# Patient Record
Sex: Male | Born: 1940
Health system: Southern US, Community
[De-identification: ages and names within clinical notes are randomized; demographics above are authoritative.]

## PROBLEM LIST (undated history)

## (undated) DIAGNOSIS — H269 Unspecified cataract: Secondary | ICD-10-CM

## (undated) DIAGNOSIS — I255 Ischemic cardiomyopathy: Secondary | ICD-10-CM

## (undated) DIAGNOSIS — E785 Hyperlipidemia, unspecified: Secondary | ICD-10-CM

## (undated) DIAGNOSIS — F329 Major depressive disorder, single episode, unspecified: Secondary | ICD-10-CM

## (undated) DIAGNOSIS — I10 Essential (primary) hypertension: Secondary | ICD-10-CM

## (undated) DIAGNOSIS — T7840XA Allergy, unspecified, initial encounter: Secondary | ICD-10-CM

## (undated) DIAGNOSIS — G4700 Insomnia, unspecified: Secondary | ICD-10-CM

## (undated) DIAGNOSIS — I2510 Atherosclerotic heart disease of native coronary artery without angina pectoris: Secondary | ICD-10-CM

## (undated) DIAGNOSIS — I4891 Unspecified atrial fibrillation: Secondary | ICD-10-CM

## (undated) DIAGNOSIS — R0981 Nasal congestion: Secondary | ICD-10-CM

## (undated) DIAGNOSIS — F32A Depression, unspecified: Secondary | ICD-10-CM

## (undated) HISTORY — DX: Essential (primary) hypertension: I10

## (undated) HISTORY — DX: Major depressive disorder, single episode, unspecified: F32.9

## (undated) HISTORY — PX: CORONARY ANGIOPLASTY: SHX604

## (undated) HISTORY — DX: Unspecified atrial fibrillation: I48.91

## (undated) HISTORY — PX: OTHER SURGICAL HISTORY: SHX169

## (undated) HISTORY — DX: Atherosclerotic heart disease of native coronary artery without angina pectoris: I25.10

## (undated) HISTORY — DX: Hyperlipidemia, unspecified: E78.5

## (undated) HISTORY — DX: Insomnia, unspecified: G47.00

## (undated) HISTORY — DX: Depression, unspecified: F32.A

## (undated) HISTORY — PX: ROTATOR CUFF REPAIR: SHX139

## (undated) HISTORY — PX: APPENDECTOMY: SHX54

## (undated) HISTORY — DX: Nasal congestion: R09.81

## (undated) HISTORY — DX: Allergy, unspecified, initial encounter: T78.40XA

## (undated) HISTORY — DX: Ischemic cardiomyopathy: I25.5

## (undated) HISTORY — DX: Unspecified cataract: H26.9

---

## 2004-05-16 ENCOUNTER — Inpatient Hospital Stay (HOSPITAL_COMMUNITY): Admission: EM | Admit: 2004-05-16 | Discharge: 2004-05-19 | Payer: Self-pay | Admitting: Emergency Medicine

## 2004-06-14 ENCOUNTER — Ambulatory Visit: Payer: Self-pay | Admitting: Cardiology

## 2004-07-12 ENCOUNTER — Ambulatory Visit: Payer: Self-pay | Admitting: Internal Medicine

## 2004-08-17 ENCOUNTER — Ambulatory Visit: Payer: Self-pay | Admitting: Internal Medicine

## 2004-10-15 ENCOUNTER — Ambulatory Visit: Payer: Self-pay | Admitting: Internal Medicine

## 2004-10-22 ENCOUNTER — Ambulatory Visit: Payer: Self-pay | Admitting: Internal Medicine

## 2005-01-22 ENCOUNTER — Ambulatory Visit: Payer: Self-pay | Admitting: Internal Medicine

## 2005-04-24 ENCOUNTER — Ambulatory Visit: Payer: Self-pay | Admitting: Internal Medicine

## 2005-09-02 ENCOUNTER — Ambulatory Visit: Payer: Self-pay | Admitting: Internal Medicine

## 2005-10-17 ENCOUNTER — Ambulatory Visit: Payer: Self-pay | Admitting: Internal Medicine

## 2006-01-21 ENCOUNTER — Ambulatory Visit: Payer: Self-pay | Admitting: Internal Medicine

## 2006-05-01 ENCOUNTER — Ambulatory Visit: Payer: Self-pay | Admitting: Internal Medicine

## 2006-06-05 ENCOUNTER — Ambulatory Visit: Payer: Self-pay | Admitting: Internal Medicine

## 2006-07-18 ENCOUNTER — Ambulatory Visit: Payer: Self-pay | Admitting: Internal Medicine

## 2006-08-07 ENCOUNTER — Ambulatory Visit: Payer: Self-pay | Admitting: Internal Medicine

## 2006-09-25 ENCOUNTER — Ambulatory Visit: Payer: Self-pay | Admitting: Internal Medicine

## 2006-11-28 ENCOUNTER — Ambulatory Visit: Payer: Self-pay | Admitting: Internal Medicine

## 2006-11-28 LAB — CONVERTED CEMR LAB
ALT: 58 units/L — ABNORMAL HIGH (ref 0–40)
AST: 42 units/L — ABNORMAL HIGH (ref 0–37)
Albumin: 4.1 g/dL (ref 3.5–5.2)
Alkaline Phosphatase: 53 units/L (ref 39–117)
BUN: 11 mg/dL (ref 6–23)
Bilirubin, Direct: 0.1 mg/dL (ref 0.0–0.3)
CO2: 32 meq/L (ref 19–32)
Calcium: 9.8 mg/dL (ref 8.4–10.5)
Chloride: 105 meq/L (ref 96–112)
Cholesterol: 163 mg/dL (ref 0–200)
Creatinine, Ser: 1 mg/dL (ref 0.4–1.5)
GFR calc Af Amer: 96 mL/min
GFR calc non Af Amer: 79 mL/min
Glucose, Bld: 113 mg/dL — ABNORMAL HIGH (ref 70–99)
HDL: 33.8 mg/dL — ABNORMAL LOW (ref >39.0)
LDL Cholesterol: 102 mg/dL — ABNORMAL HIGH (ref 0–99)
Potassium: 5.4 meq/L — ABNORMAL HIGH (ref 3.5–5.1)
Sodium: 143 meq/L (ref 135–145)
TSH: 1.52 microintl units/mL (ref 0.35–5.50)
Total Bilirubin: 1 mg/dL (ref 0.3–1.2)
Total CHOL/HDL Ratio: 4.8
Total Protein: 7.3 g/dL (ref 6.0–8.3)
Triglycerides: 136 mg/dL (ref 0–149)
VLDL: 27 mg/dL (ref 0–40)

## 2007-02-17 DIAGNOSIS — I2510 Atherosclerotic heart disease of native coronary artery without angina pectoris: Secondary | ICD-10-CM

## 2007-02-17 DIAGNOSIS — K760 Fatty (change of) liver, not elsewhere classified: Secondary | ICD-10-CM

## 2007-02-17 DIAGNOSIS — I10 Essential (primary) hypertension: Secondary | ICD-10-CM

## 2007-02-17 DIAGNOSIS — G5600 Carpal tunnel syndrome, unspecified upper limb: Secondary | ICD-10-CM | POA: Insufficient documentation

## 2007-02-17 DIAGNOSIS — E785 Hyperlipidemia, unspecified: Secondary | ICD-10-CM

## 2007-03-03 ENCOUNTER — Ambulatory Visit: Payer: Self-pay | Admitting: Internal Medicine

## 2007-03-03 DIAGNOSIS — G4700 Insomnia, unspecified: Secondary | ICD-10-CM | POA: Insufficient documentation

## 2007-03-03 DIAGNOSIS — F329 Major depressive disorder, single episode, unspecified: Secondary | ICD-10-CM

## 2007-03-03 DIAGNOSIS — R740 Nonspecific elevation of levels of transaminase and lactic acid dehydrogenase [LDH]: Secondary | ICD-10-CM

## 2007-03-03 LAB — CONVERTED CEMR LAB
ALT: 58 U/L — ABNORMAL HIGH
AST: 41 U/L — ABNORMAL HIGH
Albumin: 4.1 g/dL
Alkaline Phosphatase: 50 U/L
Bilirubin, Direct: 0.2 mg/dL
Hgb A1c MFr Bld: 5.8 %
Total Bilirubin: 1.1 mg/dL
Total Protein: 7.1 g/dL

## 2007-06-04 ENCOUNTER — Ambulatory Visit: Payer: Self-pay | Admitting: Internal Medicine

## 2007-06-04 LAB — CONVERTED CEMR LAB
ALT: 33 units/L (ref 0–53)
AST: 36 units/L (ref 0–37)
Albumin: 4.4 g/dL (ref 3.5–5.2)
Alkaline Phosphatase: 58 units/L (ref 39–117)
BUN: 12 mg/dL (ref 6–23)
Bilirubin, Direct: 0.2 mg/dL (ref 0.0–0.3)
CO2: 31 meq/L (ref 19–32)
Calcium: 10 mg/dL (ref 8.4–10.5)
Chloride: 103 meq/L (ref 96–112)
Cholesterol, target level: 200 mg/dL
Cholesterol: 138 mg/dL (ref 0–200)
Creatinine, Ser: 1 mg/dL (ref 0.4–1.5)
GFR calc Af Amer: 96 mL/min
GFR calc non Af Amer: 79 mL/min
Glucose, Bld: 94 mg/dL (ref 70–99)
HDL goal, serum: 40 mg/dL
HDL: 29.3 mg/dL — ABNORMAL LOW (ref >39.0)
LDL Cholesterol: 92 mg/dL (ref 0–99)
LDL Goal: 100 mg/dL
Potassium: 4.9 meq/L (ref 3.5–5.1)
Sodium: 139 meq/L (ref 135–145)
Total Bilirubin: 0.8 mg/dL (ref 0.3–1.2)
Total CHOL/HDL Ratio: 4.7
Total Protein: 7.1 g/dL (ref 6.0–8.3)
Triglycerides: 82 mg/dL (ref 0–149)
VLDL: 16 mg/dL (ref 0–40)

## 2007-09-08 ENCOUNTER — Telehealth (INDEPENDENT_AMBULATORY_CARE_PROVIDER_SITE_OTHER): Payer: Self-pay | Admitting: *Deleted

## 2007-09-15 ENCOUNTER — Ambulatory Visit: Payer: Self-pay | Admitting: Internal Medicine

## 2007-09-15 LAB — CONVERTED CEMR LAB
ALT: 20 units/L (ref 0–53)
AST: 26 units/L (ref 0–37)
Albumin: 3.7 g/dL (ref 3.5–5.2)
Alkaline Phosphatase: 42 units/L (ref 39–117)
Bilirubin, Direct: 0.2 mg/dL (ref 0.0–0.3)
Cholesterol: 123 mg/dL (ref 0–200)
HDL: 33.6 mg/dL — ABNORMAL LOW (ref >39.0)
Hgb A1c MFr Bld: 5.8 % (ref 4.6–6.0)
LDL Cholesterol: 79 mg/dL (ref 0–99)
Total Bilirubin: 0.8 mg/dL (ref 0.3–1.2)
Total CHOL/HDL Ratio: 3.7
Total Protein: 6.4 g/dL (ref 6.0–8.3)
Triglycerides: 52 mg/dL (ref 0–149)
VLDL: 10 mg/dL (ref 0–40)

## 2007-09-22 ENCOUNTER — Ambulatory Visit: Payer: Self-pay | Admitting: Internal Medicine

## 2008-01-28 ENCOUNTER — Ambulatory Visit: Payer: Self-pay | Admitting: Internal Medicine

## 2008-05-26 ENCOUNTER — Ambulatory Visit: Payer: Self-pay | Admitting: Internal Medicine

## 2008-05-26 LAB — CONVERTED CEMR LAB
ALT: 26 units/L (ref 0–53)
AST: 29 units/L (ref 0–37)
Albumin: 4.1 g/dL (ref 3.5–5.2)
Alkaline Phosphatase: 48 units/L (ref 39–117)
BUN: 14 mg/dL (ref 6–23)
Bilirubin, Direct: 0.1 mg/dL (ref 0.0–0.3)
CO2: 30 meq/L (ref 19–32)
Calcium: 9.8 mg/dL (ref 8.4–10.5)
Chloride: 104 meq/L (ref 96–112)
Cholesterol: 148 mg/dL (ref 0–200)
Creatinine, Ser: 1 mg/dL (ref 0.4–1.5)
GFR calc Af Amer: 96 mL/min
GFR calc non Af Amer: 79 mL/min
Glucose, Bld: 98 mg/dL (ref 70–99)
HDL: 34.6 mg/dL — ABNORMAL LOW (ref >39.0)
LDL Cholesterol: 100 mg/dL — ABNORMAL HIGH (ref 0–99)
PSA: 0.51 ng/mL (ref 0.10–4.00)
Potassium: 5.5 meq/L — ABNORMAL HIGH (ref 3.5–5.1)
Sodium: 141 meq/L (ref 135–145)
TSH: 1.03 microintl units/mL (ref 0.35–5.50)
Total Bilirubin: 1.1 mg/dL (ref 0.3–1.2)
Total CHOL/HDL Ratio: 4.3
Total Protein: 7.3 g/dL (ref 6.0–8.3)
Triglycerides: 66 mg/dL (ref 0–149)
VLDL: 13 mg/dL (ref 0–40)

## 2008-06-13 ENCOUNTER — Ambulatory Visit: Payer: Self-pay | Admitting: Internal Medicine

## 2008-12-05 ENCOUNTER — Ambulatory Visit: Payer: Self-pay | Admitting: Internal Medicine

## 2008-12-05 LAB — CONVERTED CEMR LAB
ALT: 22 units/L (ref 0–53)
AST: 26 units/L (ref 0–37)
Albumin: 3.8 g/dL (ref 3.5–5.2)
Alkaline Phosphatase: 37 units/L — ABNORMAL LOW (ref 39–117)
Bilirubin, Direct: 0 mg/dL (ref 0.0–0.3)
Cholesterol: 134 mg/dL (ref 0–200)
HDL: 31.7 mg/dL — ABNORMAL LOW (ref >39.00)
Hgb A1c MFr Bld: 5.7 % (ref 4.6–6.5)
LDL Cholesterol: 89 mg/dL (ref 0–99)
Total Bilirubin: 0.7 mg/dL (ref 0.3–1.2)
Total CHOL/HDL Ratio: 4
Total Protein: 6.9 g/dL (ref 6.0–8.3)
Triglycerides: 65 mg/dL (ref 0.0–149.0)
VLDL: 13 mg/dL (ref 0.0–40.0)

## 2008-12-12 ENCOUNTER — Ambulatory Visit: Payer: Self-pay | Admitting: Internal Medicine

## 2009-03-27 ENCOUNTER — Ambulatory Visit: Payer: Self-pay | Admitting: Internal Medicine

## 2009-03-27 LAB — CONVERTED CEMR LAB
ALT: 23 units/L (ref 0–53)
AST: 28 units/L (ref 0–37)
Albumin: 4 g/dL (ref 3.5–5.2)
Alkaline Phosphatase: 44 units/L (ref 39–117)
Bilirubin, Direct: 0 mg/dL (ref 0.0–0.3)
Total Bilirubin: 0.9 mg/dL (ref 0.3–1.2)
Total Protein: 7 g/dL (ref 6.0–8.3)

## 2009-06-28 ENCOUNTER — Ambulatory Visit: Payer: Self-pay | Admitting: Internal Medicine

## 2009-06-28 LAB — CONVERTED CEMR LAB
ALT: 22 units/L (ref 0–53)
AST: 24 units/L (ref 0–37)
Albumin: 3.8 g/dL (ref 3.5–5.2)
Alkaline Phosphatase: 44 units/L (ref 39–117)
Bilirubin, Direct: 0 mg/dL (ref 0.0–0.3)
Cholesterol: 143 mg/dL (ref 0–200)
Direct LDL: 99.3 mg/dL
HDL: 36.3 mg/dL — ABNORMAL LOW (ref >39.00)
TSH: 0.9 microintl units/mL (ref 0.35–5.50)
Total Bilirubin: 0.8 mg/dL (ref 0.3–1.2)
Total Protein: 7 g/dL (ref 6.0–8.3)

## 2009-10-30 ENCOUNTER — Ambulatory Visit: Payer: Self-pay | Admitting: Internal Medicine

## 2010-03-26 ENCOUNTER — Telehealth: Payer: Self-pay | Admitting: Internal Medicine

## 2010-04-26 ENCOUNTER — Ambulatory Visit: Payer: Self-pay | Admitting: Internal Medicine

## 2010-04-26 LAB — CONVERTED CEMR LAB
ALT: 27 units/L (ref 0–53)
AST: 38 units/L — ABNORMAL HIGH (ref 0–37)
Albumin: 3.8 g/dL (ref 3.5–5.2)
Alkaline Phosphatase: 41 units/L (ref 39–117)
BUN: 16 mg/dL (ref 6–23)
Bilirubin, Direct: 0.2 mg/dL (ref 0.0–0.3)
CO2: 26 meq/L (ref 19–32)
Calcium: 8.8 mg/dL (ref 8.4–10.5)
Chloride: 101 meq/L (ref 96–112)
Cholesterol: 134 mg/dL (ref 0–200)
Creatinine, Ser: 1 mg/dL (ref 0.4–1.5)
GFR calc non Af Amer: 80.27 mL/min (ref >60)
Glucose, Bld: 94 mg/dL (ref 70–99)
HDL: 35.1 mg/dL — ABNORMAL LOW (ref >39.00)
LDL Cholesterol: 89 mg/dL (ref 0–99)
PSA: 0.48 ng/mL (ref 0.10–4.00)
Potassium: 4.4 meq/L (ref 3.5–5.1)
Sodium: 135 meq/L (ref 135–145)
TSH: 0.73 microintl units/mL (ref 0.35–5.50)
Total Bilirubin: 0.7 mg/dL (ref 0.3–1.2)
Total CHOL/HDL Ratio: 4
Total Protein: 6.3 g/dL (ref 6.0–8.3)
Triglycerides: 51 mg/dL (ref 0.0–149.0)
VLDL: 10.2 mg/dL (ref 0.0–40.0)

## 2010-05-03 ENCOUNTER — Ambulatory Visit: Payer: Self-pay | Admitting: Internal Medicine

## 2010-08-30 ENCOUNTER — Ambulatory Visit: Admit: 2010-08-30 | Payer: Self-pay | Admitting: Internal Medicine

## 2010-09-06 NOTE — Progress Notes (Signed)
Summary: infected tooth  Phone Note Call from Patient   Caller: Patient Call For: Stacie Glaze MD Summary of Call: Pt wants an antibiotic for infected tooth. [phone] [facility] Initial call taken by: Lynann Beaver CMA,  March 26, 2010 3:34 PM  Follow-up for Phone Call        amoxil 500 three times a day for 10 days Follow-up by: Stacie Glaze MD,  March 27, 2010 9:42 AM    New/Updated Medications: AMOXICILLIN 500 MG CAPS (AMOXICILLIN) one by mouth three times a day x 10 days Prescriptions: AMOXICILLIN 500 MG CAPS (AMOXICILLIN) one by mouth three times a day x 10 days  #30 x 0   Entered by:   Lynann Beaver CMA   Authorized by:   Stacie Glaze MD   Signed by:   Lynann Beaver CMA on 03/27/2010   Method used:   Electronically to        [facility] (retail)       [address]       [place], [place]  [address]       Ph: [phone]       Fax: [phone]   RxID:   [id number]  Notified pt.

## 2010-09-06 NOTE — Assessment & Plan Note (Signed)
Summary: 6 MTH ROV // RS/PT [facility] FROM BMP/CJR   Vital Signs:  Patient profile:   70 year old male Height:      69 inches Weight:      157 pounds BMI:     23.27 Temp:     98.2 degrees F oral Pulse rate:   64 / minute Resp:     14 per minute BP sitting:   136 / 80  (left arm)  Vitals Entered By: Willy Eddy, LPN (May 03, 2010 10:27 AM) CC: roa labs, Hypertension Management Is Patient Diabetic? No   Primary Care Arbie Reisz:  Stacie Glaze MD  CC:  roa labs and Hypertension Management.  History of Present Illness: folow up of HTN and lipids with weight loss and exercize he is at his ideal weight and feels well he is still on imdue and norvasc and a BB and has not noted any chest discomfort no lightheadedness or dizzyness has persitant sinus problems with allergies and taked as needed loratidine. monitering of LFTs shows improvement weigth loss has been key  Pt also is due the medicare wellness exam Here for Medicare AWV:  1.   Risk factors based on Past M, S, F history:  reveiwed and major risks are CAD due to lipids and HTN, currently acute risks are lower due to bing at goals fro these problems 2.   Physical Activities:  walks up to 8 miles a day 3.   Depression/mood:  no dression detected on questoning 4.   Hearing: heqrs whispered voice at 6 feer 5.   ADL's:  poerforms all activities of daily living 6.   Fall Risk:  none noted and no hx of falls 7.   Home Safety:  no increased risks noted 8.   Height, weight, &visual acuity: in chart 9.   Counseling:  based on CV risks counsiclling about diet, exercise and med abherance his insurance has attempted to change crestor to zocar but this is  not possible due to the amlodipine 10.   Labs ordered based on risk factors:  ordered prior to visit 11.           Referral Coordination  none needed today 12.           Care Plan reviewed immunizations and set ROV see care plan sheet scanned into EMR 13.            Cognitive  Assessment Alert and oriented, good judement, memory intact for short term recall and able to perform simple calcilations such as balancing a check book   Hypertension History:      He denies headache, chest pain, palpitations, dyspnea with exertion, orthopnea, PND, peripheral edema, visual symptoms, neurologic problems, syncope, and side effects from treatment.        Positive major cardiovascular risk factors include male age 26 years old or older, hyperlipidemia, and hypertension.  Negative major cardiovascular risk factors include non-tobacco-user status.        Positive history for target organ damage include ASHD (either angina/prior MI/prior CABG).  Further assessment for target organ damage reveals no history of stroke/TIA or peripheral vascular disease.     Preventive Screening-Counseling & Management  Alcohol-Tobacco     Smoking Status: quit     Year Quit: 1990     Passive Smoke Exposure: no     Tobacco Counseling: to remain off tobacco products  Problems Prior to Update: 1)  Preventive Health Care  (ICD-V70.0) 2)  Elevation, Transaminase/ldh Levels  (  ICD-790.4) 3)  Family History Diabetes 1st Degree Relative  (ICD-V18.0) 4)  Insomnia, Persistent  (ICD-307.42) 5)  Depression  (ICD-311) 6)  Syndrome, Carpal Tunnel  (ICD-354.0) 7)  Fatty Liver Disease  (ICD-571.8) 8)  Hypertension  (ICD-401.9) 9)  Hyperlipidemia  (ICD-272.4) 10)  Coronary Artery Disease  (ICD-414.00)  Current Problems (verified): 1)  Elevation, Transaminase/ldh Levels  (ICD-790.4) 2)  Family History Diabetes 1st Degree Relative  (ICD-V18.0) 3)  Insomnia, Persistent  (ICD-307.42) 4)  Depression  (ICD-311) 5)  Syndrome, Carpal Tunnel  (ICD-354.0) 6)  Fatty Liver Disease  (ICD-571.8) 7)  Hypertension  (ICD-401.9) 8)  Hyperlipidemia  (ICD-272.4) 9)  Coronary Artery Disease  (ICD-414.00)  Medications Prior to Update: 1)  Nadolol 40 Mg  Tabs (Nadolol) .... One By Mouth Daily 2)  Norvasc 5 Mg  Tabs  (Amlodipine Besylate) .... One By Mouth Daily 3)  Crestor 10 Mg  Tabs (Rosuvastatin Calcium) .... One By Mouth Daily 4)  Adult Aspirin Low Strength 81 Mg  Tbdp (Aspirin) .... Once Daily 5)  Niacin Flush Free 500 Mg  Caps (Inositol Niacinate) .... 2 Qd 6)  Zolpidem Tartrate 10 Mg Tabs (Zolpidem Tartrate) .... One By Mouth Q Hs 7)  Isosorbide Mononitrate Cr 60 Mg Xr24h-Tab (Isosorbide Mononitrate) .Marland Kitchen.. 1 Once Daily 8)  Amoxicillin 500 Mg Caps (Amoxicillin) .... One By Mouth Three Times A Day X 10 Days  Current Medications (verified): 1)  Nadolol 40 Mg  Tabs (Nadolol) .... One By Mouth Daily 2)  Norvasc 5 Mg  Tabs (Amlodipine Besylate) .... One By Mouth Daily 3)  Crestor 10 Mg  Tabs (Rosuvastatin Calcium) .... One By Mouth Daily 4)  Adult Aspirin Low Strength 81 Mg  Tbdp (Aspirin) .... Once Daily 5)  Niacin Flush Free 500 Mg  Caps (Inositol Niacinate) .... 2 Qd 6)  Zolpidem Tartrate 10 Mg Tabs (Zolpidem Tartrate) .... One By Mouth Q Hs 7)  Isosorbide Mononitrate Cr 60 Mg Xr24h-Tab (Isosorbide Mononitrate) .Marland Kitchen.. 1 Once Daily  Allergies (verified): No Known Drug Allergies  Past History:  Family History: Last updated: 06-29-2007 father died due to complications of DM Family History Diabetes 1st degree relative Family History of Stroke M 1st degree relative <50 Mother diesd of age at 56  Social History: Last updated: 03/03/2007 Retired Married Former Smoker quit 20 years ago  Risk Factors: Smoking Status: quit (05/03/2010) Passive Smoke Exposure: no (05/03/2010)  Past medical, surgical, family and social histories (including risk factors) reviewed, and no changes noted (except as noted below).  Past Medical History: Reviewed history from 03/03/2007 and no changes required. Coronary artery disease Hyperlipidemia Hypertension Depression insomnia  Past Surgical History: Reviewed history from 03/03/2007 and no changes required. Appendectomy undescended testicle  sugrery  Family History: Reviewed history from Jun 29, 2007 and no changes required. father died due to complications of DM Family History Diabetes 1st degree relative Family History of Stroke M 1st degree relative <50 Mother diesd of age at 13  Social History: Reviewed history from 03/03/2007 and no changes required. Retired Married Former Smoker quit 20 years ago  Review of Systems  The patient denies anorexia, fever, weight loss, weight gain, vision loss, decreased hearing, hoarseness, chest pain, syncope, dyspnea on exertion, peripheral edema, prolonged cough, headaches, hemoptysis, abdominal pain, melena, hematochezia, severe indigestion/heartburn, hematuria, incontinence, genital sores, muscle weakness, suspicious skin lesions, transient blindness, difficulty walking, depression, unusual weight change, abnormal bleeding, enlarged lymph nodes, angioedema, breast masses, and testicular masses.         Flu Vaccine Consent  Questions     Do you have a history of severe allergic reactions to this vaccine? no    Any prior history of allergic reactions to egg and/or gelatin? no    Do you have a sensitivity to the preservative Thimersol? no    Do you have a past history of Guillan-Barre Syndrome? no    Do you currently have an acute febrile illness? no    Have you ever had a severe reaction to latex? no    Vaccine information given and explained to patient? yes    Are you currently pregnant? no    Lot Number:[id number]   Exp Date:02/02/2011   Site Given  Left Deltoid IM   Physical Exam  General:  Well-developed,well-nourished,in no acute distress; alert,appropriate and cooperative throughout examination Head:  atraumatic.   Eyes:  pupils equal and pupils round.   Ears:  R ear normal and L ear normal.   Nose:  no external deformity.   Mouth:  pharynx pink and moist and fair dentition.   Neck:  No deformities, masses, or tenderness noted. Lungs:  normal respiratory effort and no  wheezes.   Heart:  normal rate and regular rhythm.   Abdomen:  soft, non-tender, and no masses.   Prostate:  no nodules and 1+ enlarged.     Impression & Recommendations:  Problem # 1:  PREVENTIVE HEALTH CARE (ICD-V70.0) The pt was asked about all immunizations, health maint. services that are appropriate to their age and was given guidance on diet exercize  and weight management  Orders: Medicare -1st Annual Wellness Visit [phone])  Flu Vax: Fluvax 3+ (05/03/2010)   Pneumovax: Pneumovax (06/04/2007) Chol: 134 (04/26/2010)   HDL: 35.10 (04/26/2010)   LDL: 89 (04/26/2010)   TG: 51.0 (04/26/2010) TSH: 0.73 (04/26/2010)   HgbA1C: 5.7 (12/05/2008)   PSA: 0.48 (04/26/2010)  Discussed using sunscreen, use of alcohol, drug use, self testicular exam, routine dental care, routine eye care, routine physical exam, seat belts, multiple vitamins, osteoporosis prevention, adequate calcium intake in diet, and recommendations for immunizations.  Discussed exercise and checking cholesterol.  Discussed gun safety, safe sex, and contraception. Also recommend checking PSA.  Problem # 2:  HYPERTENSION (ICD-401.9) Assessment: Unchanged  His updated medication list for this problem includes:    Nadolol 40 Mg Tabs (Nadolol) ..... One by mouth daily    Norvasc 5 Mg Tabs (Amlodipine besylate) ..... One by mouth daily  BP today: 136/80 Prior BP: 110/70 (10/30/2009)  Prior 10 Yr Risk Heart Disease: N/A (06/04/2007)  Labs Reviewed: K+: 4.4 (04/26/2010) Creat: : 1.0 (04/26/2010)   Chol: 134 (04/26/2010)   HDL: 35.10 (04/26/2010)   LDL: 89 (04/26/2010)   TG: 51.0 (04/26/2010)  Problem # 3:  HYPERLIPIDEMIA (ICD-272.4) Assessment: Unchanged  His updated medication list for this problem includes:    Crestor 10 Mg Tabs (Rosuvastatin calcium) ..... One by mouth daily  Labs Reviewed: SGOT: 38 (04/26/2010)   SGPT: 27 (04/26/2010)  Lipid Goals: Chol Goal: 200 (06/04/2007)   HDL Goal: 40 (06/04/2007)   LDL  Goal: 100 (06/04/2007)   TG Goal: 150 (06/04/2007)  Prior 10 Yr Risk Heart Disease: N/A (06/04/2007)   HDL:35.10 (04/26/2010), 36.30 (06/28/2009)  LDL:89 (04/26/2010), 89 (12/05/2008)  Chol:134 (04/26/2010), 143 (06/28/2009)  Trig:51.0 (04/26/2010), 65.0 (12/05/2008)  Problem # 4:  CORONARY ARTERY DISEASE (ICD-414.00) Assessment: Unchanged no chest pains His updated medication list for this problem includes:    Nadolol 40 Mg Tabs (Nadolol) ..... One by mouth daily    Norvasc 5  Mg Tabs (Amlodipine besylate) ..... One by mouth daily    Adult Aspirin Low Strength 81 Mg Tbdp (Aspirin) ..... Once daily    Isosorbide Mononitrate Cr 60 Mg Xr24h-tab (Isosorbide mononitrate) .Marland Kitchen... 1 once daily  Labs Reviewed: Chol: 134 (04/26/2010)   HDL: 35.10 (04/26/2010)   LDL: 89 (04/26/2010)   TG: 51.0 (04/26/2010)  Lipid Goals: Chol Goal: 200 (06/04/2007)   HDL Goal: 40 (06/04/2007)   LDL Goal: 100 (06/04/2007)   TG Goal: 150 (06/04/2007)  Problem # 5:  FATTY LIVER DISEASE (ICD-571.8) Assessment: Improved lfts stable weigh losst has improvedthis issue  Complete Medication List: 1)  Nadolol 40 Mg Tabs (Nadolol) .... One by mouth daily 2)  Norvasc 5 Mg Tabs (Amlodipine besylate) .... One by mouth daily 3)  Crestor 10 Mg Tabs (Rosuvastatin calcium) .... One by mouth daily 4)  Adult Aspirin Low Strength 81 Mg Tbdp (Aspirin) .... Once daily 5)  Niacin Flush Free 500 Mg Caps (Inositol niacinate) .... 2 qd 6)  Zolpidem Tartrate 10 Mg Tabs (Zolpidem tartrate) .... One by mouth q hs 7)  Isosorbide Mononitrate Cr 60 Mg Xr24h-tab (Isosorbide mononitrate) .Marland Kitchen.. 1 once daily  Other Orders: Flu Vaccine 50yrs + MEDICARE PATIENTS (G4010) Administration Flu vaccine - MCR (U7253)  Hypertension Assessment/Plan:      The patient's hypertensive risk group is category C: Target organ damage and/or diabetes.  Today's blood pressure is 136/80.  His blood pressure goal is < 140/90.  Patient Instructions: 1)  Please  schedule a follow-up appointment in 4 months. 2)  Personalized care plan to included calling insurnce about shingle vaccine, all other health maintanance aqnd screening tests up to date

## 2010-09-06 NOTE — Assessment & Plan Note (Signed)
Summary: 4 month rov/njr   Vital Signs:  Patient profile:   70 year old male Height:      69 inches Weight:      159 pounds BMI:     23.57 Temp:     98.2 degrees F oral Pulse rate:   64 / minute Resp:     14 per minute BP sitting:   110 / 70  (left arm)  Vitals Entered By: Willy Eddy, LPN (October 30, 2009 8:06 AM) CC: roa, Hypertension Management, Lipid Management   CC:  roa, Hypertension Management, and Lipid Management.  History of Present Illness: Mood is good weight is satble no chaest pain wit hx of CAD and  lipids and HTYn is at goal  Hypertension History:      He denies headache, chest pain, palpitations, dyspnea with exertion, orthopnea, PND, peripheral edema, visual symptoms, neurologic problems, syncope, and side effects from treatment.  He notes no problems with any antihypertensive medication side effects.  at goal.        Positive major cardiovascular risk factors include male age 32 years old or older, hyperlipidemia, and hypertension.  Negative major cardiovascular risk factors include non-tobacco-user status.        Positive history for target organ damage include ASHD (either angina/prior MI/prior CABG).  Further assessment for target organ damage reveals no history of stroke/TIA or peripheral vascular disease.    Lipid Management History:      Positive NCEP/ATP III risk factors include male age 57 years old or older, HDL cholesterol less than 40, hypertension, and ASHD (either angina/prior MI/prior CABG).  Negative NCEP/ATP III risk factors include non-tobacco-user status, no prior stroke/TIA, no peripheral vascular disease, and no history of aortic aneurysm.      Preventive Screening-Counseling & Management  Alcohol-Tobacco     Smoking Status: quit     Year Quit: 1990     Passive Smoke Exposure: no  Problems Prior to Update: 1)  Elevation, Transaminase/ldh Levels  (ICD-790.4) 2)  Family History Diabetes 1st Degree Relative  (ICD-V18.0) 3)   Insomnia, Persistent  (ICD-307.42) 4)  Depression  (ICD-311) 5)  Syndrome, Carpal Tunnel  (ICD-354.0) 6)  Fatty Liver Disease  (ICD-571.8) 7)  Hypertension  (ICD-401.9) 8)  Hyperlipidemia  (ICD-272.4) 9)  Coronary Artery Disease  (ICD-414.00)  Current Problems (verified): 1)  Elevation, Transaminase/ldh Levels  (ICD-790.4) 2)  Family History Diabetes 1st Degree Relative  (ICD-V18.0) 3)  Insomnia, Persistent  (ICD-307.42) 4)  Depression  (ICD-311) 5)  Syndrome, Carpal Tunnel  (ICD-354.0) 6)  Fatty Liver Disease  (ICD-571.8) 7)  Hypertension  (ICD-401.9) 8)  Hyperlipidemia  (ICD-272.4) 9)  Coronary Artery Disease  (ICD-414.00)  Medications Prior to Update: 1)  Nadolol 40 Mg  Tabs (Nadolol) .... One By Mouth Daily 2)  Norvasc 5 Mg  Tabs (Amlodipine Besylate) .... One By Mouth Daily 3)  Crestor 10 Mg  Tabs (Rosuvastatin Calcium) .... One By Mouth Daily 4)  Adult Aspirin Low Strength 81 Mg  Tbdp (Aspirin) .... Once Daily 5)  Niacin Flush Free 500 Mg  Caps (Inositol Niacinate) .... 2 Qd 6)  Zolpidem Tartrate 10 Mg Tabs (Zolpidem Tartrate) .... One By Mouth Q Hs 7)  Isosorbide Mononitrate Cr 60 Mg Xr24h-Tab (Isosorbide Mononitrate) .Marland Kitchen.. 1 Once Daily  Current Medications (verified): 1)  Nadolol 40 Mg  Tabs (Nadolol) .... One By Mouth Daily 2)  Norvasc 5 Mg  Tabs (Amlodipine Besylate) .... One By Mouth Daily 3)  Crestor 10 Mg  Tabs (  Rosuvastatin Calcium) .... One By Mouth Daily 4)  Adult Aspirin Low Strength 81 Mg  Tbdp (Aspirin) .... Once Daily 5)  Niacin Flush Free 500 Mg  Caps (Inositol Niacinate) .... 2 Qd 6)  Zolpidem Tartrate 10 Mg Tabs (Zolpidem Tartrate) .... One By Mouth Q Hs 7)  Isosorbide Mononitrate Cr 60 Mg Xr24h-Tab (Isosorbide Mononitrate) .Marland Kitchen.. 1 Once Daily  Allergies (verified): No Known Drug Allergies  Past History:  Family History: Last updated: June 17, 2007 father died due to complications of DM Family History Diabetes 1st degree relative Family History of Stroke  M 1st degree relative <50 Mother diesd of age at 11  Social History: Last updated: 03/03/2007 Retired Married Former Smoker quit 20 years ago  Risk Factors: Smoking Status: quit (10/30/2009) Passive Smoke Exposure: no (10/30/2009)  Past medical, surgical, family and social histories (including risk factors) reviewed, and no changes noted (except as noted below).  Past Medical History: Reviewed history from 03/03/2007 and no changes required. Coronary artery disease Hyperlipidemia Hypertension Depression insomnia  Past Surgical History: Reviewed history from 03/03/2007 and no changes required. Appendectomy undescended testicle sugrery  Family History: Reviewed history from 06-17-07 and no changes required. father died due to complications of DM Family History Diabetes 1st degree relative Family History of Stroke M 1st degree relative <50 Mother diesd of age at 7  Social History: Reviewed history from 03/03/2007 and no changes required. Retired Married Former Smoker quit 20 years ago  Review of Systems       The patient complains of anorexia.  The patient denies fever, weight loss, weight gain, vision loss, decreased hearing, hoarseness, chest pain, syncope, dyspnea on exertion, peripheral edema, prolonged cough, headaches, hemoptysis, abdominal pain, melena, hematochezia, severe indigestion/heartburn, hematuria, incontinence, genital sores, muscle weakness, suspicious skin lesions, transient blindness, difficulty walking, depression, unusual weight change, abnormal bleeding, enlarged lymph nodes, angioedema, breast masses, and testicular masses.    Physical Exam  General:  Well-developed,well-nourished,in no acute distress; alert,appropriate and cooperative throughout examination Head:  atraumatic.   Eyes:  pupils equal and pupils round.   Ears:  R ear normal and L ear normal.   Nose:  no external deformity.   Mouth:  pharynx pink and moist and fair dentition.     Neck:  No deformities, masses, or tenderness noted. Lungs:  normal respiratory effort and no wheezes.   Heart:  normal rate and regular rhythm.   Abdomen:  soft, non-tender, and no masses.   Msk:  normal ROM.   Extremities:  No clubbing, cyanosis, edema, or deformity noted with normal full range of motion of all joints.   Neurologic:  alert & oriented X3 and strength normal in all extremities.      Impression & Recommendations:  Problem # 1:  DEPRESSION (ICD-311)  stable  Discussed treatment options, including trial of antidpressant medication. Will refer to behavioral health. Follow-up call in in 24-48 hours and recheck in 2 weeks, sooner as needed. Patient agrees to call if any worsening of symptoms or thoughts of doing harm arise. Verified that the patient has no suicidal ideation at this time.   Problem # 2:  FATTY LIVER DISEASE (ICD-571.8) monitering enzymes  Problem # 3:  HYPERTENSION (ICD-401.9)  His updated medication list for this problem includes:    Nadolol 40 Mg Tabs (Nadolol) ..... One by mouth daily    Norvasc 5 Mg Tabs (Amlodipine besylate) ..... One by mouth daily  BP today: 110/70 Prior BP: 126/70 (06/28/2009)  Prior 10 Yr Risk Heart Disease:  N/A (06/04/2007)  Labs Reviewed: K+: 5.5 (05/26/2008) Creat: : 1.0 (05/26/2008)   Chol: 143 (06/28/2009)   HDL: 36.30 (06/28/2009)   LDL: 89 (12/05/2008)   TG: 65.0 (12/05/2008)  Problem # 4:  HYPERLIPIDEMIA (ICD-272.4)  His updated medication list for this problem includes:    Crestor 10 Mg Tabs (Rosuvastatin calcium) ..... One by mouth daily  Labs Reviewed: SGOT: 24 (06/28/2009)   SGPT: 22 (06/28/2009)  Lipid Goals: Chol Goal: 200 (06/04/2007)   HDL Goal: 40 (06/04/2007)   LDL Goal: 100 (06/04/2007)   TG Goal: 150 (06/04/2007)  Prior 10 Yr Risk Heart Disease: N/A (06/04/2007)   HDL:36.30 (06/28/2009), 31.70 (12/05/2008)  LDL:89 (12/05/2008), 100 (16/05/9603)  Chol:143 (06/28/2009), 134 (12/05/2008)  Trig:65.0  (12/05/2008), 66 (05/26/2008)  Problem # 5:  CORONARY ARTERY DISEASE (ICD-414.00) no chest pain His updated medication list for this problem includes:    Nadolol 40 Mg Tabs (Nadolol) ..... One by mouth daily    Norvasc 5 Mg Tabs (Amlodipine besylate) ..... One by mouth daily    Adult Aspirin Low Strength 81 Mg Tbdp (Aspirin) ..... Once daily    Isosorbide Mononitrate Cr 60 Mg Xr24h-tab (Isosorbide mononitrate) .Marland Kitchen... 1 once daily  Complete Medication List: 1)  Nadolol 40 Mg Tabs (Nadolol) .... One by mouth daily 2)  Norvasc 5 Mg Tabs (Amlodipine besylate) .... One by mouth daily 3)  Crestor 10 Mg Tabs (Rosuvastatin calcium) .... One by mouth daily 4)  Adult Aspirin Low Strength 81 Mg Tbdp (Aspirin) .... Once daily 5)  Niacin Flush Free 500 Mg Caps (Inositol niacinate) .... 2 qd 6)  Zolpidem Tartrate 10 Mg Tabs (Zolpidem tartrate) .... One by mouth q hs 7)  Isosorbide Mononitrate Cr 60 Mg Xr24h-tab (Isosorbide mononitrate) .Marland Kitchen.. 1 once daily  Hypertension Assessment/Plan:      The patient's hypertensive risk group is category C: Target organ damage and/or diabetes.  Today's blood pressure is 110/70.  His blood pressure goal is < 140/90.  Lipid Assessment/Plan:      Based on NCEP/ATP III, the patient's risk factor category is "history of coronary disease, peripheral vascular disease, cerebrovascular disease, or aortic aneurysm".  The patient's lipid goals are as follows: Total cholesterol goal is 200; LDL cholesterol goal is 100; HDL cholesterol goal is 40; Triglyceride goal is 150.  His LDL cholesterol goal has been met.    Patient Instructions: 1)  Please schedule a follow-up appointment in 6 months. 2)  first medicare prevent visit 30 min 3)  and  4)  BMP prior to visit, ICD-9:401.90 5)  Hepatic Panel prior to visit, ICD-9:995.20 6)  Lipid Panel prior to visit, ICD-9:272.4 7)  TSH prior to visit, ICD-9:272.4 8)  PSA prior to visit, ICD-9:601.0

## 2010-11-21 ENCOUNTER — Encounter: Payer: Self-pay | Admitting: Internal Medicine

## 2010-11-30 ENCOUNTER — Encounter: Payer: Self-pay | Admitting: Internal Medicine

## 2010-11-30 ENCOUNTER — Ambulatory Visit (INDEPENDENT_AMBULATORY_CARE_PROVIDER_SITE_OTHER): Payer: Medicare Other | Admitting: Internal Medicine

## 2010-11-30 VITALS — BP 110/74 | HR 60 | Temp 98.2°F | Resp 14 | Ht 69.0 in | Wt 162.0 lb

## 2010-11-30 DIAGNOSIS — I10 Essential (primary) hypertension: Secondary | ICD-10-CM

## 2010-11-30 DIAGNOSIS — E785 Hyperlipidemia, unspecified: Secondary | ICD-10-CM

## 2010-11-30 LAB — LIPID PANEL
Cholesterol: 144 mg/dL (ref 0–200)
HDL: 36.6 mg/dL — ABNORMAL LOW (ref >39.00)
LDL Cholesterol: 93 mg/dL (ref 0–99)
Total CHOL/HDL Ratio: 4
Triglycerides: 73 mg/dL (ref 0.0–149.0)
VLDL: 14.6 mg/dL (ref 0.0–40.0)

## 2010-11-30 LAB — BASIC METABOLIC PANEL
BUN: 12 mg/dL (ref 6–23)
CO2: 30 mEq/L (ref 19–32)
Calcium: 9.5 mg/dL (ref 8.4–10.5)
Chloride: 103 mEq/L (ref 96–112)
Creatinine, Ser: 1 mg/dL (ref 0.4–1.5)
GFR: 82.07 mL/min (ref >60.00)
Glucose, Bld: 102 mg/dL — ABNORMAL HIGH (ref 70–99)
Potassium: 5.3 mEq/L — ABNORMAL HIGH (ref 3.5–5.1)
Sodium: 139 mEq/L (ref 135–145)

## 2010-11-30 MED ORDER — ZOLPIDEM TARTRATE 10 MG PO TABS: 10.0000 mg | ORAL_TABLET | Freq: Every evening | ORAL | Status: DC | PRN

## 2010-11-30 NOTE — Assessment & Plan Note (Signed)
Blood pressure is in excellent control on his current medications we should check a basic metabolic panel for monitoring potassium and renal disease

## 2010-11-30 NOTE — Assessment & Plan Note (Signed)
monitoring of lipids on crestor

## 2010-11-30 NOTE — Progress Notes (Signed)
  Subjective:    Patient ID: Martin James, male    DOB: 1940-09-29, 70 y.o.   MRN: [id number]  HPI Patient is 70 year old white male who is followed for hyperlipidemia, hypertension history of coronary artery disease and persistent insomnia.  He is requesting his Ambien be refilled today.  He denies any chest pain shortness of breath PND orthopnea states he feels well his weight is stable and he is compliant with his medications.  Patient's blood pressure is well-controlled his compliant with his medications has no side effects has a history of fatty liver disease resolved with weight loss and exercise    Review of Systems  Constitutional: Negative for fever and fatigue.  HENT: Negative for hearing loss, congestion, neck pain and postnasal drip.   Eyes: Negative for discharge, redness and visual disturbance.  Respiratory: Negative for cough, shortness of breath and wheezing.   Cardiovascular: Negative for leg swelling.  Gastrointestinal: Negative for abdominal pain, constipation and abdominal distention.  Genitourinary: Negative for urgency and frequency.  Musculoskeletal: Negative for joint swelling and arthralgias.  Skin: Negative for color change and rash.  Neurological: Negative for weakness and light-headedness.  Hematological: Negative for adenopathy.  Psychiatric/Behavioral: Negative for behavioral problems.   Past Medical History  Diagnosis Date  . CAD (coronary artery disease)   . Hyperlipidemia   . Hypertension   . Depression   . Insomnia    Past Surgical History  Procedure Date  . Appendectomy   . Undescended testicle surgery     reports that he quit smoking about 20 years ago. He does not have any smokeless tobacco history on file. He reports that he drinks about 1.2 ounces of alcohol per week. His drug history not on file. family history includes Diabetes in his father and Stroke in his father. Not on File     Objective:   Physical Exam  Constitutional: He  appears well-developed and well-nourished.  HENT:  Head: Normocephalic and atraumatic.  Eyes: Conjunctivae are normal. Pupils are equal, round, and reactive to light.  Neck: Normal range of motion. Neck supple.  Cardiovascular: Normal rate and regular rhythm.   Pulmonary/Chest: Effort normal and breath sounds normal.  Abdominal: Soft. Bowel sounds are normal.          Assessment & Plan:

## 2010-12-07 ENCOUNTER — Other Ambulatory Visit: Payer: Self-pay | Admitting: *Deleted

## 2010-12-07 MED ORDER — ZOLPIDEM TARTRATE 10 MG PO TABS: 10.0000 mg | ORAL_TABLET | Freq: Every evening | ORAL | Status: DC | PRN

## 2010-12-21 NOTE — H&P (Signed)
Martin James, Martin James                  ACCOUNT NO.:  [id number]   MEDICAL RECORD NO.:  [id number]          PATIENT TYPE:  INP   LOCATION:  6533                         FACILITY:  [facility]   PHYSICIAN:  Olga Millers, M.D. LHCDATE OF BIRTH:  [birth year]   DATE OF ADMISSION:  05/16/2004  DATE OF DISCHARGE:                                HISTORY & PHYSICAL   HISTORY OF PRESENT ILLNESS:  The patient is a pleasant 70 year old male with  past medical history of coronary disease, hypertension, hyperlipidemia, and  question fatty liver who presents with chest pain.  The patient's cardiac  history dates back to approximately 1997.  At that time he had cardiac  catheterization secondary to angina by his report, and has had multiple  stents placed.  He was previously followed by Dr. Tresa Endo but has not been  seen by a cardiologist since 2001.  He typically does not have dyspnea on  exertion, orthopnea, PND, pedal edema, palpitations, presyncope, syncope, or  exertional chest pain.  Approximately 2 weeks ago while working on a [employer], he developed severe substernal chest pain that was described as a  heaviness.  The pain radiated to his neck and to his left upper extremity.  It was similar to his symptoms prior to his previous PCI.  There was  associated shortness of breath as well as diaphoresis but there was no  vomiting.  The pain was not pleuritic or positional.  It lasted for  approximately 3-4 hours.  He did try to take a sublingual nitroglycerin but  there was no relief.  It should be noted, however, that the nitroglycerin  was greater than 43 year old.  Since then, he has had chest pain that occurs  with exertion and is relieved with rest.  This occurs with minimal  activities and his most recent episode was this morning.  At the time of the  evaluation, he is pain free.  He also noticed increased weakness and  shortness of breath with exertion.  He was seen by Dr. Lovell Sheehan and we were  asked  to further evaluate.   His medications at present include:  1.  Crestor 10 mg P.O. daily.  2.  Norvasc 10 mg p.o. daily.  3.  Toprol-XL 50 mg p.o. daily.  4.  Benicar 20 mg p.o. daily.  5.  Imdur 120 mg p.o. daily.  6.  Aspirin 81 mg p.o. daily.  7.  Niacin 500 mg p.o. daily.  8.  Vitamin C.   He has no known drug allergies.   SOCIAL HISTORY:  He has a remote history of tobacco use but none in the past  12 years.  He does not consume alcohol.   FAMILY HISTORY:  Positive for coronary artery disease.   PAST MEDICAL HISTORY:  Significant for hypertension and hyperlipidemia but  there is no diabetes mellitus.  He does have a history of coronary artery  disease as outlined in the HPI.  There is a question of a fatty liver but  apparently this has been evaluated by Dr. Lovell Sheehan previously.  He  has had  prior surgery for an undescended testicle.  He also had an appendectomy at  that time.  He has no other past medical history noted.   REVIEW OF SYSTEMS:  He denies any headaches or fevers or chills.  There is  no productive cough or hemoptysis.  There is no dysphagia, odynophagia,  melena, or hematochezia.  There is no dysuria or hematuria.  There is no  rash or seizure activity.  There is a question of mild orthopnea and PND but  there is no pedal edema.  There is no claudication.  The remaining systems  are negative.   PHYSICAL EXAMINATION TODAY:  VITAL SIGNS:  Shows a blood pressure of 105/70  and his pulse is 95.  He weighs 190 pounds.  GENERAL:  He is well-developed and well-nourished in no acute distress.  SKIN:  Warm and dry.  HEENT:  Unremarkable with normal eyelids.  NECK:  Supple with a normal upstroke bilaterally and there are no bruits  noted.  There is no jugular venous distention and I cannot appreciate  thyromegaly.  CHEST:  Clear to auscultation with normal expansion.  CARDIOVASCULAR:  Reveals a regular rate and rhythm with normal S1 and S2.  There are no murmurs,  rubs, or gallops noted.  ABDOMEN:  Not tender or distended with positive bowel sounds.  No  hepatosplenomegaly.  No masses appreciated.  There is no abdominal bruit.  He has 2+ femoral pulses bilaterally and no bruits.  EXTREMITIES:  Show no edema and I can palpate no cords.  He has 2+ posterior  tibial pulses bilaterally.  NEUROLOGIC:  Grossly intact.   His electrocardiogram today shows a normal sinus rhythm with occasional  PACs.  The axis is normal.  There is inferior T wave inversion.   DIAGNOSES:  1.  Unstable angina.  2.  History of coronary artery disease.  3.  Hypertension.  4.  Hyperlipidemia.   PLAN:  Martin James presents with chest pain that is concerning for angina.  We  will admit and rule out myocardial infarction with serial enzymes.  We will  treat with his present medications including aspirin and Toprol-XL and I  will add intravenous heparin.  It should be noted that his most recent LDL  was greater than 100 and I will increase his Crestor to 20 mg p.o. daily.  He will need cholesterol and liver functions checked in 6 weeks.  The risks  and benefits of cardiac catheterization have been discussed with Martin James  and he agrees to proceed.  We will make further recommendations once we have  his anatomy.       BC/MEDQ  D:  05/16/2004  T:  05/16/2004  Job:  [id number]

## 2010-12-21 NOTE — Cardiovascular Report (Signed)
NAMEKOREE, SCHOPF                  ACCOUNT NO.:  [id number]   MEDICAL RECORD NO.:  [id number]          PATIENT TYPE:  INP   LOCATION:  6533                         FACILITY:  [facility]   PHYSICIAN:  Carole Binning, M.D. LHCDATE OF BIRTH:  July 17, 1941   DATE OF PROCEDURE:  05/16/2004  DATE OF DISCHARGE:                              CARDIAC CATHETERIZATION   PROCEDURE:  Left heart catheterization with coronary angiography and left  ventriculography.   INDICATIONS FOR PROCEDURE:  The patient is a 70 year old male with a history  of coronary artery disease.  He had multiple overlapping stents placed in  the distal right coronary artery in 1998.  He presented to the office today  with a two-week history of progressive exertional chest pain.  He was  admitted and referred for cardiac catheterization.   DESCRIPTION OF PROCEDURE:  A 6 French sheath was placed in the right femoral  artery.  Coronary angiography was performed using standard Judkins 6 French  catheters.  Left ventriculography was performed with an angled pigtail  catheter.  Contrast was Omnipaque.  There were no complications.   RESULTS:  HEMODYNAMICS:  Left ventricular pressure 118/10, aortic pressure  106/85.  There is no aortic valve gradient on catheter pullback.   LEFT VENTRICULOGRAM:  There is moderate hypokinesis of the inferior wall.  Ejection fraction is calculated at 56%.  There is no mitral regurgitation.   CORONARY ARTERIOGRAPHY:  (Right dominant)  1.  Left main is normal.  2.  Left anterior descending artery has a 30% stenosis in the proximal      vessel and diffuse 30% stenosis in the midvessel.  The LAD gives rise to      a large first diagonal branch which also has a 30% stenosis proximally.  3.  Left circumflex has a 20% stenosis in the midvessel.  The circumflex      gives rise to a normal size first obtuse marginal, small second obtuse      marginal, and large third obtuse marginal.  Distal to the third  obtuse      marginal, the circumflex is very small and it appears to be chronically      occluded with left-to-left collaterals filling a smaller fourth obtuse      marginal branch.  4.  Right coronary artery has a 40% stenosis in the proximal vessel.  In the      distal vessel just beyond the acute margin, there are overlapping      stents.  The vessel is 100% occluded within the proximal to midportion      of the stents.  The stents do extend into the AV groove where the right      coronary artery crossed the origin of the posterior descending artery      and posterolateral branch.  Within this area the stent is still open,      however, there is a long 70% stenosis within the distal portion of the      stented segment of the right coronary artery.  The distal right coronary  artery which includes a normal size posterior descending artery, two      small posterolateral branches followed by a normal size third      posterolateral branch filled via grade III right-to-right collaterals      arising from an acute marginal branch.  In the ostium of the posterior      descending artery, there appears to be a 50% stenosis.   IMPRESSION:  1.  Left ventricular systolic function in the low range of normal.  2.  Two vessel coronary artery disease characterized by chronic total      occlusion of what appears to be a very small distal circumflex with left-      to-left collaterals.  There is also an occlusion of the distal right      coronary artery within the previously placed stents which also appears      to be chronic in nature.  There are grade III collaterals filling the      distal right coronary artery.   PLAN:  These findings were reviewed with Dr. Riley Kill.  In light of the  patient's recent and progressive symptoms, it may be worth attempts at  reopening the occlusion of the distal right coronary artery.  In order to  better make that decision, we will attempt to obtain the patient's  old  catheterization films and report.  Based on this and further discussions  with the patient, we may opt to proceed with attempts at recanalization of  the chronic total occlusion in the distal right coronary artery.       MWP/MEDQ  D:  05/16/2004  T:  05/17/2004  Job:  [id number]   cc:   Stacie Glaze, M.D. [facility]   Olga Millers, M.D. [facility]

## 2010-12-21 NOTE — Discharge Summary (Signed)
Martin James, Martin James                  ACCOUNT NO.:  [id number]   MEDICAL RECORD NO.:  [id number]          PATIENT TYPE:  INP   LOCATION:  6533                         FACILITY:  [facility]   PHYSICIAN:  Arturo Morton. Riley Kill, M.D. [facility] OF BIRTH:  May 24, 1941   DATE OF ADMISSION:  05/16/2004  DATE OF DISCHARGE:  05/19/2004                                 DISCHARGE SUMMARY   BRIEF HISTORY:  This is a 70 year old male with a history of coronary artery  disease, hypertension, hyperlipidemia, and questionable history of fatty  liver who presented for evaluation of chest pain.  The patient was seen by  Dr. Jens Som and admitted for further evaluation.   PAST MEDICAL HISTORY:  1.  The patient does have a history of coronary artery disease.  He is      status post multiple stents.  He was previously followed by Dr. Tresa Endo      but has not seen him since 2001.  2.  Hypertension.  3.  Hyperlipidemia.  4.  Question of fatty liver evaluated by Dr. Lovell Sheehan in the past.   PAST SURGICAL HISTORY:  1.  Surgery for undescended testicle.  2.  Status post appendectomy.   ALLERGIES:  No known drug allergies.   SOCIAL HISTORY:  The patient has a remote history of tobacco use but has not  used any in 12 years.  He does not use alcohol.   FAMILY HISTORY:  Positive for coronary artery disease.   HOSPITAL COURSE:  As noted, this patient was admitted to [facility]  for further evaluation of chest pain with a previous history of coronary  artery disease.  He underwent cardiac catheterization on May 16, 2004,  performed by Dr. Gerri Spore.  He was found to have two-vessel coronary  disease with chronic total occlusion of what appeared to be a very small  distal circumflex with left collaterals.  There was also occlusion of the  right coronary artery within the previously placed stents which also  appeared to be chronic in nature.  There were grade III collaterals filling  the distal right coronary  artery.  The situation was discussed between Dr.  Gerri Spore and Dr. Riley Kill.  It was felt that, due to the nature of the  patient's ongoing symptoms, an attempt should be made at opening the  occlusion of the distal right coronary artery.   On May 18, 2004, the patient underwent PTCA of the RCA performed by Dr.  Juanda Chance, reducing the 100% stenosis to less than 10%.  Please see his  dictated report for full details.  It should be noted that the patient's  ejection fraction was 56% at time of his initial catheterization.   During the patient's stay, the did develop supraventricular tachycardia on  May 18, 2004.  This resolved on its own.  The patient apparently has had  previous episodes similar; however, they usually did not last as long.  His  Toprol was increased in the event of further supraventricular tachycardia.  Imdur was decreased.  Benicar was discontinue, and Crestor was increased  during this  admission as well.  Arrangements were made to discharge the  patient on May 19, 2004, in stable and improved condition.   LABORATORY AND X-RAY DATA:  Chemistry profile on the day of discharge  revealed BUN 10, creatinine 1.0, potassium 3.9, glucose 104.  Hemoglobin  13.9, hematocrit 38.9, WBC 8800, platelets 233,000.  Cardiac enzymes on  October 15 revealed CK 199, MB 3.1, index 1.6.  Other cardiac enzymes this  admission were negative.  A lipid profile on October 13 revealed cholesterol  114, triglycerides 96, HDL 27, LDL 68.  TSH 1.5.  Initial hemoglobin was  15.4, hematocrit 43.2.   Chest x-ray showed no active cardiopulmonary disease.   DISCHARGE MEDICATIONS:  1.  Plavix 75 mg daily.  2.  Coated aspirin 81 mg daily.  3.  Crestor 20 mg at bedtime.  4.  Norvasc 10 mg daily.  5.  Toprol XL 50 mg 1-1/2 tablets daily.  6.  Benicar was discontinued for now.  7.  Imdur 30 mg daily.  8.  Niacin 500 mg at bedtime.  9.  Nitroglycerin p.r.n. for chest pain.  10. Tylenol as  needed for other pain.   The patient was told to avoid any strenuous activity or driving for at least  two days.  He was told not to lift more than 10 pounds for one week.   He was to be on a low-salt, low-fat diet.   He was told to call the office if he had any increased pain, swelling, or  bleeding from his groin.   He was to have a lipid and liver profile within six weeks.   He was to follow up with Dr. Jens Som.  The office would call him for an  appointment.  He was to follow up with Dr. Lovell Sheehan as needed or as  scheduled.   PROBLEM LIST AT TIME OF DISCHARGE:  1.  Percutaneous intervention of the right coronary artery performed May 18, 2004.  2.  Previous cardiac interventions including multiple stents, previously      followed by Nicki Guadalajara.  3.  Hyperlipidemia.  4.  Supraventricular tachycardia  this admission with spontaneous      resolution.  The patient may need to be evaluated by the EP physicians.  5.  History of hypertension.  6.  Status post multiple surgeries.  7.  Ejection fraction 56% at time of catheterization.  Please see      catheterization report from May 16, 2004 for full details.       DR/MEDQ  D:  05/19/2004  T:  05/19/2004  Job:  [id number]   cc:   Stacie Glaze, M.D. [facility]

## 2010-12-21 NOTE — Cardiovascular Report (Signed)
NAMEREILLEY, LATORRE                  ACCOUNT NO.:  [id number]   MEDICAL RECORD NO.:  [id number]          PATIENT TYPE:  INP   LOCATION:  6533                         FACILITY:  [facility]   PHYSICIAN:  Charlies Constable, M.D. [facility] DATE OF BIRTH:  09/14/1940   DATE OF PROCEDURE:  05/18/2004  DATE OF DISCHARGE:                              CARDIAC CATHETERIZATION   PROCEDURE:  Percutaneous coronary intervention.   CLINICAL HISTORY:  Mr. Panuco is 70 years old and has documented coronary  disease.  He had overlapping Palmaz-Schatz stents which were 15 x 3.0 mm in  the distal right coronary artery placed in 1998.  He developed recurrent  chest pain about two weeks ago and was admitted to the hospital recently and  studied by Dr. Gerri Spore yesterday and found to have a total occlusion of  the distal right coronary artery within the stents.  It was a moderately  long occlusion, and it was felt to be a chronic total occlusion.  He was  scheduled for intervention today.   PROCEDURE:  The procedure was performed via the right femoral artery using  an arterial sheath and an AL-1 6-French guiding catheter with sideholes.  The patient was given weight-adjusted heparin following ACT greater than 300  seconds, and had been given Plavix early this morning.  We initially crossed  the lesion with a PT-2 light support wire.  We crossed surprisingly easily.  We then opened the vessel with a 2.0 x 20 mm  Maverick, performing multiple  inflations up to 8 atmospheres within the total occlusion.  We then went in  with a 2.5 x 15 mm cutting balloon and performed multiple inflations up to 8  atmospheres throughout the length of the lesion.  We then stented the lesion  beginning distally, crossing a very small posterior descending branch and  ending just before a larger posterolateral branch.  We used a 2.5 x 24 mm  Taxus and deployed this with one inflation of 8 atmospheres for 30 seconds,  and a second inflation of 14  atmospheres for 30 seconds with the balloon  inside the distal edge.  We then passed a second Taxus stent which was 2.75  x 20 mm overlapping the first stent.  We deployed this with one inflation of  14 atmospheres for 30 seconds.  We then post dilated both stents with a 2.75  x 20 mm Quantum Maverick balloon performing multiple inflations up to 18  atmospheres, avoiding the distal edge.  We then used a 2.0 balloon, and  performed dilatations distal to the last stent and into the second posterior  descending branch.  We performed two inflations in each area up to 8  atmospheres for 30 seconds.  Repeat diagnostics were then performed through  a guiding catheter.  The patient tolerated the procedure well and left the  laboratory in satisfactory condition.   RESULTS:  Initially, the right coronary artery was totally occluded in its  mid to distal portion.  The distal vessel consisting of a small and larger  posterior descending branch and two posterolateral branches filled  by  collaterals from a right ventricular branch off the right coronary artery.  Following stenting, the stenosis improved from 100% to less than 10%, and  the flow improved from TIMI 0 to TIMI 3 flow.   CONCLUSION:  Successful stenting of the chronic total occlusion of the mid  to distal right coronary artery with two overlapping Taxus stents with  improvement in the percentage of narrowing from 100% narrowing to less than  10%, and improvement in the flow from TIMI 0 to TIMI 3 flow.   DISPOSITION:  The patient was returned to room for further observation.       BB/MEDQ  D:  05/18/2004  T:  05/18/2004  Job:  [id number]   cc:   Stacie Glaze, M.D. [facility]   Olga Millers, M.D. [facility]   Carole Binning, M.D. [facility]   Cardiopulmonary Laboratory

## 2011-02-20 ENCOUNTER — Telehealth: Payer: Self-pay | Admitting: Internal Medicine

## 2011-02-27 ENCOUNTER — Other Ambulatory Visit: Payer: Self-pay | Admitting: *Deleted

## 2011-02-27 MED ORDER — NADOLOL 40 MG PO TABS: 40.0000 mg | ORAL_TABLET | Freq: Every day | ORAL | Status: DC

## 2011-02-27 MED ORDER — AMLODIPINE BESYLATE 5 MG PO TABS: 5.0000 mg | ORAL_TABLET | Freq: Every day | ORAL | Status: DC

## 2011-02-27 NOTE — Telephone Encounter (Signed)
Refill Amlodipine and Nadalol to [facility].

## 2011-06-03 ENCOUNTER — Encounter: Payer: Self-pay | Admitting: Internal Medicine

## 2011-06-03 ENCOUNTER — Ambulatory Visit (INDEPENDENT_AMBULATORY_CARE_PROVIDER_SITE_OTHER): Payer: Medicare Other | Admitting: Internal Medicine

## 2011-06-03 VITALS — BP 120/70 | HR 60 | Temp 98.2°F | Resp 16 | Ht 69.0 in | Wt 160.0 lb

## 2011-06-03 DIAGNOSIS — I2510 Atherosclerotic heart disease of native coronary artery without angina pectoris: Secondary | ICD-10-CM

## 2011-06-03 DIAGNOSIS — R972 Elevated prostate specific antigen [PSA]: Secondary | ICD-10-CM

## 2011-06-03 DIAGNOSIS — R7402 Elevation of levels of lactic acid dehydrogenase (LDH): Secondary | ICD-10-CM

## 2011-06-03 DIAGNOSIS — E785 Hyperlipidemia, unspecified: Secondary | ICD-10-CM

## 2011-06-03 DIAGNOSIS — I10 Essential (primary) hypertension: Secondary | ICD-10-CM

## 2011-06-03 LAB — BASIC METABOLIC PANEL
BUN: 16 mg/dL (ref 6–23)
CO2: 27 mEq/L (ref 19–32)
Calcium: 9.6 mg/dL (ref 8.4–10.5)
Chloride: 103 mEq/L (ref 96–112)
Creatinine, Ser: 1.2 mg/dL (ref 0.4–1.5)
GFR: 64.58 mL/min (ref >60.00)
Glucose, Bld: 97 mg/dL (ref 70–99)
Potassium: 5.6 mEq/L — ABNORMAL HIGH (ref 3.5–5.1)
Sodium: 138 mEq/L (ref 135–145)

## 2011-06-03 LAB — LIPID PANEL
Cholesterol: 152 mg/dL (ref 0–200)
HDL: 41.8 mg/dL (ref >39.00)
LDL Cholesterol: 95 mg/dL (ref 0–99)
Total CHOL/HDL Ratio: 4
Triglycerides: 77 mg/dL (ref 0.0–149.0)
VLDL: 15.4 mg/dL (ref 0.0–40.0)

## 2011-06-03 LAB — HEPATIC FUNCTION PANEL
ALT: 25 U/L (ref 0–53)
AST: 28 U/L (ref 0–37)
Albumin: 4.2 g/dL (ref 3.5–5.2)
Alkaline Phosphatase: 50 U/L (ref 39–117)
Bilirubin, Direct: 0.1 mg/dL (ref 0.0–0.3)
Total Bilirubin: 0.5 mg/dL (ref 0.3–1.2)
Total Protein: 7.1 g/dL (ref 6.0–8.3)

## 2011-06-03 LAB — PSA: PSA: 0.58 ng/mL (ref 0.10–4.00)

## 2011-06-03 MED ORDER — ISOSORBIDE MONONITRATE ER 60 MG PO TB24: 60.0000 mg | ORAL_TABLET | Freq: Every day | ORAL | Status: DC

## 2011-06-03 NOTE — Progress Notes (Signed)
  Subjective:    Patient ID: Martin James, male    DOB: May 16, 1941, 70 y.o.   MRN: [id number]  HPI Follow up for lipids, elevated liver function hx and PSA monitoring Weight stable Feels well, occasional increased frequency No chest pain or SOB Blood pressure stable    Review of Systems  Constitutional: Negative for fever and fatigue.  HENT: Negative for hearing loss, congestion, neck pain and postnasal drip.   Eyes: Negative for discharge, redness and visual disturbance.  Respiratory: Negative for cough, shortness of breath and wheezing.   Cardiovascular: Negative for leg swelling.  Gastrointestinal: Negative for abdominal pain, constipation and abdominal distention.  Genitourinary: Negative for urgency and frequency.  Musculoskeletal: Negative for joint swelling and arthralgias.  Skin: Negative for color change and rash.  Neurological: Negative for weakness and light-headedness.  Hematological: Negative for adenopathy.  Psychiatric/Behavioral: Negative for behavioral problems.   Past Medical History  Diagnosis Date  . CAD (coronary artery disease)   . Hyperlipidemia   . Hypertension   . Depression   . Insomnia    Past Surgical History  Procedure Date  . Appendectomy   . Undescended testicle surgery     reports that he quit smoking about 20 years ago. He does not have any smokeless tobacco history on file. He reports that he drinks about 1.2 ounces of alcohol per week. His drug history not on file. family history includes Diabetes in his father and Stroke in his father. No Known Allergies      Objective:   Physical Exam  Nursing note and vitals reviewed. Constitutional: He appears well-developed and well-nourished.  HENT:  Head: Normocephalic and atraumatic.  Eyes: Conjunctivae are normal. Pupils are equal, round, and reactive to light.  Neck: Normal range of motion. Neck supple.  Cardiovascular: Normal rate and regular rhythm.   Pulmonary/Chest: Effort normal  and breath sounds normal.  Abdominal: Soft. Bowel sounds are normal.          Assessment & Plan:  The patient is a 70 year old male with hyperlipidemia history of hypertension history of coronary artery disease who has stabilized his weight loss blood pressure is excellent he presents today for monitoring of his cholesterol his liver functions and for monitoring of his PSA.  His chief complaint today is increased urinary frequency this may be related to the prostate He requires a refill on his isosorbide Blood pressure is stable CAD is stable Monitoring of lipid today with blood work

## 2011-06-03 NOTE — Patient Instructions (Signed)
Patient was instructed to continue all medications as prescribed. To stop at the checkout desk and schedule a followup appointment  

## 2011-08-29 ENCOUNTER — Telehealth: Payer: Self-pay | Admitting: Internal Medicine

## 2011-08-29 MED ORDER — ZOLPIDEM TARTRATE 10 MG PO TABS: 10.0000 mg | ORAL_TABLET | Freq: Every evening | ORAL | Status: DC | PRN

## 2011-08-29 NOTE — Telephone Encounter (Signed)
done

## 2011-08-29 NOTE — Telephone Encounter (Signed)
Pt called req refill of zolpidem (AMBIEN) 10 MG tablet to [facility]

## 2011-11-20 ENCOUNTER — Other Ambulatory Visit: Payer: Self-pay | Admitting: *Deleted

## 2011-11-20 DIAGNOSIS — I10 Essential (primary) hypertension: Secondary | ICD-10-CM

## 2011-11-20 DIAGNOSIS — I2510 Atherosclerotic heart disease of native coronary artery without angina pectoris: Secondary | ICD-10-CM

## 2011-11-20 MED ORDER — NADOLOL 40 MG PO TABS: 40.0000 mg | ORAL_TABLET | Freq: Every day | ORAL | Status: DC

## 2011-11-20 MED ORDER — AMLODIPINE BESYLATE 5 MG PO TABS: 5.0000 mg | ORAL_TABLET | Freq: Every day | ORAL | Status: DC

## 2011-11-20 MED ORDER — ROSUVASTATIN CALCIUM 10 MG PO TABS: 10.0000 mg | ORAL_TABLET | Freq: Every day | ORAL | Status: DC

## 2011-11-20 MED ORDER — ISOSORBIDE MONONITRATE ER 60 MG PO TB24: 60.0000 mg | ORAL_TABLET | Freq: Every day | ORAL | Status: DC

## 2011-11-20 MED ORDER — ZOLPIDEM TARTRATE 10 MG PO TABS: 10.0000 mg | ORAL_TABLET | Freq: Every evening | ORAL | Status: DC | PRN

## 2011-11-26 ENCOUNTER — Telehealth: Payer: Self-pay | Admitting: Family Medicine

## 2011-11-26 NOTE — Telephone Encounter (Signed)
Called pharmacy and 30 of zolpidem is $17.00-Left message on machine For pt and gave pharmacy refill on script if he wants to pay out of pocket

## 2011-11-26 NOTE — Telephone Encounter (Signed)
Patient's zolpidem prior Martin James has been denied by [facility]. Per [employer], the limit on this med is 90 pills per 365 days. Anything over that must be paid by pt out of pocket. I have informed the pharmacy.

## 2011-12-02 ENCOUNTER — Encounter: Payer: Self-pay | Admitting: Internal Medicine

## 2011-12-02 ENCOUNTER — Ambulatory Visit (INDEPENDENT_AMBULATORY_CARE_PROVIDER_SITE_OTHER): Payer: Medicare Other | Admitting: Internal Medicine

## 2011-12-02 VITALS — BP 122/78 | HR 72 | Temp 98.3°F | Resp 16 | Ht 69.0 in | Wt 162.0 lb

## 2011-12-02 DIAGNOSIS — E785 Hyperlipidemia, unspecified: Secondary | ICD-10-CM | POA: Diagnosis not present

## 2011-12-02 DIAGNOSIS — I10 Essential (primary) hypertension: Secondary | ICD-10-CM

## 2011-12-02 DIAGNOSIS — K7689 Other specified diseases of liver: Secondary | ICD-10-CM

## 2011-12-02 DIAGNOSIS — I2510 Atherosclerotic heart disease of native coronary artery without angina pectoris: Secondary | ICD-10-CM | POA: Diagnosis not present

## 2011-12-02 LAB — BASIC METABOLIC PANEL
BUN: 12 mg/dL (ref 6–23)
CO2: 29 mEq/L (ref 19–32)
Calcium: 9.4 mg/dL (ref 8.4–10.5)
Chloride: 103 mEq/L (ref 96–112)
Creatinine, Ser: 0.9 mg/dL (ref 0.4–1.5)
GFR: 87.04 mL/min (ref >60.00)
Glucose, Bld: 106 mg/dL — ABNORMAL HIGH (ref 70–99)
Potassium: 5 mEq/L (ref 3.5–5.1)
Sodium: 138 mEq/L (ref 135–145)

## 2011-12-02 NOTE — Progress Notes (Signed)
Subjective:    Patient ID: Martin James, male    DOB: 15-Jun-1941, 71 y.o.   MRN: [id number]  HPI This 71 year old male who presents for followup of hypertension history of coronary artery disease history of hyperlipidemia on Niaspan and Crestor and a history of elevated liver functions.  He had screening blood work done 6 months ago which showed normal liver functions an excellent cholesterol panel and mild elevation of potassium which will be rechecked today.  Generally he is doing well tolerating all his medications with excellent blood pressure control and stable weight   Review of Systems  Constitutional: Negative for fever and fatigue.  HENT: Negative for hearing loss, congestion, neck pain and postnasal drip.   Eyes: Negative for discharge, redness and visual disturbance.  Respiratory: Negative for cough, shortness of breath and wheezing.   Cardiovascular: Negative for leg swelling.  Gastrointestinal: Negative for abdominal pain, constipation and abdominal distention.  Genitourinary: Negative for urgency and frequency.  Musculoskeletal: Negative for joint swelling and arthralgias.  Skin: Negative for color change and rash.  Neurological: Negative for weakness and light-headedness.  Hematological: Negative for adenopathy.  Psychiatric/Behavioral: Negative for behavioral problems.     Past Medical History  Diagnosis Date  . CAD (coronary artery disease)   . Hyperlipidemia   . Hypertension   . Depression   . Insomnia     History   Social History  . Marital Status: Married    Spouse Name: N/A    Number of Children: N/A  . Years of Education: N/A   Occupational History  . retired    Social History Main Topics  . Smoking status: Former Smoker    Quit date: 08/05/1990  . Smokeless tobacco: Not on file  . Alcohol Use: 1.2 oz/week    1 Glasses of wine, 1 Cans of beer per week  . Drug Use: Not on file  . Sexually Active: Yes   Other Topics Concern  . Not on file     Social History Narrative  . No narrative on file    Past Surgical History  Procedure Date  . Appendectomy   . Undescended testicle surgery     Family History  Problem Relation Age of Onset  . Diabetes Father   . Stroke Father     No Known Allergies  Current Outpatient Prescriptions on File Prior to Visit  Medication Sig Dispense Refill  . amLODipine (NORVASC) 5 MG tablet Take 1 tablet (5 mg total) by mouth daily.  90 tablet  3  . aspirin 81 MG tablet Take 81 mg by mouth daily.        . isosorbide mononitrate (IMDUR) 60 MG 24 hr tablet Take 1 tablet (60 mg total) by mouth daily.  90 tablet  3  . nadolol (CORGARD) 40 MG tablet Take 1 tablet (40 mg total) by mouth daily.  90 tablet  3  . niacin (NIASPAN) 500 MG CR tablet Take 1,000 mg by mouth at bedtime.        . rosuvastatin (CRESTOR) 10 MG tablet Take 1 tablet (10 mg total) by mouth daily.  90 tablet  3  . zolpidem (AMBIEN) 10 MG tablet Take 1 tablet (10 mg total) by mouth at bedtime as needed for sleep.  90 tablet  1    BP 122/78  Pulse 72  Temp 98.3 F (36.8 C)  Resp 16  Ht 5\' 9"  (1.753 m)  Wt 162 lb (73.483 kg)  BMI 23.92 kg/m2  Objective:   Physical Exam  Nursing note and vitals reviewed. Constitutional: He appears well-developed and well-nourished.  HENT:  Head: Normocephalic and atraumatic.  Eyes: Conjunctivae are normal. Pupils are equal, round, and reactive to light.  Neck: Normal range of motion. Neck supple.  Cardiovascular: Normal rate and regular rhythm.   Pulmonary/Chest: Effort normal and breath sounds normal.  Abdominal: Soft. Bowel sounds are normal.          Assessment & Plan:  Reviewed of blood work and recheck of potassium Frequent urination Has one banana a week and does not use OJ He has an occasional beer.  Recheck bmet today for elevated potassium

## 2011-12-02 NOTE — Patient Instructions (Addendum)
The patient is instructed to continue all medications as prescribed. Schedule followup with check out clerk upon leaving the clinic  Drink one to 2 glasses of water a day in addition to other fluid

## 2012-06-10 DIAGNOSIS — Z23 Encounter for immunization: Secondary | ICD-10-CM | POA: Diagnosis not present

## 2012-06-11 ENCOUNTER — Encounter: Payer: Self-pay | Admitting: Internal Medicine

## 2012-06-11 ENCOUNTER — Ambulatory Visit (INDEPENDENT_AMBULATORY_CARE_PROVIDER_SITE_OTHER): Payer: Medicare Other | Admitting: Internal Medicine

## 2012-06-11 VITALS — BP 110/70 | HR 60 | Temp 98.0°F | Resp 16 | Ht 69.0 in | Wt 162.0 lb

## 2012-06-11 DIAGNOSIS — I2510 Atherosclerotic heart disease of native coronary artery without angina pectoris: Secondary | ICD-10-CM

## 2012-06-11 DIAGNOSIS — T887XXA Unspecified adverse effect of drug or medicament, initial encounter: Secondary | ICD-10-CM | POA: Diagnosis not present

## 2012-06-11 DIAGNOSIS — E785 Hyperlipidemia, unspecified: Secondary | ICD-10-CM | POA: Diagnosis not present

## 2012-06-11 DIAGNOSIS — K7689 Other specified diseases of liver: Secondary | ICD-10-CM

## 2012-06-11 DIAGNOSIS — I10 Essential (primary) hypertension: Secondary | ICD-10-CM

## 2012-06-11 DIAGNOSIS — Z23 Encounter for immunization: Secondary | ICD-10-CM | POA: Diagnosis not present

## 2012-06-11 DIAGNOSIS — Z0000 Encounter for general adult medical examination without abnormal findings: Secondary | ICD-10-CM

## 2012-06-11 LAB — POCT URINALYSIS DIPSTICK
Leukocytes, UA: NEGATIVE
Nitrite, UA: NEGATIVE
Protein, UA: NEGATIVE
pH, UA: 5.5

## 2012-06-11 LAB — HEPATIC FUNCTION PANEL
ALT: 26 U/L (ref 0–53)
AST: 34 U/L (ref 0–37)
Albumin: 3.9 g/dL (ref 3.5–5.2)
Alkaline Phosphatase: 46 U/L (ref 39–117)
Bilirubin, Direct: 0.1 mg/dL (ref 0.0–0.3)
Total Bilirubin: 1 mg/dL (ref 0.3–1.2)
Total Protein: 7.3 g/dL (ref 6.0–8.3)

## 2012-06-11 LAB — CBC WITH DIFFERENTIAL/PLATELET
Basophils Absolute: 0.1 10*3/uL (ref 0.0–0.1)
Lymphocytes Relative: 26.1 % (ref 12.0–46.0)
Monocytes Relative: 12.5 % — ABNORMAL HIGH (ref 3.0–12.0)
Neutrophils Relative %: 56.5 % (ref 43.0–77.0)
Platelets: 224 10*3/uL (ref 150.0–400.0)
RDW: 13.6 % (ref 11.5–14.6)
WBC: 5.9 10*3/uL (ref 4.5–10.5)

## 2012-06-11 LAB — BASIC METABOLIC PANEL
BUN: 15 mg/dL (ref 6–23)
CO2: 24 mEq/L (ref 19–32)
Calcium: 8.8 mg/dL (ref 8.4–10.5)
Chloride: 102 mEq/L (ref 96–112)
Creatinine, Ser: 1 mg/dL (ref 0.4–1.5)
GFR: 74.5 mL/min (ref >60.00)
Glucose, Bld: 91 mg/dL (ref 70–99)
Potassium: 4.7 mEq/L (ref 3.5–5.1)
Sodium: 134 mEq/L — ABNORMAL LOW (ref 135–145)

## 2012-06-11 LAB — LIPID PANEL
Cholesterol: 150 mg/dL (ref 0–200)
HDL: 36.9 mg/dL — ABNORMAL LOW (ref >39.00)
LDL Cholesterol: 98 mg/dL (ref 0–99)
Total CHOL/HDL Ratio: 4
Triglycerides: 76 mg/dL (ref 0.0–149.0)
VLDL: 15.2 mg/dL (ref 0.0–40.0)

## 2012-06-11 LAB — TSH: TSH: 0.5 u[IU]/mL (ref 0.35–5.50)

## 2012-06-11 NOTE — Addendum Note (Signed)
Addended by: Willy Eddy on: 06/11/2012 12:59 PM   Modules accepted: Orders

## 2012-06-11 NOTE — Progress Notes (Signed)
Subjective:    Patient ID: Martin James, male    DOB: January 17, 1941, 71 y.o.   MRN: [id number]  HPI  The patient has a hx of fatty liver with significant weight loss resulting in improvement Mood has been persistently an issue.  He has mild to moderate insomnia that he takes an Ambien generic for periodically.  His blood pressure is excellent been well controlled.  His lipids will be monitored today by a lipid as well as a liver renal function will be monitored by a basic metabolic panel We will also monitor potassium Patient will also have a questionnaire completed for Medicare wellness  Review of Systems  Constitutional: Negative for fever and fatigue.  HENT: Negative for hearing loss, congestion, neck pain and postnasal drip.   Eyes: Negative for discharge, redness and visual disturbance.  Respiratory: Negative for cough, shortness of breath and wheezing.   Cardiovascular: Negative for leg swelling.  Gastrointestinal: Negative for abdominal pain, constipation and abdominal distention.  Genitourinary: Negative for urgency and frequency.  Musculoskeletal: Negative for joint swelling and arthralgias.  Skin: Negative for color change and rash.  Neurological: Negative for weakness and light-headedness.  Hematological: Negative for adenopathy.  Psychiatric/Behavioral: Negative for behavioral problems.   Past Medical History  Diagnosis Date  . CAD (coronary artery disease)   . Hyperlipidemia   . Hypertension   . Depression   . Insomnia     History   Social History  . Marital Status: Married    Spouse Name: N/A    Number of Children: N/A  . Years of Education: N/A   Occupational History  . retired    Social History Main Topics  . Smoking status: Former Smoker    Quit date: 08/05/1990  . Smokeless tobacco: Not on file  . Alcohol Use: 1.2 oz/week    1 Glasses of wine, 1 Cans of beer per week  . Drug Use: Not on file  . Sexually Active: Yes   Other Topics Concern  .  Not on file   Social History Narrative  . No narrative on file    Past Surgical History  Procedure Date  . Appendectomy   . Undescended testicle surgery     Family History  Problem Relation Age of Onset  . Diabetes Father   . Stroke Father     No Known Allergies  Current Outpatient Prescriptions on File Prior to Visit  Medication Sig Dispense Refill  . amLODipine (NORVASC) 5 MG tablet Take 1 tablet (5 mg total) by mouth daily.  90 tablet  3  . aspirin 81 MG tablet Take 81 mg by mouth daily.        . isosorbide mononitrate (IMDUR) 60 MG 24 hr tablet Take 1 tablet (60 mg total) by mouth daily.  90 tablet  3  . nadolol (CORGARD) 40 MG tablet Take 1 tablet (40 mg total) by mouth daily.  90 tablet  3  . niacin (NIASPAN) 500 MG CR tablet Take 1,000 mg by mouth at bedtime.        . rosuvastatin (CRESTOR) 10 MG tablet Take 1 tablet (10 mg total) by mouth daily.  90 tablet  3  . zolpidem (AMBIEN) 10 MG tablet Take 1 tablet (10 mg total) by mouth at bedtime as needed for sleep.  90 tablet  1    BP 110/70  Pulse 60  Temp 98 F (36.7 C)  Resp 16  Ht 5\' 9"  (1.753 m)  Wt 162 lb (73.483 kg)  BMI 23.92 kg/m2       Objective:   Physical Exam  Nursing note and vitals reviewed. Constitutional: He appears well-developed and well-nourished.  HENT:  Head: Normocephalic and atraumatic.  Eyes: Conjunctivae normal are normal. Pupils are equal, round, and reactive to light.  Neck: Normal range of motion. Neck supple.  Cardiovascular: Normal rate and regular rhythm.   Murmur heard. Pulmonary/Chest: Effort normal and breath sounds normal.  Abdominal: Soft. Bowel sounds are normal.          Assessment & Plan:  Patient's blood pressure stable on Norvasc.  He is also taking Corgard to control rate he is on Niaspan and Crestor for hyperlipidemia and we will monitor a lipid and liver today.  He is on Imdur were for known coronary disease and has not experienced any chest  pain.   Subjective:    Martin James is a 71 y.o. male who presents for Medicare Annual/Subsequent preventive examination.   Preventive Screening-Counseling & Management  Tobacco History  Smoking status  . Former Smoker  . Quit date: 08/05/1990  Smokeless tobacco  . Not on file    Problems Prior to Visit 1.   Current Problems (verified) Patient Active Problem List  Diagnosis  . HYPERLIPIDEMIA  . INSOMNIA, PERSISTENT  . DEPRESSION  . SYNDROME, CARPAL TUNNEL  . HYPERTENSION  . CORONARY ARTERY DISEASE  . FATTY LIVER DISEASE  . ELEVATION, TRANSAMINASE/LDH LEVELS    Medications Prior to Visit Current Outpatient Prescriptions on File Prior to Visit  Medication Sig Dispense Refill  . amLODipine (NORVASC) 5 MG tablet Take 1 tablet (5 mg total) by mouth daily.  90 tablet  3  . aspirin 81 MG tablet Take 81 mg by mouth daily.        . isosorbide mononitrate (IMDUR) 60 MG 24 hr tablet Take 1 tablet (60 mg total) by mouth daily.  90 tablet  3  . nadolol (CORGARD) 40 MG tablet Take 1 tablet (40 mg total) by mouth daily.  90 tablet  3  . niacin (NIASPAN) 500 MG CR tablet Take 1,000 mg by mouth at bedtime.        . rosuvastatin (CRESTOR) 10 MG tablet Take 1 tablet (10 mg total) by mouth daily.  90 tablet  3  . zolpidem (AMBIEN) 10 MG tablet Take 1 tablet (10 mg total) by mouth at bedtime as needed for sleep.  90 tablet  1    Current Medications (verified) Current Outpatient Prescriptions  Medication Sig Dispense Refill  . amLODipine (NORVASC) 5 MG tablet Take 1 tablet (5 mg total) by mouth daily.  90 tablet  3  . aspirin 81 MG tablet Take 81 mg by mouth daily.        . isosorbide mononitrate (IMDUR) 60 MG 24 hr tablet Take 1 tablet (60 mg total) by mouth daily.  90 tablet  3  . nadolol (CORGARD) 40 MG tablet Take 1 tablet (40 mg total) by mouth daily.  90 tablet  3  . niacin (NIASPAN) 500 MG CR tablet Take 1,000 mg by mouth at bedtime.        . rosuvastatin (CRESTOR) 10 MG tablet  Take 1 tablet (10 mg total) by mouth daily.  90 tablet  3  . zolpidem (AMBIEN) 10 MG tablet Take 1 tablet (10 mg total) by mouth at bedtime as needed for sleep.  90 tablet  1     Allergies (verified) Review of patient's allergies indicates no known allergies.  PAST HISTORY  Family History Family History  Problem Relation Age of Onset  . Diabetes Father   . Stroke Father     Social History History  Substance Use Topics  . Smoking status: Former Smoker    Quit date: 08/05/1990  . Smokeless tobacco: Not on file  . Alcohol Use: 1.2 oz/week    1 Glasses of wine, 1 Cans of beer per week    Are there smokers in your home (other than you)?  No  Risk Factors Current exercise habits: Gym/ health club routine includes cardio.  Dietary issues discussed: none    Cardiac risk factors: advanced age (older than 60 for men, 97 for women), hypertension, male gender, smoking/ tobacco exposure and known cad.  Depression Screen (Note: if answer to either of the following is "Yes", a more complete depression screening is indicated)   Q1: Over the past two weeks, have you felt down, depressed or hopeless? No  Q2: Over the past two weeks, have you felt little interest or pleasure in doing things? No  Have you lost interest or pleasure in daily life? No  Do you often feel hopeless? No  Do you cry easily over simple problems? No  Activities of Daily Living In your present state of health, do you have any difficulty performing the following activities?:  Driving? No Managing money?  No Feeding yourself? No Getting from bed to chair? No Climbing a flight of stairs? No Preparing food and eating?: No Bathing or showering? No Getting dressed: No Getting to the toilet? No Using the toilet:No Moving around from place to place: No In the past year have you fallen or had a near fall?:No   Are you sexually active?  Yes  Do you have more than one partner?  No  Hearing Difficulties: No Do  you often ask people to speak up or repeat themselves? No Do you experience ringing or noises in your ears? No Do you have difficulty understanding soft or whispered voices? No   Do you feel that you have a problem with memory? No  Do you often misplace items? No  Do you feel safe at home?  Yes  Cognitive Testing  Alert? Yes  Normal Appearance?Yes  Oriented to person? Yes  Place? Yes   Time? Yes  Recall of three objects?  Yes  Can perform simple calculations? Yes  Displays appropriate judgment?Yes  Can read the correct time from a watch face?Yes   Advanced Directives have been discussed with the patient? Yes   List the Names of Other Physician/Practitioners you currently use: 1.    Indicate any recent Medical Services you may have received from other than [facility] providers in the past year (date may be approximate).  Immunization History  Administered Date(s) Administered  . Influenza Whole 06/04/2007, 06/07/2009, 05/03/2010  . Pneumococcal Polysaccharide 06/04/2007    Screening Tests Health Maintenance  Topic Date Due  . Tetanus/tdap  11/29/1958  . Colonoscopy  11/28/1989  . Zostavax  11/29/1999  . Influenza Vaccine  04/05/2012  . Pneumococcal Polysaccharide Vaccine Age 68 And Over  Completed    All answers were reviewed with the patient and necessary referrals were made:  Carrie Mew, MD   06/11/2012   History reviewed: allergies, current medications, past family history, past medical history, past social history, past surgical history and problem list  Review of Systems A comprehensive review of systems was negative.    Objective:     Vision by Snellen chart: right eye:20/20,  left eye:20/20 corrected Blood pressure 110/70, pulse 60, temperature 98 F (36.7 C), resp. rate 16, height 5\' 9"  (1.753 m), weight 162 lb (73.483 kg). Body mass index is 23.92 kg/(m^2).  No exam performed today, exam part of focused visit.     Assessment:      Patient presents  for yearly preventative medicine examination.   all immunizations and health maintenance protocols were reviewed with the patient and they are up to date with these protocols.   screening laboratory values were reviewed with the patient including screening of hyperlipidemia PSA renal function and hepatic function.   There medications past medical history social history problem list and allergies were reviewed in detail.   Goals were established with regard to weight loss exercise diet in compliance with medications      Plan:     During the course of the visit the patient was educated and counseled about appropriate screening and preventive services including:    Influenza vaccine  Td vaccine  Advanced directives: DNI/DNR  Diet review for nutrition referral? Yes ____  Not Indicated ____   Patient Instructions (the written plan) was given to the patient.  Medicare Attestation I have personally reviewed: The patient's medical and social history Their use of alcohol, tobacco or illicit drugs Their current medications and supplements The patient's functional ability including ADLs,fall risks, home safety risks, cognitive, and hearing and visual impairment Diet and physical activities Evidence for depression or mood disorders  The patient's weight, height, BMI, and visual acuity have been recorded in the chart.  I have made referrals, counseling, and provided education to the patient based on review of the above and I have provided the patient with a written personalized care plan for preventive services.     Carrie Mew, MD   06/11/2012

## 2012-08-25 ENCOUNTER — Other Ambulatory Visit: Payer: Self-pay | Admitting: *Deleted

## 2012-08-25 MED ORDER — ZOLPIDEM TARTRATE 10 MG PO TABS: 10.0000 mg | ORAL_TABLET | Freq: Every evening | ORAL | Status: DC | PRN

## 2012-11-19 ENCOUNTER — Other Ambulatory Visit: Payer: Self-pay | Admitting: Internal Medicine

## 2012-12-03 ENCOUNTER — Other Ambulatory Visit: Payer: Self-pay | Admitting: Internal Medicine

## 2012-12-14 ENCOUNTER — Telehealth: Payer: Self-pay | Admitting: Internal Medicine

## 2012-12-14 ENCOUNTER — Encounter: Payer: Self-pay | Admitting: Internal Medicine

## 2012-12-14 ENCOUNTER — Ambulatory Visit (INDEPENDENT_AMBULATORY_CARE_PROVIDER_SITE_OTHER): Payer: Medicare Other | Admitting: Internal Medicine

## 2012-12-14 VITALS — BP 106/70 | HR 56 | Temp 98.2°F | Resp 16 | Ht 69.0 in | Wt 160.0 lb

## 2012-12-14 DIAGNOSIS — E785 Hyperlipidemia, unspecified: Secondary | ICD-10-CM | POA: Diagnosis not present

## 2012-12-14 DIAGNOSIS — I10 Essential (primary) hypertension: Secondary | ICD-10-CM | POA: Diagnosis not present

## 2012-12-14 DIAGNOSIS — I2510 Atherosclerotic heart disease of native coronary artery without angina pectoris: Secondary | ICD-10-CM | POA: Diagnosis not present

## 2012-12-14 DIAGNOSIS — K7689 Other specified diseases of liver: Secondary | ICD-10-CM

## 2012-12-14 LAB — HEPATIC FUNCTION PANEL
ALT: 20 U/L (ref 0–53)
Total Bilirubin: 0.7 mg/dL (ref 0.3–1.2)

## 2012-12-14 LAB — LIPID PANEL
HDL: 38.3 mg/dL — ABNORMAL LOW (ref >39.00)
LDL Cholesterol: 91 mg/dL (ref 0–99)
VLDL: 15.8 mg/dL (ref 0.0–40.0)

## 2012-12-14 LAB — BASIC METABOLIC PANEL
Chloride: 104 mEq/L (ref 96–112)
GFR: 82.59 mL/min (ref >60.00)
Glucose, Bld: 93 mg/dL (ref 70–99)
Potassium: 4.6 mEq/L (ref 3.5–5.1)
Sodium: 137 mEq/L (ref 135–145)

## 2012-12-14 MED ORDER — AMLODIPINE BESYLATE 5 MG PO TABS: 2.5000 mg | ORAL_TABLET | Freq: Every day | ORAL | Status: DC

## 2012-12-14 MED ORDER — NADOLOL 40 MG PO TABS: ORAL_TABLET | ORAL | Status: DC

## 2012-12-14 MED ORDER — NIACIN ER (ANTIHYPERLIPIDEMIC) 500 MG PO TBCR: 1000.0000 mg | EXTENDED_RELEASE_TABLET | Freq: Every day | ORAL | Status: DC

## 2012-12-14 MED ORDER — AMLODIPINE BESYLATE 5 MG PO TABS: 5.0000 mg | ORAL_TABLET | Freq: Every day | ORAL | Status: DC

## 2012-12-14 MED ORDER — ISOSORBIDE MONONITRATE ER 60 MG PO TB24: 60.0000 mg | ORAL_TABLET | Freq: Every day | ORAL | Status: DC

## 2012-12-14 MED ORDER — ROSUVASTATIN CALCIUM 10 MG PO TABS: ORAL_TABLET | ORAL | Status: DC

## 2012-12-14 NOTE — Telephone Encounter (Signed)
Pharm would like clarification on these 2 med instructions that were sent today. Pls call Samantha at [facility]. amLODipine (NORVASC) 5 MG tablet niacin (NIASPAN) 500 MG CR tablet

## 2012-12-14 NOTE — Assessment & Plan Note (Signed)
lipids monitoringa liver and a lipid were drawn today

## 2012-12-14 NOTE — Telephone Encounter (Signed)
Pharmacy informed- norvasc changed to 2.5 mg per dr Lovell Sheehan today

## 2012-12-14 NOTE — Patient Instructions (Signed)
The patient is instructed to continue all medications as prescribed. Schedule followup with check out clerk upon leaving the clinic  Change Norvasc from 5 mg to 2.5 mg

## 2012-12-14 NOTE — Assessment & Plan Note (Signed)
Stable hypertension stable angina decrease amlodipine 2.5 mg by mouth daily

## 2012-12-14 NOTE — Progress Notes (Signed)
Subjective:    Patient ID: Martin James, male    DOB: [birth year], 72 y.o.   MRN: [id number] Acute complaint of injury to his right hand at the base of the 5th digit.  Coronary Artery Disease Symptoms include chest pressure and dizziness. Pertinent negatives include no chest pain, leg swelling or shortness of breath. Risk factors include hyperlipidemia and hypertension. The symptoms have been stable. Compliance with diet is good. Compliance with exercise is good. Compliance with medications is good.  Hyperlipidemia This is a chronic problem. The current episode started more than 1 year ago. The problem is controlled. Recent lipid tests were reviewed and are normal. Exacerbating diseases include liver disease. Pertinent negatives include no chest pain, focal weakness, leg pain, myalgias or shortness of breath. Current antihyperlipidemic treatment includes nicotinic acid and statins. The current treatment provides significant improvement of lipids. There are no compliance problems.  Risk factors for coronary artery disease include male sex, hypertension, dyslipidemia and family history.  Hypertension This is a chronic problem. The current episode started more than 1 year ago. The problem is unchanged. The problem is controlled. Pertinent negatives include no chest pain, neck pain or shortness of breath. Risk factors for coronary artery disease include dyslipidemia and family history. Past treatments include beta blockers, calcium channel blockers and direct vasodilators. The current treatment provides significant (mild hypotension) improvement. There are no compliance problems.       Review of Systems  Constitutional: Negative for fever and fatigue.  HENT: Negative for hearing loss, congestion, neck pain and postnasal drip.   Eyes: Negative for discharge, redness and visual disturbance.  Respiratory: Negative for cough, shortness of breath and wheezing.   Cardiovascular: Negative for chest pain and  leg swelling.  Gastrointestinal: Negative for abdominal pain, constipation and abdominal distention.  Genitourinary: Negative for urgency and frequency.  Musculoskeletal: Negative for myalgias, joint swelling and arthralgias.  Skin: Negative for color change and rash.  Neurological: Positive for dizziness. Negative for focal weakness, weakness and light-headedness.  Hematological: Negative for adenopathy.  Psychiatric/Behavioral: Negative for behavioral problems.   Past Medical History  Diagnosis Date  . CAD (coronary artery disease)   . Hyperlipidemia   . Hypertension   . Depression   . Insomnia     History   Social History  . Marital Status: Married    Spouse Name: N/A    Number of Children: N/A  . Years of Education: N/A   Occupational History  . retired    Social History Main Topics  . Smoking status: Former Smoker    Quit date: 08/05/1990  . Smokeless tobacco: Not on file  . Alcohol Use: 1.2 oz/week    1 Glasses of wine, 1 Cans of beer per week  . Drug Use: Not on file  . Sexually Active: Yes   Other Topics Concern  . Not on file   Social History Narrative  . No narrative on file    Past Surgical History  Procedure Laterality Date  . Appendectomy    . Undescended testicle surgery      Family History  Problem Relation Age of Onset  . Diabetes Father   . Stroke Father     No Known Allergies  Current Outpatient Prescriptions on File Prior to Visit  Medication Sig Dispense Refill  . aspirin 81 MG tablet Take 81 mg by mouth daily.        Marland Kitchen zolpidem (AMBIEN) 10 MG tablet Take 1 tablet (10 mg total) by mouth at  bedtime as needed for sleep.  90 tablet  1   No current facility-administered medications on file prior to visit.    BP 106/70  Pulse 56  Temp(Src) 98.2 F (36.8 C)  Resp 16  Ht 5\' 9"  (1.753 m)  Wt 160 lb (72.576 kg)  BMI 23.62 kg/m2       Objective:   Physical Exam  Constitutional: He is oriented to person, place, and time. He  appears well-developed and well-nourished.  HENT:  Head: Normocephalic and atraumatic.  Eyes: Conjunctivae are normal. Pupils are equal, round, and reactive to light.  Neck: Normal range of motion. Neck supple.  Cardiovascular: Normal rate and regular rhythm.   Murmur heard. Pulmonary/Chest: Effort normal and breath sounds normal.  Abdominal: Soft. Bowel sounds are normal.  Neurological: He is alert and oriented to person, place, and time.  Skin: Skin is warm and dry.          Assessment & Plan:   Problem focused exam if patient's hand continues to be a problem refer to hand specialist for evaluation of the fracture

## 2012-12-14 NOTE — Assessment & Plan Note (Signed)
Monitor liver functions with a history of fatty liver but with weight loss I believe that his liver functions within normal

## 2013-05-10 DIAGNOSIS — Z23 Encounter for immunization: Secondary | ICD-10-CM | POA: Diagnosis not present

## 2013-06-25 ENCOUNTER — Encounter: Payer: BC Managed Care – PPO | Admitting: Internal Medicine

## 2013-06-28 ENCOUNTER — Encounter (HOSPITAL_COMMUNITY): Payer: Self-pay

## 2013-06-28 ENCOUNTER — Encounter (HOSPITAL_COMMUNITY): Payer: Self-pay | Admitting: Pharmacy Technician

## 2013-06-28 ENCOUNTER — Encounter (HOSPITAL_COMMUNITY)
Admission: RE | Admit: 2013-06-28 | Discharge: 2013-06-28 | Disposition: A | Discharge status: Home / Self Care | Payer: Medicare Other | Source: Ambulatory Visit | Attending: Ophthalmology | Admitting: Ophthalmology

## 2013-06-28 ENCOUNTER — Other Ambulatory Visit: Payer: Self-pay

## 2013-06-28 DIAGNOSIS — H2589 Other age-related cataract: Secondary | ICD-10-CM | POA: Diagnosis not present

## 2013-06-28 DIAGNOSIS — Z01812 Encounter for preprocedural laboratory examination: Secondary | ICD-10-CM | POA: Insufficient documentation

## 2013-06-28 DIAGNOSIS — Z01818 Encounter for other preprocedural examination: Secondary | ICD-10-CM | POA: Diagnosis not present

## 2013-06-28 DIAGNOSIS — B399 Histoplasmosis, unspecified: Secondary | ICD-10-CM | POA: Diagnosis not present

## 2013-06-28 DIAGNOSIS — IMO0002 Reserved for concepts with insufficient information to code with codable children: Secondary | ICD-10-CM | POA: Diagnosis not present

## 2013-06-28 DIAGNOSIS — Z01810 Encounter for preprocedural cardiovascular examination: Secondary | ICD-10-CM | POA: Insufficient documentation

## 2013-06-28 LAB — BASIC METABOLIC PANEL
CO2: 30 mEq/L (ref 19–32)
Calcium: 9.6 mg/dL (ref 8.4–10.5)
Creatinine, Ser: 0.97 mg/dL (ref 0.50–1.35)
Glucose, Bld: 111 mg/dL — ABNORMAL HIGH (ref 70–99)
Sodium: 137 mEq/L (ref 135–145)

## 2013-06-28 LAB — HEMOGLOBIN AND HEMATOCRIT, BLOOD
HCT: 40 % (ref 39.0–52.0)
Hemoglobin: 13.9 g/dL (ref 13.0–17.0)

## 2013-06-28 NOTE — Patient Instructions (Signed)
Your procedure is scheduled on: 07/08/2013  Report to [facility] at  1120  AM.  Call this number if you have problems the morning of surgery: [phone]   Do not eat food or drink liquids :After Midnight.      Take these medicines the morning of surgery with A SIP OF WATER: norvasc, imdur, corgard   Do not wear jewelry, make-up or nail polish.  Do not wear lotions, powders, or perfumes.   Do not shave 48 hours prior to surgery.  Do not bring valuables to the hospital.  Contacts, dentures or bridgework may not be worn into surgery.  Leave suitcase in the car. After surgery it may be brought to your room.  For patients admitted to the hospital, checkout time is 11:00 AM the day of discharge.   Patients discharged the day of surgery will not be allowed to drive home.  :     Please read over the following fact sheets that you were given: Coughing and Deep Breathing, Surgical Site Infection Prevention, Anesthesia Post-op Instructions and Care and Recovery After Surgery    Cataract A cataract is a clouding of the lens of the eye. When a lens becomes cloudy, vision is reduced based on the degree and nature of the clouding. Many cataracts reduce vision to some degree. Some cataracts make people more near-sighted as they develop. Other cataracts increase glare. Cataracts that are ignored and become worse can sometimes look white. The white color can be seen through the pupil. CAUSES   Aging. However, cataracts may occur at any age, even in newborns.   Certain drugs.   Trauma to the eye.   Certain diseases such as diabetes.   Specific eye diseases such as chronic inflammation inside the eye or a sudden attack of a rare form of glaucoma.   Inherited or acquired medical problems.  SYMPTOMS   Gradual, progressive drop in vision in the affected eye.   Severe, rapid visual loss. This most often happens when trauma is the cause.  DIAGNOSIS  To detect a cataract, an eye doctor examines the  lens. Cataracts are best diagnosed with an exam of the eyes with the pupils enlarged (dilated) by drops.  TREATMENT  For an early cataract, vision may improve by using different eyeglasses or stronger lighting. If that does not help your vision, surgery is the only effective treatment. A cataract needs to be surgically removed when vision loss interferes with your everyday activities, such as driving, reading, or watching TV. A cataract may also have to be removed if it prevents examination or treatment of another eye problem. Surgery removes the cloudy lens and usually replaces it with a substitute lens (intraocular lens, IOL).  At a time when both you and your doctor agree, the cataract will be surgically removed. If you have cataracts in both eyes, only one is usually removed at a time. This allows the operated eye to heal and be out of danger from any possible problems after surgery (such as infection or poor wound healing). In rare cases, a cataract may be doing damage to your eye. In these cases, your caregiver may advise surgical removal right away. The vast majority of people who have cataract surgery have better vision afterward. HOME CARE INSTRUCTIONS  If you are not planning surgery, you may be asked to do the following:  Use different eyeglasses.   Use stronger or brighter lighting.   Ask your eye doctor about reducing your medicine dose or changing  medicines if it is thought that a medicine caused your cataract. Changing medicines does not make the cataract go away on its own.   Become familiar with your surroundings. Poor vision can lead to injury. Avoid bumping into things on the affected side. You are at a higher risk for tripping or falling.   Exercise extreme care when driving or operating machinery.   Wear sunglasses if you are sensitive to bright light or experiencing problems with glare.  SEEK IMMEDIATE MEDICAL CARE IF:   You have a worsening or sudden vision loss.   You  notice redness, swelling, or increasing pain in the eye.   You have a fever.  Document Released: 07/22/2005 Document Revised: 07/11/2011 Document Reviewed: 03/15/2011 [facility] Patient Information 2012 [email or web address] INSTRUCTIONS POST-ANESTHESIA  IMMEDIATELY FOLLOWING SURGERY:  Do not drive or operate machinery for the first twenty four hours after surgery.  Do not make any important decisions for twenty four hours after surgery or while taking narcotic pain medications or sedatives.  If you develop intractable nausea and vomiting or a severe headache please notify your doctor immediately.  FOLLOW-UP:  Please make an appointment with your surgeon as instructed. You do not need to follow up with anesthesia unless specifically instructed to do so.  WOUND CARE INSTRUCTIONS (if applicable):  Keep a dry clean dressing on the anesthesia/puncture wound site if there is drainage.  Once the wound has quit draining you may leave it open to air.  Generally you should leave the bandage intact for twenty four hours unless there is drainage.  If the epidural site drains for more than 36-48 hours please call the anesthesia department.  QUESTIONS?:  Please feel free to call your physician or the hospital operator if you have any questions, and they will be happy to assist you.

## 2013-07-07 MED ORDER — TETRACAINE HCL 0.5 % OP SOLN
OPHTHALMIC | Status: AC
  Filled 2013-07-07: qty 2

## 2013-07-07 MED ORDER — CYCLOPENTOLATE-PHENYLEPHRINE OP SOLN OPTIME - NO CHARGE
OPHTHALMIC | Status: AC
  Filled 2013-07-07: qty 2

## 2013-07-07 MED ORDER — PHENYLEPHRINE HCL 2.5 % OP SOLN
OPHTHALMIC | Status: AC
  Filled 2013-07-07: qty 15

## 2013-07-07 MED ORDER — NEOMYCIN-POLYMYXIN-DEXAMETH 3.5-10000-0.1 OP SUSP
OPHTHALMIC | Status: AC
  Filled 2013-07-07: qty 5

## 2013-07-08 ENCOUNTER — Encounter (HOSPITAL_COMMUNITY): Payer: Medicare Other | Admitting: Anesthesiology

## 2013-07-08 ENCOUNTER — Ambulatory Visit (HOSPITAL_COMMUNITY)
Admission: RE | Admit: 2013-07-08 | Discharge: 2013-07-08 | Disposition: A | Discharge status: Home / Self Care | Payer: Medicare Other | Source: Ambulatory Visit | Attending: Ophthalmology | Admitting: Ophthalmology

## 2013-07-08 ENCOUNTER — Encounter (HOSPITAL_COMMUNITY): Payer: Self-pay | Admitting: Ophthalmology

## 2013-07-08 ENCOUNTER — Ambulatory Visit (HOSPITAL_COMMUNITY): Payer: Medicare Other | Admitting: Anesthesiology

## 2013-07-08 ENCOUNTER — Encounter (HOSPITAL_COMMUNITY)
Admission: RE | Disposition: A | Discharge status: Home / Self Care | Payer: Self-pay | Source: Ambulatory Visit | Attending: Ophthalmology

## 2013-07-08 DIAGNOSIS — H2589 Other age-related cataract: Secondary | ICD-10-CM | POA: Diagnosis not present

## 2013-07-08 DIAGNOSIS — H269 Unspecified cataract: Secondary | ICD-10-CM | POA: Diagnosis not present

## 2013-07-08 HISTORY — PX: CATARACT EXTRACTION W/PHACO: SHX586

## 2013-07-08 SURGERY — PHACOEMULSIFICATION, CATARACT, WITH IOL INSERTION
Anesthesia: Monitor Anesthesia Care | Site: Eye | Laterality: Right

## 2013-07-08 MED ORDER — EPINEPHRINE HCL 1 MG/ML IJ SOLN
INTRAOCULAR | Status: DC | PRN
  Administered 2013-07-08: 13:00:00

## 2013-07-08 MED ORDER — LIDOCAINE HCL 3.5 % OP GEL
1.0000 "application " | Freq: Once | OPHTHALMIC | Status: AC
  Administered 2013-07-08: 1 via OPHTHALMIC
  Filled 2013-07-08: qty 1

## 2013-07-08 MED ORDER — LACTATED RINGERS IV SOLN
INTRAVENOUS | Status: DC
  Administered 2013-07-08: 1000 mL via INTRAVENOUS

## 2013-07-08 MED ORDER — PROVISC 10 MG/ML IO SOLN
INTRAOCULAR | Status: DC | PRN
  Administered 2013-07-08: 0.85 mL via INTRAOCULAR

## 2013-07-08 MED ORDER — POVIDONE-IODINE 5 % OP SOLN
OPHTHALMIC | Status: DC | PRN
  Administered 2013-07-08: 1 via OPHTHALMIC

## 2013-07-08 MED ORDER — BSS IO SOLN
INTRAOCULAR | Status: DC | PRN
  Administered 2013-07-08: 15 mL via INTRAOCULAR

## 2013-07-08 MED ORDER — NEOMYCIN-POLYMYXIN-DEXAMETH 3.5-10000-0.1 OP SUSP
OPHTHALMIC | Status: DC | PRN
  Administered 2013-07-08: 2 [drp] via OPHTHALMIC

## 2013-07-08 MED ORDER — MIDAZOLAM HCL 2 MG/2ML IJ SOLN
1.0000 mg | INTRAMUSCULAR | Status: DC | PRN
  Administered 2013-07-08: 2 mg via INTRAVENOUS

## 2013-07-08 MED ORDER — PHENYLEPHRINE HCL 2.5 % OP SOLN
1.0000 [drp] | OPHTHALMIC | Status: AC
  Administered 2013-07-08 (×3): 1 [drp] via OPHTHALMIC

## 2013-07-08 MED ORDER — FENTANYL CITRATE 0.05 MG/ML IJ SOLN
25.0000 ug | INTRAMUSCULAR | Status: AC
Start: 2013-07-08 — End: 2013-07-08
  Administered 2013-07-08: 25 ug via INTRAVENOUS

## 2013-07-08 MED ORDER — TETRACAINE HCL 0.5 % OP SOLN
1.0000 [drp] | OPHTHALMIC | Status: AC
  Administered 2013-07-08 (×3): 1 [drp] via OPHTHALMIC

## 2013-07-08 MED ORDER — FENTANYL CITRATE 0.05 MG/ML IJ SOLN
INTRAMUSCULAR | Status: AC
  Filled 2013-07-08: qty 2

## 2013-07-08 MED ORDER — MIDAZOLAM HCL 2 MG/2ML IJ SOLN
INTRAMUSCULAR | Status: AC
  Filled 2013-07-08: qty 2

## 2013-07-08 MED ORDER — EPINEPHRINE HCL 1 MG/ML IJ SOLN
INTRAMUSCULAR | Status: AC
  Filled 2013-07-08: qty 1

## 2013-07-08 MED ORDER — LIDOCAINE HCL (PF) 1 % IJ SOLN
INTRAMUSCULAR | Status: DC | PRN
  Administered 2013-07-08: .3 mL

## 2013-07-08 MED ORDER — CYCLOPENTOLATE-PHENYLEPHRINE 0.2-1 % OP SOLN
1.0000 [drp] | OPHTHALMIC | Status: AC
  Administered 2013-07-08 (×3): 1 [drp] via OPHTHALMIC

## 2013-07-08 SURGICAL SUPPLY — 32 items
CAPSULAR TENSION RING-AMO (OPHTHALMIC RELATED) IMPLANT
CLOTH BEACON ORANGE TIMEOUT ST (SAFETY) ×1 IMPLANT
EYE SHIELD UNIVERSAL CLEAR (GAUZE/BANDAGES/DRESSINGS) ×1 IMPLANT
GLOVE BIO SURGEON STRL SZ 6.5 (GLOVE) IMPLANT
GLOVE BIOGEL PI IND STRL 6.5 (GLOVE) IMPLANT
GLOVE BIOGEL PI IND STRL 7.0 (GLOVE) IMPLANT
GLOVE BIOGEL PI IND STRL 7.5 (GLOVE) IMPLANT
GLOVE BIOGEL PI INDICATOR 6.5 (GLOVE)
GLOVE BIOGEL PI INDICATOR 7.0 (GLOVE) ×1
GLOVE BIOGEL PI INDICATOR 7.5 (GLOVE)
GLOVE ECLIPSE 6.5 STRL STRAW (GLOVE) IMPLANT
GLOVE ECLIPSE 7.0 STRL STRAW (GLOVE) IMPLANT
GLOVE ECLIPSE 7.5 STRL STRAW (GLOVE) IMPLANT
GLOVE EXAM NITRILE LRG STRL (GLOVE) IMPLANT
GLOVE EXAM NITRILE MD LF STRL (GLOVE) ×1 IMPLANT
GLOVE SKINSENSE NS SZ6.5 (GLOVE)
GLOVE SKINSENSE NS SZ7.0 (GLOVE)
GLOVE SKINSENSE STRL SZ6.5 (GLOVE) IMPLANT
GLOVE SKINSENSE STRL SZ7.0 (GLOVE) IMPLANT
KIT VITRECTOMY (OPHTHALMIC RELATED) IMPLANT
PAD ARMBOARD 7.5X6 YLW CONV (MISCELLANEOUS) ×1 IMPLANT
PROC W NO LENS (INTRAOCULAR LENS)
PROC W SPEC LENS (INTRAOCULAR LENS)
PROCESS W NO LENS (INTRAOCULAR LENS) IMPLANT
PROCESS W SPEC LENS (INTRAOCULAR LENS) IMPLANT
RING MALYGIN (MISCELLANEOUS) IMPLANT
SIGHTPATH CAT PROC W REG LENS (Ophthalmic Related) ×2 IMPLANT
SYR TB 1ML LL NO SAFETY (SYRINGE) ×1 IMPLANT
TAPE SURG TRANSPORE 1 IN (GAUZE/BANDAGES/DRESSINGS) IMPLANT
TAPE SURGICAL TRANSPORE 1 IN (GAUZE/BANDAGES/DRESSINGS) ×1
VISCOELASTIC ADDITIONAL (OPHTHALMIC RELATED) IMPLANT
WATER STERILE IRR 250ML POUR (IV SOLUTION) ×2 IMPLANT

## 2013-07-08 NOTE — Op Note (Signed)
Date of Admission: 07/08/2013  Date of Surgery: 07/08/2013  Pre-Op Dx: Cataract  Right  Eye  Post-Op Dx: Cataract  Right  Eye,  Dx Code 366.19  Surgeon: Gemma Payor, M.D.  Assistants: None  Anesthesia: Topical with MAC  Indications: Painless, progressive loss of vision with compromise of daily activities.  Surgery: Cataract Extraction with Intraocular lens Implant Right Eye  Discription: The patient had dilating drops and viscous lidocaine placed into the right eye in the pre-op holding area. After transfer to the operating room, a time out was performed. The patient was then prepped and draped. Beginning with a 75 degree blade a paracentesis port was made at the surgeon's 2 o'clock position. The anterior chamber was then filled with 1% non-preserved lidocaine. This was followed by filling the anterior chamber with Provisc.  A 2.102mm keratome blade was used to make a clear corneal incision at the temporal limbus.  A bent cystatome needle was used to create a continuous tear capsulotomy. Hydrodissection was performed with balanced salt solution on a Fine canula. The lens nucleus was then removed using the phacoemulsification handpiece. Residual cortex was removed with the I&A handpiece. The anterior chamber and capsular bag were refilled with Provisc. A posterior chamber intraocular lens was placed into the capsular bag with it's injector. The implant was positioned with the Kuglan hook. The Provisc was then removed from the anterior chamber and capsular bag with the I&A handpiece. Stromal hydration of the main incision and paracentesis port was performed with BSS on a Fine canula. The wounds were tested for leak which was negative. The patient tolerated the procedure well. There were no operative complications. The patient was then transferred to the recovery room in stable condition.  Complications: None  Specimen: None  EBL: None  Prosthetic device: B&L enVista, MX60, power 22.5D, SN  [id number].

## 2013-07-08 NOTE — H&P (Signed)
I have reviewed the H&P, the patient was re-examined, and I have identified no interval changes in medical condition and plan of care since the history and physical of record  

## 2013-07-08 NOTE — Anesthesia Preprocedure Evaluation (Signed)
Anesthesia Evaluation  Patient identified by MRN, date of birth, ID band Patient awake    Reviewed: Allergy & Precautions, H&P , NPO status , Patient's Chart, lab work & pertinent test results, reviewed documented beta blocker date and time   Airway Mallampati: II TM Distance: >3 FB     Dental  (+) Missing, Loose, Poor Dentition, Chipped and Dental Advisory Given   Pulmonary former smoker,  breath sounds clear to auscultation        Cardiovascular hypertension, Pt. on home beta blockers and Pt. on medications - angina+ CAD and + Cardiac Stents Rhythm:Regular Rate:Normal     Neuro/Psych PSYCHIATRIC DISORDERS Depression  Neuromuscular disease    GI/Hepatic negative GI ROS,   Endo/Other    Renal/GU      Musculoskeletal   Abdominal   Peds  Hematology   Anesthesia Other Findings   Reproductive/Obstetrics                           Anesthesia Physical Anesthesia Plan  ASA: III  Anesthesia Plan: MAC   Post-op Pain Management:    Induction: Intravenous  Airway Management Planned: Nasal Cannula  Additional Equipment:   Intra-op Plan:   Post-operative Plan:   Informed Consent: I have reviewed the patients History and Physical, chart, labs and discussed the procedure including the risks, benefits and alternatives for the proposed anesthesia with the patient or authorized representative who has indicated his/her understanding and acceptance.     Plan Discussed with:   Anesthesia Plan Comments:         Anesthesia Quick Evaluation

## 2013-07-08 NOTE — Anesthesia Postprocedure Evaluation (Signed)
  Anesthesia Post-op Note  Patient: Martin James  Procedure(s) Performed: Procedure(s) with comments: CATARACT EXTRACTION PHACO AND INTRAOCULAR LENS PLACEMENT (IOC) (Right) - CDE 26.34  Patient Location: Short Stay  Anesthesia Type:MAC  Level of Consciousness: awake, alert , oriented and patient cooperative  Airway and Oxygen Therapy: Patient Spontanous Breathing  Post-op Pain: none  Post-op Assessment: Post-op Vital signs reviewed, Patient's Cardiovascular Status Stable, Respiratory Function Stable, Patent Airway and Pain level controlled  Post-op Vital Signs: Reviewed and stable  Complications: No apparent anesthesia complications

## 2013-07-08 NOTE — Transfer of Care (Signed)
Immediate Anesthesia Transfer of Care Note  Patient: Martin James  Procedure(s) Performed: Procedure(s) with comments: CATARACT EXTRACTION PHACO AND INTRAOCULAR LENS PLACEMENT (IOC) (Right) - CDE 26.34  Patient Location: Short Stay  Anesthesia Type:MAC  Level of Consciousness: awake, alert , oriented and patient cooperative  Airway & Oxygen Therapy: Patient Spontanous Breathing  Post-op Assessment: Report given to PACU RN, Post -op Vital signs reviewed and stable and Patient moving all extremities  Post vital signs: Reviewed and stable  Complications: No apparent anesthesia complications

## 2013-07-12 ENCOUNTER — Encounter (HOSPITAL_COMMUNITY): Payer: Self-pay | Admitting: Ophthalmology

## 2013-07-26 ENCOUNTER — Encounter: Payer: BC Managed Care – PPO | Admitting: Internal Medicine

## 2013-08-16 ENCOUNTER — Ambulatory Visit (INDEPENDENT_AMBULATORY_CARE_PROVIDER_SITE_OTHER): Payer: Medicare Other | Admitting: Internal Medicine

## 2013-08-16 ENCOUNTER — Encounter: Payer: Self-pay | Admitting: Internal Medicine

## 2013-08-16 VITALS — BP 120/80 | HR 72 | Temp 98.3°F | Resp 16 | Ht 69.0 in | Wt 156.0 lb

## 2013-08-16 DIAGNOSIS — Z1211 Encounter for screening for malignant neoplasm of colon: Secondary | ICD-10-CM | POA: Diagnosis not present

## 2013-08-16 DIAGNOSIS — E785 Hyperlipidemia, unspecified: Secondary | ICD-10-CM

## 2013-08-16 DIAGNOSIS — Z0000 Encounter for general adult medical examination without abnormal findings: Secondary | ICD-10-CM | POA: Diagnosis not present

## 2013-08-16 DIAGNOSIS — T887XXA Unspecified adverse effect of drug or medicament, initial encounter: Secondary | ICD-10-CM

## 2013-08-16 LAB — COMPREHENSIVE METABOLIC PANEL
ALBUMIN: 3.8 g/dL (ref 3.5–5.2)
ALT: 23 U/L (ref 0–53)
AST: 22 U/L (ref 0–37)
Alkaline Phosphatase: 48 U/L (ref 39–117)
BUN: 17 mg/dL (ref 6–23)
CALCIUM: 9.1 mg/dL (ref 8.4–10.5)
CHLORIDE: 102 meq/L (ref 96–112)
CO2: 28 meq/L (ref 19–32)
Creatinine, Ser: 1 mg/dL (ref 0.4–1.5)
GFR: 80.48 mL/min (ref >60.00)
GLUCOSE: 105 mg/dL — AB (ref 70–99)
POTASSIUM: 5.1 meq/L (ref 3.5–5.1)
Sodium: 138 mEq/L (ref 135–145)
Total Bilirubin: 1.1 mg/dL (ref 0.3–1.2)
Total Protein: 7.2 g/dL (ref 6.0–8.3)

## 2013-08-16 LAB — TSH: TSH: 0.75 u[IU]/mL (ref 0.35–5.50)

## 2013-08-16 LAB — LIPID PANEL
CHOLESTEROL: 151 mg/dL (ref 0–200)
HDL: 37 mg/dL — ABNORMAL LOW (ref >39.00)
LDL Cholesterol: 98 mg/dL (ref 0–99)
Total CHOL/HDL Ratio: 4
Triglycerides: 82 mg/dL (ref 0.0–149.0)
VLDL: 16.4 mg/dL (ref 0.0–40.0)

## 2013-08-16 MED ORDER — CARVEDILOL 25 MG PO TABS: 12.5000 mg | ORAL_TABLET | Freq: Two times a day (BID) | ORAL | Status: DC

## 2013-08-16 NOTE — Progress Notes (Signed)
Subjective:    Patient ID: Martin James, male    DOB: 1941/06/29, 73 y.o.   MRN: [id number]  HPI Medicare wellness exam  Is followed for hypertension osteoarthritis and ibuprofen when necessary he has a history of heart disease and takes isosorbide amlodipine Crestor and Niaspan. I would recommend that the niacin in context of the Crestor and his history of CAD.  He is a former smoker who has been a nonsmoker since 1992 He has been healthy and has kept his  weight in control    Review of Systems  Constitutional: Negative for fever and fatigue.  HENT: Negative for congestion, hearing loss and postnasal drip.   Eyes: Negative for discharge, redness and visual disturbance.  Respiratory: Negative for cough, shortness of breath and wheezing.   Cardiovascular: Negative for leg swelling.  Gastrointestinal: Negative for abdominal pain, constipation and abdominal distention.  Genitourinary: Negative for urgency and frequency.  Musculoskeletal: Negative for arthralgias, joint swelling and neck pain.  Skin: Negative for color change and rash.  Neurological: Negative for weakness and light-headedness.  Hematological: Negative for adenopathy.  Psychiatric/Behavioral: Negative for behavioral problems.       Past Medical History  Diagnosis Date  . CAD (coronary artery disease)   . Hyperlipidemia   . Hypertension   . Depression   . Insomnia     History   Social History  . Marital Status: Married    Spouse Name: N/A    Number of Children: N/A  . Years of Education: N/A   Occupational History  . retired    Social History Main Topics  . Smoking status: Former Smoker -- 0.25 packs/day for 30 years    Types: Cigarettes    Quit date: 08/05/1990  . Smokeless tobacco: Not on file  . Alcohol Use: 1.2 oz/week    1 Glasses of wine, 1 Cans of beer per week     Comment: occassional  . Drug Use: Not on file  . Sexual Activity: Yes    Birth Control/ Protection: None   Other Topics  Concern  . Not on file   Social History Narrative  . No narrative on file    Past Surgical History  Procedure Laterality Date  . Appendectomy    . Undescended testicle surgery    . Coronary angioplasty      srents x4  . Cataract extraction w/phaco Right 07/08/2013    Procedure: CATARACT EXTRACTION PHACO AND INTRAOCULAR LENS PLACEMENT (IOC);  Surgeon: Gemma Payor, MD;  Location: [facility];  Service: Ophthalmology;  Laterality: Right;  CDE 26.34    Family History  Problem Relation Age of Onset  . Diabetes Father   . Stroke Father     No Known Allergies  Current Outpatient Prescriptions on File Prior to Visit  Medication Sig Dispense Refill  . amLODipine (NORVASC) 2.5 MG tablet Take 2.5 mg by mouth daily.      Marland Kitchen aspirin 81 MG tablet Take 81 mg by mouth daily.        Marland Kitchen ibuprofen (ADVIL,MOTRIN) 200 MG tablet Take 200 mg by mouth every 6 (six) hours as needed for moderate pain.      . isosorbide mononitrate (IMDUR) 60 MG 24 hr tablet Take 1 tablet (60 mg total) by mouth daily.  90 tablet  3  . nadolol (CORGARD) 40 MG tablet TAKE 1 TABLET BY MOUTH EVERY DAY  90 tablet  3  . niacin (NIASPAN) 500 MG CR tablet Take 2 tablets (1,000 mg total)  by mouth at bedtime.  90 tablet  3  . rosuvastatin (CRESTOR) 10 MG tablet TAKE 1 TABLET BY MOUTH EVERY DAY  90 tablet  3   No current facility-administered medications on file prior to visit.    BP 120/80  Pulse 72  Temp(Src) 98.3 F (36.8 C)  Resp 16  Ht 5\' 9"  (1.753 m)  Wt 156 lb (70.761 kg)  BMI 23.03 kg/m2    Objective:   Physical Exam  Constitutional: He is oriented to person, place, and time. He appears well-developed and well-nourished.  HENT:  Head: Normocephalic and atraumatic.  Eyes: Conjunctivae are normal. Pupils are equal, round, and reactive to light.  Neck: Normal range of motion. Neck supple.  Cardiovascular: Normal rate and regular rhythm.   Murmur heard. Pulmonary/Chest: Effort normal and breath sounds normal.    Abdominal: Soft. Bowel sounds are normal.  Genitourinary:  elarged  Musculoskeletal: Normal range of motion. He exhibits tenderness.  Neurological: He is alert and oriented to person, place, and time.  Skin: Skin is dry.  Psychiatric: He has a normal mood and affect.          Assessment & Plan:  Her blood pressure low-dose Norvasc.  Stable cardiovascular on isosorbide Corgard and Norvasc  Monitoring for lipids on Niaspan and Crestor combination monitor for renal function and liver function with a history of fatty liver result since the patient had significant weight loss  Nadolol is 3rd tier Change  To coreg  Subjective:    Martin James is a 73 y.o. male who presents for Medicare Annual/Subsequent preventive examination.   Preventive Screening-Counseling & Management  Tobacco History  Smoking status  . Former Smoker -- 0.25 packs/day for 30 years  . Types: Cigarettes  . Quit date: 08/05/1990  Smokeless tobacco  . Not on file    Problems Prior to Visit 1.   Current Problems (verified) Patient Active Problem List   Diagnosis Date Noted  . INSOMNIA, PERSISTENT 03/03/2007  . DEPRESSION 03/03/2007  . ELEVATION, TRANSAMINASE/LDH LEVELS 03/03/2007  . HYPERLIPIDEMIA 02/17/2007  . SYNDROME, CARPAL TUNNEL 02/17/2007  . HYPERTENSION 02/17/2007  . CORONARY ARTERY DISEASE 02/17/2007  . FATTY LIVER DISEASE 02/17/2007    Medications Prior to Visit Current Outpatient Prescriptions on File Prior to Visit  Medication Sig Dispense Refill  . amLODipine (NORVASC) 2.5 MG tablet Take 2.5 mg by mouth daily.      Marland Kitchen. aspirin 81 MG tablet Take 81 mg by mouth daily.        Marland Kitchen. ibuprofen (ADVIL,MOTRIN) 200 MG tablet Take 200 mg by mouth every 6 (six) hours as needed for moderate pain.      . isosorbide mononitrate (IMDUR) 60 MG 24 hr tablet Take 1 tablet (60 mg total) by mouth daily.  90 tablet  3  . niacin (NIASPAN) 500 MG CR tablet Take 2 tablets (1,000 mg total) by mouth at  bedtime.  90 tablet  3  . rosuvastatin (CRESTOR) 10 MG tablet TAKE 1 TABLET BY MOUTH EVERY DAY  90 tablet  3   No current facility-administered medications on file prior to visit.    Current Medications (verified) Current Outpatient Prescriptions  Medication Sig Dispense Refill  . amLODipine (NORVASC) 2.5 MG tablet Take 2.5 mg by mouth daily.      Marland Kitchen. aspirin 81 MG tablet Take 81 mg by mouth daily.        Marland Kitchen. ibuprofen (ADVIL,MOTRIN) 200 MG tablet Take 200 mg by mouth every 6 (six) hours as  needed for moderate pain.      . isosorbide mononitrate (IMDUR) 60 MG 24 hr tablet Take 1 tablet (60 mg total) by mouth daily.  90 tablet  3  . niacin (NIASPAN) 500 MG CR tablet Take 2 tablets (1,000 mg total) by mouth at bedtime.  90 tablet  3  . rosuvastatin (CRESTOR) 10 MG tablet TAKE 1 TABLET BY MOUTH EVERY DAY  90 tablet  3  . carvedilol (COREG) 25 MG tablet Take 0.5 tablets (12.5 mg total) by mouth 2 (two) times daily with a meal.  60 tablet  11   No current facility-administered medications for this visit.     Allergies (verified) Review of patient's allergies indicates no known allergies.   PAST HISTORY  Family History Family History  Problem Relation Age of Onset  . Diabetes Father   . Stroke Father     Social History History  Substance Use Topics  . Smoking status: Former Smoker -- 0.25 packs/day for 30 years    Types: Cigarettes    Quit date: 08/05/1990  . Smokeless tobacco: Not on file  . Alcohol Use: 1.2 oz/week    1 Glasses of wine, 1 Cans of beer per week     Comment: occassional    Are there smokers in your home (other than you)?  No  Risk Factors Current exercise habits: The patient does not participate in regular exercise at present.  Dietary issues discussed:     Cardiac risk factors: advanced age (older than 16 for men, 91 for women) and dyslipidemia.  Depression Screen (Note: if answer to either of the following is "Yes", a more complete depression screening  is indicated)   Q1: Over the past two weeks, have you felt down, depressed or hopeless? Yes  Q2: Over the past two weeks, have you felt little interest or pleasure in doing things? No  Have you lost interest or pleasure in daily life? No  Do you often feel hopeless? No  Do you cry easily over simple problems? Yes  Activities of Daily Living In your present state of health, do you have any difficulty performing the following activities?:  Driving? No Managing money?  No Feeding yourself? No Getting from bed to chair? No Climbing a flight of stairs? No Preparing food and eating?: No Bathing or showering? No Getting dressed: No Getting to the toilet? No Using the toilet:No Moving around from place to place: No In the past year have you fallen or had a near fall?:No   Are you sexually active?  No  Do you have more than one partner?  No  Hearing Difficulties: No Do you often ask people to speak up or repeat themselves? No Do you experience ringing or noises in your ears? No Do you have difficulty understanding soft or whispered voices? No   Do you feel that you have a problem with memory? No  Do you often misplace items? No  Do you feel safe at home?  Yes  Cognitive Testing  Alert? Yes  Normal Appearance?Yes  Oriented to person? Yes  Place? Yes   Time? Yes  Recall of three objects?  Yes  Can perform simple calculations? Yes  Displays appropriate judgment?Yes  Can read the correct time from a watch face?Yes   Advanced Directives have been discussed with the patient? Yes   List the Names of Other Physician/Practitioners you currently use: 1.    Indicate any recent Medical Services you may have received from other than [facility]  providers in the past year (date may be approximate).  Immunization History  Administered Date(s) Administered  . Influenza Whole 06/04/2007, 06/07/2009, 05/03/2010  . Influenza, High Dose Seasonal PF 05/10/2013  . Pneumococcal Polysaccharide-23  06/04/2007  . Tdap 06/11/2012    Screening Tests Health Maintenance  Topic Date Due  . Colonoscopy  11/28/1989  . Zostavax  11/29/1999  . Influenza Vaccine  03/05/2014  . Tetanus/tdap  06/11/2022  . Pneumococcal Polysaccharide Vaccine Age 53 And Over  Completed    All answers were reviewed with the patient and necessary referrals were made:  Carrie Mew, MD   08/16/2013   History reviewed: allergies, current medications, past family history, past medical history, past social history, past surgical history and problem list  Review of Systems Pertinent items are noted in HPI.    Objective:     Vision by Snellen chart: right eye:20/20, left eye:20/20 Blood pressure 120/80, pulse 72, temperature 98.3 F (36.8 C), resp. rate 16, height 5\' 9"  (1.753 m), weight 156 lb (70.761 kg). Body mass index is 23.03 kg/(m^2). Exam in first section of note     Assessment:      Patient presents for yearly preventative medicine examination.   all immunizations and health maintenance protocols were reviewed with the patient and they are up to date with these protocols.   screening laboratory values were reviewed with the patient including screening of hyperlipidemia PSA renal function and hepatic function.   There medications past medical history social history problem list and allergies were reviewed in detail.   Goals were established with regard to weight loss exercise diet in compliance with medications      Plan:     During the course of the visit the patient was educated and counseled about appropriate screening and preventive services including:    Pneumococcal vaccine   Influenza vaccine  Diet review for nutrition referral? Yes ____  Not Indicated ____   Patient Instructions (the written plan) was given to the patient.  Medicare Attestation I have personally reviewed: The patient's medical and social history Their use of alcohol, tobacco or illicit  drugs Their current medications and supplements The patient's functional ability including ADLs,fall risks, home safety risks, cognitive, and hearing and visual impairment Diet and physical activities Evidence for depression or mood disorders  The patient's weight, height, BMI, and visual acuity have been recorded in the chart.  I have made referrals, counseling, and provided education to the patient based on review of the above and I have provided the patient with a written personalized care plan for preventive services.     Carrie Mew, MD   08/16/2013

## 2013-08-16 NOTE — Patient Instructions (Signed)
Change the nadolol to coreg

## 2013-08-16 NOTE — Progress Notes (Signed)
CINA not available this am 

## 2013-10-08 ENCOUNTER — Encounter: Payer: Self-pay | Admitting: Internal Medicine

## 2013-12-13 ENCOUNTER — Telehealth: Payer: Self-pay | Admitting: Internal Medicine

## 2013-12-13 MED ORDER — AMLODIPINE BESYLATE 2.5 MG PO TABS: 2.5000 mg | ORAL_TABLET | Freq: Every day | ORAL | Status: DC

## 2013-12-13 NOTE — Telephone Encounter (Signed)
[facility] - [place], [place] - [address] is requesting re-fill on amLODipine (NORVASC) 2.5 MG tablet

## 2013-12-13 NOTE — Telephone Encounter (Signed)
rx sent in electronically 

## 2013-12-29 ENCOUNTER — Telehealth: Payer: Self-pay | Admitting: Internal Medicine

## 2013-12-29 DIAGNOSIS — I2510 Atherosclerotic heart disease of native coronary artery without angina pectoris: Secondary | ICD-10-CM

## 2013-12-29 DIAGNOSIS — I10 Essential (primary) hypertension: Secondary | ICD-10-CM

## 2013-12-29 MED ORDER — ISOSORBIDE MONONITRATE ER 60 MG PO TB24: 60.0000 mg | ORAL_TABLET | Freq: Every day | ORAL | Status: DC

## 2013-12-29 NOTE — Telephone Encounter (Signed)
[facility] - [place], [place] - [address] is requesting 90 day re-fill on isosorbide mononitrate (IMDUR) 60 MG 24 hr tablet

## 2013-12-29 NOTE — Telephone Encounter (Signed)
rx sent in electronically 

## 2013-12-30 ENCOUNTER — Telehealth: Payer: Self-pay | Admitting: Internal Medicine

## 2013-12-30 NOTE — Telephone Encounter (Signed)
Relevant patient education assigned to patient using Emmi. ° °

## 2014-02-21 ENCOUNTER — Ambulatory Visit: Payer: Medicare Other | Admitting: Internal Medicine

## 2014-05-21 DIAGNOSIS — Z23 Encounter for immunization: Secondary | ICD-10-CM | POA: Diagnosis not present

## 2014-05-24 ENCOUNTER — Encounter: Payer: Self-pay | Admitting: Gastroenterology

## 2014-05-24 ENCOUNTER — Ambulatory Visit (INDEPENDENT_AMBULATORY_CARE_PROVIDER_SITE_OTHER): Payer: Medicare Other | Admitting: Family Medicine

## 2014-05-24 ENCOUNTER — Encounter: Payer: Self-pay | Admitting: Family Medicine

## 2014-05-24 VITALS — BP 100/70 | Temp 97.9°F | Wt 160.0 lb

## 2014-05-24 DIAGNOSIS — E785 Hyperlipidemia, unspecified: Secondary | ICD-10-CM | POA: Diagnosis not present

## 2014-05-24 DIAGNOSIS — Z1211 Encounter for screening for malignant neoplasm of colon: Secondary | ICD-10-CM

## 2014-05-24 DIAGNOSIS — I2510 Atherosclerotic heart disease of native coronary artery without angina pectoris: Secondary | ICD-10-CM

## 2014-05-24 DIAGNOSIS — R519 Headache, unspecified: Secondary | ICD-10-CM | POA: Insufficient documentation

## 2014-05-24 DIAGNOSIS — R51 Headache: Secondary | ICD-10-CM

## 2014-05-24 DIAGNOSIS — I10 Essential (primary) hypertension: Secondary | ICD-10-CM | POA: Diagnosis not present

## 2014-05-24 MED ORDER — ATORVASTATIN CALCIUM 40 MG PO TABS: 40.0000 mg | ORAL_TABLET | Freq: Every day | ORAL | Status: DC

## 2014-05-24 MED ORDER — CARVEDILOL 12.5 MG PO TABS: 12.5000 mg | ORAL_TABLET | Freq: Two times a day (BID) | ORAL | Status: DC

## 2014-05-24 NOTE — Progress Notes (Signed)
Martin ConchStephen Hunter, MD Phone: [phone]  Subjective:  Patient presents today to establish care with me as their new primary care provider. Patient was formerly a patient of Dr. Lovell SheehanJenkins. Chief complaint-noted.   CAD-asymptomatic and controlled with ASA and medical therapy Hyperlipidemia-well-controlled Lab Results  Component Value Date   LDLCALC 98 08/16/2013  Crestor is very expensive and would like to change. Walks 8 miles a day everyday.  ROS- no chest pain or shortness of breath. No myalgias  Hypertension-controlled BP Readings from Last 3 Encounters:  05/24/14 100/70  08/16/13 120/80  07/08/13 91/61  Compliant with medications-yes without side effects ROS-Denies any CP, HA, SOB, blurry vision, LE edema  The following were reviewed and entered/updated in epic: Past Medical History  Diagnosis Date  . CAD (coronary artery disease)   . Hyperlipidemia   . Hypertension   . Depression   . Insomnia    Patient Active Problem List   Diagnosis Date Noted  . CAD (coronary artery disease) 02/17/2007    Priority: High  . Hyperlipidemia 02/17/2007    Priority: Medium  . Essential hypertension 02/17/2007    Priority: Medium  . Headache 05/24/2014    Priority: Low  . INSOMNIA, PERSISTENT 03/03/2007    Priority: Low  . Depression 03/03/2007    Priority: Low  . SYNDROME, CARPAL TUNNEL 02/17/2007    Priority: Low  . Fatty liver 02/17/2007    Priority: Low   Past Surgical History  Procedure Laterality Date  . Appendectomy    . Undescended testicle surgery    . Coronary angioplasty      srents x4  . Cataract extraction w/phaco Right 07/08/2013    Procedure: CATARACT EXTRACTION PHACO AND INTRAOCULAR LENS PLACEMENT (IOC);  Surgeon: Gemma PayorKerry Hunt, MD;  Location: [facility];  Service: Ophthalmology;  Laterality: Right;  CDE 26.34    Family History  Problem Relation Age of Onset  . Diabetes Father 4460  . Stroke Father     smoker    Medications- reviewed and updated Current  Outpatient Prescriptions  Medication Sig Dispense Refill  . amLODipine (NORVASC) 2.5 MG tablet Take 1 tablet (2.5 mg total) by mouth daily.  90 tablet  2  . aspirin 81 MG tablet Take 81 mg by mouth daily.        . carvedilol (COREG) 25 MG tablet Take 0.5 tablets (12.5 mg total) by mouth 2 (two) times daily with a meal.  60 tablet  11  . ibuprofen (ADVIL,MOTRIN) 200 MG tablet Take 200 mg by mouth every 6 (six) hours as needed for moderate pain.      . isosorbide mononitrate (IMDUR) 60 MG 24 hr tablet Take 1 tablet (60 mg total) by mouth daily.  90 tablet  3  . niacin (NIASPAN) 500 MG CR tablet Take 2 tablets (1,000 mg total) by mouth at bedtime.  90 tablet  3  . rosuvastatin (CRESTOR) 10 MG tablet TAKE 1 TABLET BY MOUTH EVERY DAY  90 tablet  3   No current facility-administered medications for this visit.    Allergies-reviewed and updated No Known Allergies  History   Social History  . Marital Status: Married    Spouse Name: N/A    Number of Children: N/A  . Years of Education: N/A   Occupational History  . retired    Social History Main Topics  . Smoking status: Former Smoker -- 0.25 packs/day for 30 years    Types: Cigarettes    Quit date: 08/05/1990  . Smokeless tobacco: None  .  Alcohol Use: 1.2 oz/week    1 Glasses of wine, 1 Cans of beer per week     Comment: occassional  . Drug Use: No  . Sexual Activity: Yes    Birth Control/ Protection: None   Other Topics Concern  . None   Social History Narrative   Married 1964. 2 kids-son, daughter. 5 grandkids.       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  Ext: no edema Skin: warm, dry, no rash Neuro:  grossly normal, moves all extremities   Assessment/Plan:  CAD (coronary artery disease) Well-controlled with aspirin and statin. No anginal symptoms on Imdur. Continue all meds  Hyperlipidemia Change to atorvastatin 40 mg from Crestor 10 mg due to high cost Crestor. Recheck lipids next visit. Also stop niacin as no clear mortality or morbidity benefit  Essential hypertension Continue amlodipine, carvedilol, Imdur as well controlled   Patient agrees to his first colonoscopy and hopefully final 1 Orders Placed This Encounter  Procedures  . Ambulatory referral to Gastroenterology    Referral Priority:  Routine    Referral Type:  Consultation    Referral Reason:  Specialty Services Required    Requested Specialty:  Gastroenterology    Number of Visits Requested:  1    Meds ordered this encounter  Medications  . carvedilol (COREG) 12.5 MG tablet    Sig: Take 1 tablet (12.5 mg total) by mouth 2 (two) times daily with a meal.    Dispense:  180 tablet    Refill:  3  . atorvastatin (LIPITOR) 40 MG tablet    Sig: Take 1 tablet (40 mg total) by mouth daily.    Dispense:  90 tablet    Refill:  3

## 2014-05-24 NOTE — Assessment & Plan Note (Signed)
Change to atorvastatin 40 mg from Crestor 10 mg due to high cost Crestor. Recheck lipids next visit. Also stop niacin as no clear mortality or morbidity benefit

## 2014-05-24 NOTE — Assessment & Plan Note (Signed)
Well-controlled with aspirin and statin. No anginal symptoms on Imdur. Continue all meds

## 2014-05-24 NOTE — Patient Instructions (Signed)
3-6 month follow up and will test labs.   Stop niacin. Change to atorvastatin from crestor  Change carvedilol to a full 12.5 mg pill instead of 1/2 of a 25 mg pill.

## 2014-05-24 NOTE — Assessment & Plan Note (Signed)
Continue amlodipine, carvedilol, Imdur as well controlled

## 2014-05-27 ENCOUNTER — Ambulatory Visit: Payer: Medicare Other | Admitting: Family Medicine

## 2014-07-06 ENCOUNTER — Ambulatory Visit (AMBULATORY_SURGERY_CENTER): Payer: Self-pay | Admitting: *Deleted

## 2014-07-06 VITALS — Ht 69.0 in | Wt 162.4 lb

## 2014-07-06 DIAGNOSIS — Z1211 Encounter for screening for malignant neoplasm of colon: Secondary | ICD-10-CM

## 2014-07-06 MED ORDER — NA SULFATE-K SULFATE-MG SULF 17.5-3.13-1.6 GM/177ML PO SOLN: 1.0000 | Freq: Once | ORAL | Status: DC

## 2014-07-06 NOTE — Progress Notes (Signed)
No egg or soy allergy. No blood thinners. No diet pills. No issues with past sedation. No problems with constipation. ewm Pt's prep was going to  be 42.00 and he states he cannot pay for this so changed to miralax/gatorade as per protocol. ewm

## 2014-07-12 ENCOUNTER — Encounter: Payer: Self-pay | Admitting: Gastroenterology

## 2014-07-12 ENCOUNTER — Ambulatory Visit (AMBULATORY_SURGERY_CENTER): Payer: Medicare Other | Admitting: Gastroenterology

## 2014-07-12 VITALS — BP 90/56 | HR 66 | Temp 97.4°F | Resp 15 | Ht 69.0 in | Wt 162.0 lb

## 2014-07-12 DIAGNOSIS — K5730 Diverticulosis of large intestine without perforation or abscess without bleeding: Secondary | ICD-10-CM

## 2014-07-12 DIAGNOSIS — K648 Other hemorrhoids: Secondary | ICD-10-CM

## 2014-07-12 DIAGNOSIS — Z1211 Encounter for screening for malignant neoplasm of colon: Secondary | ICD-10-CM | POA: Diagnosis not present

## 2014-07-12 MED ORDER — SODIUM CHLORIDE 0.9 % IV SOLN: 500.0000 mL | INTRAVENOUS | Status: DC

## 2014-07-12 NOTE — Progress Notes (Signed)
A/ox3 pleased with MAC, report to Celia RN 

## 2014-07-12 NOTE — Op Note (Signed)
[facility] [address] [place], [id number]   COLONOSCOPY PROCEDURE REPORT  PATIENT: Martin James, Martin James  MR#: [id number] BIRTHDATE: Feb 07, 1941 , 73  yrs. old GENDER: male ENDOSCOPIST: Louis Meckelobert D Kaplan, MD REFERRED BY: PROCEDURE DATE:  07/12/2014 PROCEDURE:   Colonoscopy, diagnostic First Screening Colonoscopy - Avg.  risk and is 50 yrs.  old or older Yes.  Prior Negative Screening - Now for repeat screening. N/A  History of Adenoma - Now for follow-up colonoscopy & has been > or = to 3 yrs.  N/A  Polyps Removed Today? No.  Recommend repeat exam, <10 yrs? No. ASA CLASS:   Class II INDICATIONS:first colonoscopy. MEDICATIONS: Monitored anesthesia care and Propofol 200 mg IV  DESCRIPTION OF PROCEDURE:   After the risks benefits and alternatives of the procedure were thoroughly explained, informed consent was obtained.  The digital rectal exam revealed no abnormalities of the rectum.   The LB [id number]  endoscope was introduced through the anus and advanced to the cecum, which was identified by both the appendix and ileocecal valve. No adverse events experienced.   The quality of the prep was Suprep fair  The instrument was then slowly withdrawn as the colon was fully examined.      COLON FINDINGS: There was severe diverticulosis noted in the descending colon and sigmoid colon with associated muscular hypertrophy.   There was moderate diverticulosis noted at the cecum, in the ascending colon, and transverse colon.   An angiodysplastic lesion was found at the cecum.   Hemorrhoids were found.  Retroflexed views revealed no abnormalities. The time to cecum=4 minutes 38 seconds.  Withdrawal time=7 minutes 09 seconds. The scope was withdrawn and the procedure completed. COMPLICATIONS: There were no immediate complications.  ENDOSCOPIC IMPRESSION: 1.   There was severe diverticulosis noted in the descending colon and sigmoid colon 2.   There was moderate  diverticulosis noted at the cecum, in the ascending colon, and transverse colon 3.   Angiodysplastic lesion at the cecum 4.   Hemorrhoids  RECOMMENDATIONS: Given your age, you will not need another colonoscopy for colon cancer screening or polyp surveillance.  These types of tests usually stop around the age 74.  eSigned:  Louis Meckelobert D Kaplan, MD 07/12/2014 8:44 AM   cc: Tana ConchStephen Hunter, MD   PATIENT NAME:  Martin James, Martin James MR#: [id number]

## 2014-07-12 NOTE — Patient Instructions (Signed)
Discharge instructions given. Handouts on diverticulosis and hemorrhoids. Resume previous medications. YOU HAD AN ENDOSCOPIC PROCEDURE TODAY AT THE [facility]: Refer to the procedure report that was given to you for any specific questions about what was found during the examination.  If the procedure report does not answer your questions, please call your gastroenterologist to clarify.  If you requested that your care partner not be given the details of your procedure findings, then the procedure report has been included in a sealed envelope for you to review at your convenience later.  YOU SHOULD EXPECT: Some feelings of bloating in the abdomen. Passage of more gas than usual.  Walking can help get rid of the air that was put into your GI tract during the procedure and reduce the bloating. If you had a lower endoscopy (such as a colonoscopy or flexible sigmoidoscopy) you may notice spotting of blood in your stool or on the toilet paper. If you underwent a bowel prep for your procedure, then you may not have a normal bowel movement for a few days.  DIET: Your first meal following the procedure should be a light meal and then it is ok to progress to your normal diet.  A half-sandwich or bowl of soup is an example of a good first meal.  Heavy or fried foods are harder to digest and may make you feel nauseous or bloated.  Likewise meals heavy in dairy and vegetables can cause extra gas to form and this can also increase the bloating.  Drink plenty of fluids but you should avoid alcoholic beverages for 24 hours.  ACTIVITY: Your care partner should take you home directly after the procedure.  You should plan to take it easy, moving slowly for the rest of the day.  You can resume normal activity the day after the procedure however you should NOT DRIVE or use heavy machinery for 24 hours (because of the sedation medicines used during the test).    SYMPTOMS TO REPORT IMMEDIATELY: A  gastroenterologist can be reached at any hour.  During normal business hours, 8:30 AM to 5:00 PM Monday through Friday, call [phone].  After hours and on weekends, please call the GI answering service at [phone] who will take a message and have the physician on call contact you.   Following lower endoscopy (colonoscopy or flexible sigmoidoscopy):  Excessive amounts of blood in the stool  Significant tenderness or worsening of abdominal pains  Swelling of the abdomen that is new, acute  Fever of 100F or higher  FOLLOW UP: If any biopsies were taken you will be contacted by phone or by letter within the next 1-3 weeks.  Call your gastroenterologist if you have not heard about the biopsies in 3 weeks.  Our staff will call the home number listed on your records the next business day following your procedure to check on you and address any questions or concerns that you may have at that time regarding the information given to you following your procedure. This is a courtesy call and so if there is no answer at the home number and we have not heard from you through the emergency physician on call, we will assume that you have returned to your regular daily activities without incident.  SIGNATURES/CONFIDENTIALITY: You and/or your care partner have signed paperwork which will be entered into your electronic medical record.  These signatures attest to the fact that that the information above on your After Visit Summary has been reviewed   and is understood.  Full responsibility of the confidentiality of this discharge information lies with you and/or your care-partner. 

## 2014-07-13 ENCOUNTER — Telehealth: Payer: Self-pay

## 2014-07-13 NOTE — Telephone Encounter (Signed)
Left message on answering machine. 

## 2014-09-01 ENCOUNTER — Ambulatory Visit (INDEPENDENT_AMBULATORY_CARE_PROVIDER_SITE_OTHER): Payer: Medicare Other | Admitting: Family Medicine

## 2014-09-01 ENCOUNTER — Encounter: Payer: Self-pay | Admitting: Family Medicine

## 2014-09-01 VITALS — BP 100/70 | Temp 97.8°F | Wt 162.0 lb

## 2014-09-01 DIAGNOSIS — M25511 Pain in right shoulder: Secondary | ICD-10-CM

## 2014-09-01 MED ORDER — MELOXICAM 15 MG PO TABS: 15.0000 mg | ORAL_TABLET | Freq: Every day | ORAL | Status: DC

## 2014-09-01 NOTE — Progress Notes (Signed)
Tana ConchStephen Leeanna Slaby, MD Phone: [phone]  Subjective:   Martin James is a [age] y.o. year old very pleasant male patient who presents with the following:  Right shoulder pain -Fell on ice a week ago. Fell directly on right shoulder. Did not hit head, no LOC. Hit leg but minor pain. Hurts in anterior shoulder. Trouble lifting arm. No medications tried.  ROS- no chest pain, denies arm weakness but cannot due activities due to pain, no swelling over arm.   Past Medical History- Patient Active Problem List   Diagnosis Date Noted  . CAD (coronary artery disease) 02/17/2007    Priority: High  . Hyperlipidemia 02/17/2007    Priority: Medium  . Essential hypertension 02/17/2007    Priority: Medium  . Headache 05/24/2014    Priority: Low  . INSOMNIA, PERSISTENT 03/03/2007    Priority: Low  . Depression 03/03/2007    Priority: Low  . SYNDROME, CARPAL TUNNEL 02/17/2007    Priority: Low  . Fatty liver 02/17/2007    Priority: Low  . Right shoulder pain 09/02/2014   Medications- reviewed and updated Current Outpatient Prescriptions  Medication Sig Dispense Refill  . amLODipine (NORVASC) 2.5 MG tablet Take 1 tablet (2.5 mg total) by mouth daily. 90 tablet 2  . aspirin 81 MG tablet Take 81 mg by mouth daily.      Marland Kitchen. atorvastatin (LIPITOR) 40 MG tablet Take 1 tablet (40 mg total) by mouth daily. 90 tablet 3  . carvedilol (COREG) 12.5 MG tablet Take 1 tablet (12.5 mg total) by mouth 2 (two) times daily with a meal. 180 tablet 3  . ibuprofen (ADVIL,MOTRIN) 200 MG tablet Take 200 mg by mouth every 6 (six) hours as needed for moderate pain.    . isosorbide mononitrate (IMDUR) 60 MG 24 hr tablet Take 1 tablet (60 mg total) by mouth daily. 90 tablet 3  . loratadine (CLARITIN) 10 MG tablet Take 10 mg by mouth daily.     Objective: BP 100/70 mmHg  Temp(Src) 97.8 F (36.6 C)  Wt 162 lb (73.483 kg) Gen: NAD, resting comfortably CV: RRR no murmurs rubs or gallops Lungs: CTAB no crackles, wheeze,  rhonchi  Right Shoulder Pain Inspection reveals no abnormalities, atrophy or asymmetry. Palpation is normal with no tenderness over AC joint. Does have some pain over bicipital groove. Cannot fully abduct arm. Limited 120 degrees with passive motion, with active motion only about 30 degrees. Cannot forward flex arm past 30 degrees either.  Cannot lift arms for empty can testing and pain limites Neer and hawkins.  Yergason's test positive for pain.  Drop arm sign noted as well as painful arc.   Assessment/Plan:  Right shoulder pain Testing concerning for rotator cuff pathology including potential tear given drop arm sign. Exam also concerning for bicipital tendonitis though mechanism would not necessarily lead to this.  Given limited mobility, also concerned for development of frozen shoulder with prolonged immobilization. Had fall and needs x-ray. If negative, likely needs MRI or ultrasound.  We discussed starting with x-ray here vs. Orthopedics and patient opts for ortho referral. [facility] to [facility]. Patient declines pain medication but accepts nsaids (discussed cardiac and renal risk)    Orders Placed This Encounter  Procedures  . Ambulatory referral to Orthopedic Surgery    Referral Priority:  Urgent    Referral Type:  Surgical    Referral Reason:  Specialty Services Required    Requested Specialty:  Orthopedic Surgery    Number of Visits Requested:  1  Meds ordered this encounter  Medications  . meloxicam (MOBIC) 15 MG tablet    Sig: Take 1 tablet (15 mg total) by mouth daily. Take for 10 days then as needed    Dispense:  30 tablet    Refill:  0

## 2014-09-01 NOTE — Patient Instructions (Signed)
Right shoulder pain. We discussed x-ray in office and then follow up vs. Orthopedic referral and decided to go to orthopedics so you can get everything done in one place. Since I cannot lift your arm all the way I am concerned of frozen shoulder which can develop after not moving your arm.   The antiinflammatory mobic does carry some cardiac risk so stop immediately and seek care if any chest pain or shortness of breath.

## 2014-09-02 DIAGNOSIS — M75111 Incomplete rotator cuff tear or rupture of right shoulder, not specified as traumatic: Secondary | ICD-10-CM | POA: Diagnosis not present

## 2014-09-02 DIAGNOSIS — M25511 Pain in right shoulder: Secondary | ICD-10-CM | POA: Insufficient documentation

## 2014-09-02 NOTE — Assessment & Plan Note (Addendum)
Testing concerning for rotator cuff pathology including potential tear given drop arm sign. Exam also concerning for bicipital tendonitis though mechanism would not necessarily lead to this.  Given limited mobility, also concerned for development of frozen shoulder with prolonged immobilization. Had fall and needs x-ray. If negative, likely needs MRI or ultrasound.  We discussed starting with x-ray here vs. Orthopedics and patient opts for ortho referral. [facility] to [facility]. Patient declines pain medication but accepts nsaids (discussed cardiac and renal risk)

## 2014-09-05 DIAGNOSIS — M19011 Primary osteoarthritis, right shoulder: Secondary | ICD-10-CM | POA: Diagnosis not present

## 2014-09-06 DIAGNOSIS — S46011A Strain of muscle(s) and tendon(s) of the rotator cuff of right shoulder, initial encounter: Secondary | ICD-10-CM | POA: Diagnosis not present

## 2014-09-09 ENCOUNTER — Telehealth: Payer: Self-pay | Admitting: Family Medicine

## 2014-09-09 ENCOUNTER — Telehealth: Payer: Self-pay

## 2014-09-09 DIAGNOSIS — Z8679 Personal history of other diseases of the circulatory system: Secondary | ICD-10-CM

## 2014-09-09 NOTE — Telephone Encounter (Signed)
FYI to Dr. hunter  Pt scheduled for 09/19/2014@10adGjgRzpu$ :30am - -spoke with  States he wants to put this off due to  rotator cuff surgery  [facility]  [facility]iologist  Address: [address], [place], [place] [address]  [phone]

## 2014-09-09 NOTE — Telephone Encounter (Signed)
Ardine BjorkKeba this is for his clearance. He would need this before the surgery.

## 2014-09-09 NOTE — Telephone Encounter (Signed)
Referral entered  

## 2014-09-09 NOTE — Telephone Encounter (Signed)
Called and left message on pt VM informing him he needs to keep this appt with cardiology in order to be cleared for surgery.

## 2014-09-12 ENCOUNTER — Other Ambulatory Visit: Payer: Self-pay

## 2014-09-12 MED ORDER — AMLODIPINE BESYLATE 2.5 MG PO TABS: 2.5000 mg | ORAL_TABLET | Freq: Every day | ORAL | Status: DC

## 2014-09-12 NOTE — Telephone Encounter (Signed)
Rx request for amlodipine bestylate 2.5 mg tablet-Take 1 tablet by mouth once daily #90 Pt is out of medication.   Pharm:  [facility] [place].

## 2014-09-14 ENCOUNTER — Encounter: Payer: Self-pay | Admitting: Cardiovascular Disease

## 2014-09-14 ENCOUNTER — Ambulatory Visit (INDEPENDENT_AMBULATORY_CARE_PROVIDER_SITE_OTHER): Payer: Medicare Other | Admitting: Cardiovascular Disease

## 2014-09-14 VITALS — BP 128/68 | HR 67 | Ht 69.0 in | Wt 158.0 lb

## 2014-09-14 DIAGNOSIS — Z955 Presence of coronary angioplasty implant and graft: Secondary | ICD-10-CM | POA: Diagnosis not present

## 2014-09-14 DIAGNOSIS — I2510 Atherosclerotic heart disease of native coronary artery without angina pectoris: Secondary | ICD-10-CM | POA: Diagnosis not present

## 2014-09-14 DIAGNOSIS — Z01818 Encounter for other preprocedural examination: Secondary | ICD-10-CM | POA: Diagnosis not present

## 2014-09-14 DIAGNOSIS — E785 Hyperlipidemia, unspecified: Secondary | ICD-10-CM | POA: Diagnosis not present

## 2014-09-14 DIAGNOSIS — I10 Essential (primary) hypertension: Secondary | ICD-10-CM | POA: Diagnosis not present

## 2014-09-14 NOTE — Progress Notes (Signed)
Patient ID: Deidre AlaClyde H Bellotti, male   DOB: [birth year], 74 y.o.   MRN: [id number]       CARDIOLOGY CONSULT NOTE  Patient ID: Deidre AlaClyde H Glidden MRN: [id number] DOB/AGE: [birth year] 74 y.o.  Admit date: (Not on file) Primary Physician Tana ConchHUNTER, STEPHEN, MD  Reason for Consultation: CAD, preop clearance  HPI: The patient is a 74 year old male with a history of coronary artery disease and reportedly had 4 stents placed, 2 roughly 20 years ago and another 2 in 2005. He also has a history of hypertension and hyperlipidemia. He is scheduled to undergo rotator cuff surgery and is referred for preoperative risk stratification. He has been walking 8 miles daily for the past 14 years, and denies chest pain, shortness of breath, dizziness, and syncope. He walked, slipped on ice, and fell and injured his right shoulder. Prior to stent placement, he had chest pain radiating into the throat. He has had palpitations since childhood and these are unchanged. ECG today shows normal sinus rhythm with a diffuse nonspecific T wave abnormality.     No Known Allergies  Current Outpatient Prescriptions  Medication Sig Dispense Refill  . amLODipine (NORVASC) 2.5 MG tablet Take 1 tablet (2.5 mg total) by mouth daily. 90 tablet 2  . aspirin 81 MG tablet Take 81 mg by mouth daily.      Marland Kitchen. atorvastatin (LIPITOR) 40 MG tablet Take 1 tablet (40 mg total) by mouth daily. 90 tablet 3  . carvedilol (COREG) 12.5 MG tablet Take 1 tablet (12.5 mg total) by mouth 2 (two) times daily with a meal. 180 tablet 3  . ibuprofen (ADVIL,MOTRIN) 200 MG tablet Take 200 mg by mouth every 6 (six) hours as needed for moderate pain.    . isosorbide mononitrate (IMDUR) 60 MG 24 hr tablet Take 1 tablet (60 mg total) by mouth daily. 90 tablet 3  . loratadine (CLARITIN) 10 MG tablet Take 10 mg by mouth daily as needed.      No current facility-administered medications for this visit.    Past Medical History  Diagnosis Date  . CAD (coronary artery  disease)   . Hyperlipidemia   . Hypertension   . Depression   . Insomnia   . Allergy   . Sinus congestion   . Cataract     Past Surgical History  Procedure Laterality Date  . Appendectomy    . Undescended testicle surgery    . Coronary angioplasty      stents x4  . Cataract extraction w/phaco Right 07/08/2013    Procedure: CATARACT EXTRACTION PHACO AND INTRAOCULAR LENS PLACEMENT (IOC);  Surgeon: Gemma PayorKerry Hunt, MD;  Location: [facility];  Service: Ophthalmology;  Laterality: Right;  CDE 26.34    History   Social History  . Marital Status: Married    Spouse Name: N/A  . Number of Children: N/A  . Years of Education: N/A   Occupational History  . retired    Social History Main Topics  . Smoking status: Former Smoker -- 0.25 packs/day for 30 years    Types: Cigarettes    Quit date: 08/05/1990  . Smokeless tobacco: Never Used  . Alcohol Use: 1.2 oz/week    1 Glasses of wine, 1 Cans of beer per week     Comment: occassional  . Drug Use: No  . Sexual Activity: Yes    Birth Control/ Protection: None   Other Topics Concern  . Not on file   Social History Narrative   Married 1964. 2 kids-son, daughter.  5 grandkids.       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      Hobbies: walk 8 miles a day, neighborhood handyman     No family history of premature CAD in 1st degree relatives.  Prior to Admission medications   Medication Sig Start Date End Date Taking? Authorizing Provider  amLODipine (NORVASC) 2.5 MG tablet Take 1 tablet (2.5 mg total) by mouth daily. 09/12/14  Yes [name], MD  aspirin 81 MG tablet Take 81 mg by mouth daily.     Yes Historical Provider, MD  atorvastatin (LIPITOR) 40 MG tablet Take 1 tablet (40 mg total) by mouth daily. 05/24/14  Yes [name], MD  carvedilol (COREG) 12.5 MG tablet Take 1 tablet (12.5 mg total) by mouth 2 (two) times daily with a meal. 05/24/14  Yes [name], MD  ibuprofen (ADVIL,MOTRIN) 200 MG tablet Take 200 mg by mouth every  6 (six) hours as needed for moderate pain.   Yes Historical Provider, MD  isosorbide mononitrate (IMDUR) 60 MG 24 hr tablet Take 1 tablet (60 mg total) by mouth daily. 12/29/13  Yes Stacie Glaze, MD  loratadine (CLARITIN) 10 MG tablet Take 10 mg by mouth daily as needed.    Yes Historical Provider, MD     Review of systems complete and found to be negative unless listed above in HPI     Physical exam Blood pressure 128/68, pulse 67, height  (1.753 m), weight 158 lb (71.668 kg), SpO2 98 %. General: NAD Neck: No JVD, no thyromegaly or thyroid nodule.  Lungs: Clear to auscultation bilaterally with normal respiratory effort. CV: Nondisplaced PMI. Regular rate and rhythm, normal S1/S2, no S3/S4, no murmur.  No peripheral edema.  No carotid bruit.  Normal pedal pulses.  Abdomen: Soft, nontender, no hepatosplenomegaly, no distention.  Skin: Intact without lesions or rashes.  Neurologic: Alert and oriented x 3.  Psych: Normal affect. Extremities: No clubbing or cyanosis.  HEENT: Normal.   ECG: Most recent ECG reviewed.  Labs:   Lab Results  Component Value Date   WBC 5.9 06/11/2012   HGB 13.9 06/28/2013   HCT 40.0 06/28/2013   MCV 95.2 06/11/2012   PLT 224.0 06/11/2012   No results for input(s): NA, K, CL, CO2, BUN, CREATININE, CALCIUM, PROT, BILITOT, ALKPHOS, ALT, AST, GLUCOSE in the last 168 hours.  Invalid input(s): LABALBU No results found for: CKTOTAL, CKMB, CKMBINDEX, TROPONINI  Lab Results  Component Value Date   CHOL 151 08/16/2013   CHOL 145 12/14/2012   CHOL 150 06/11/2012   Lab Results  Component Value Date   HDL 37.00* 08/16/2013   HDL 38.30* 12/14/2012   HDL 36.90* 06/11/2012   Lab Results  Component Value Date   LDLCALC 98 08/16/2013   LDLCALC 91 12/14/2012   LDLCALC 98 06/11/2012   Lab Results  Component Value Date   TRIG 82.0 08/16/2013   TRIG 79.0 12/14/2012   TRIG 76.0 06/11/2012   Lab Results  Component Value Date   CHOLHDL 4  08/16/2013   CHOLHDL 4 12/14/2012   CHOLHDL 4 06/11/2012   Lab Results  Component Value Date   LDLDIRECT 99.3 06/28/2009         Studies: No results found.  ASSESSMENT AND PLAN:  1. Preoperative risk stratification: Given the fact that he has been able to walk 8 miles daily for the past 14 years and is entirely asymptomatic, this would portend a very favorable prognosis with a low risk of  major adverse cardiac events in the perioperative period. I recommend continuing ASA, Lipitor, Imdur, and Coreg. I will obtain an echocardiogram to obtain an objective assessment of left ventricular systolic function and regional wall motion. 2. CAD with history of multiple stents: While ECG is abnormal, he is symptomatically stable and has been for the past 11 years.  I recommend continuing ASA, Lipitor, Imdur, and Coreg. I will obtain an echocardiogram to obtain an objective assessment of left ventricular systolic function and regional wall motion. 3. Essential HTN: Well controlled on Coreg. No changes. 4. Hyperlipidemia: Continue Lipitor 40 mg.  Dispo: f/u 1 year.  Signed: Prentice Docker, M.D., F.A.C.C.  09/14/2014, 10:32 AM

## 2014-09-14 NOTE — Patient Instructions (Signed)
Your physician has requested that you have an echocardiogram. Echocardiography is a painless test that uses sound waves to create images of your heart. It provides your doctor with information about the size and shape of your heart and how well your heart's chambers and valves are working. This procedure takes approximately one hour. There are no restrictions for this procedure. Office will contact with results via phone or letter.   Continue all current medications. Your physician wants you to follow up in:  1 year.  You will receive a reminder letter in the mail one-two months in advance.  If you don't receive a letter, please call our office to schedule the follow up appointment    

## 2014-09-14 NOTE — Addendum Note (Signed)
Addended by: Lesle ChrisHILL, Shaunita Seney G on: 09/14/2014 10:50 AM   Modules accepted: Orders, Level of Service

## 2014-09-15 ENCOUNTER — Other Ambulatory Visit: Payer: Self-pay

## 2014-09-15 ENCOUNTER — Other Ambulatory Visit (INDEPENDENT_AMBULATORY_CARE_PROVIDER_SITE_OTHER): Payer: Medicare Other

## 2014-09-15 DIAGNOSIS — Z01818 Encounter for other preprocedural examination: Secondary | ICD-10-CM | POA: Diagnosis not present

## 2014-09-15 DIAGNOSIS — I2510 Atherosclerotic heart disease of native coronary artery without angina pectoris: Secondary | ICD-10-CM

## 2014-09-15 DIAGNOSIS — Z955 Presence of coronary angioplasty implant and graft: Secondary | ICD-10-CM

## 2014-09-19 ENCOUNTER — Ambulatory Visit: Payer: Medicare Other | Admitting: Cardiovascular Disease

## 2014-09-20 ENCOUNTER — Telehealth: Payer: Self-pay | Admitting: *Deleted

## 2014-09-20 NOTE — Telephone Encounter (Signed)
Notes Recorded by Lesle ChrisAngela G Harley Mccartney, LPN on 8/29/56212/16/2016 at 10:07 AM Patient notified. Note forwarded to Dr. Thurston HoleWainer.

## 2014-09-20 NOTE — Telephone Encounter (Signed)
-----   Message from Eustace MooreLydia M Anderson, LPN sent at 4/54/09812/07/2015  2:13 PM EST ----- Regarding: Purvis SheffieldKoneswaran patient   ----- Message -----    From: Laqueta LindenSuresh A Koneswaran, MD    Sent: 09/15/2014   4:38 PM      To: Eustace MooreLydia M Anderson, LPN  EF low normal to mildly reduced, EF 45-50%. Can safely proceed with surgery.

## 2014-09-20 NOTE — Telephone Encounter (Signed)
Notes Recorded by Lesle ChrisAngela G Zimri Brennen, LPN on 6/21/30862/16/2016 at 10:00 AM Left message to return call.

## 2014-09-26 ENCOUNTER — Encounter: Payer: Self-pay | Admitting: Family Medicine

## 2014-09-26 ENCOUNTER — Ambulatory Visit (INDEPENDENT_AMBULATORY_CARE_PROVIDER_SITE_OTHER): Payer: Medicare Other | Admitting: Family Medicine

## 2014-09-26 VITALS — BP 98/64 | Temp 97.7°F | Wt 157.0 lb

## 2014-09-26 DIAGNOSIS — I10 Essential (primary) hypertension: Secondary | ICD-10-CM | POA: Diagnosis not present

## 2014-09-26 DIAGNOSIS — I2510 Atherosclerotic heart disease of native coronary artery without angina pectoris: Secondary | ICD-10-CM

## 2014-09-26 DIAGNOSIS — E785 Hyperlipidemia, unspecified: Secondary | ICD-10-CM | POA: Diagnosis not present

## 2014-09-26 LAB — COMPREHENSIVE METABOLIC PANEL
ALT: 21 U/L (ref 0–53)
AST: 24 U/L (ref 0–37)
Albumin: 4 g/dL (ref 3.5–5.2)
Alkaline Phosphatase: 51 U/L (ref 39–117)
BUN: 19 mg/dL (ref 6–23)
CALCIUM: 9.6 mg/dL (ref 8.4–10.5)
CO2: 25 meq/L (ref 19–32)
CREATININE: 0.98 mg/dL (ref 0.40–1.50)
Chloride: 105 mEq/L (ref 96–112)
GFR: 79.29 mL/min (ref >60.00)
GLUCOSE: 102 mg/dL — AB (ref 70–99)
POTASSIUM: 4.6 meq/L (ref 3.5–5.1)
SODIUM: 139 meq/L (ref 135–145)
Total Bilirubin: 0.7 mg/dL (ref 0.2–1.2)
Total Protein: 7 g/dL (ref 6.0–8.3)

## 2014-09-26 LAB — LIPID PANEL
CHOL/HDL RATIO: 3
CHOLESTEROL: 119 mg/dL (ref 0–200)
HDL: 39.6 mg/dL (ref >39.00)
LDL Cholesterol: 69 mg/dL (ref 0–99)
NonHDL: 79.4
Triglycerides: 50 mg/dL (ref 0.0–149.0)
VLDL: 10 mg/dL (ref 0.0–40.0)

## 2014-09-26 LAB — CBC
HCT: 43.2 % (ref 39.0–52.0)
HEMOGLOBIN: 14.7 g/dL (ref 13.0–17.0)
MCHC: 34.1 g/dL (ref 30.0–36.0)
MCV: 93.6 fl (ref 78.0–100.0)
PLATELETS: 218 10*3/uL (ref 150.0–400.0)
RBC: 4.62 Mil/uL (ref 4.22–5.81)
RDW: 13.9 % (ref 11.5–15.5)
WBC: 5.2 10*3/uL (ref 4.0–10.5)

## 2014-09-26 LAB — TSH: TSH: 0.83 u[IU]/mL (ref 0.35–4.50)

## 2014-09-26 NOTE — Assessment & Plan Note (Signed)
Controlled on atorvastatin 40mg  with  Lab Results  Component Value Date   LDLCALC 69 09/26/2014

## 2014-09-26 NOTE — Patient Instructions (Signed)
Get labs today results through mychart  Sorry for the delay in the surgery. We tried to get you set up today by faxing your final note from cardiology.   Call Sherri to schedule when you get home. Make sure to have her overhead paged per her request.

## 2014-09-26 NOTE — Assessment & Plan Note (Signed)
Asymptomatic. Continue medical therapy with BP control, lipid management, imdur. I helped facilitate today the cardiac clearance which had already been granted by cardiology by calling [facility] and faxing notes to them from cardiology off last echo report.

## 2014-09-26 NOTE — Assessment & Plan Note (Signed)
controlled on Amlodipine 2.5mg , carvedilol 12.5 mg BID, imdur 60mg . No changes.

## 2014-09-26 NOTE — Progress Notes (Signed)
Martin ConchStephen Brenner Visconti, MD Phone: [phone]  Subjective:   Martin James is a 74 y.o. year old very pleasant male patient who presents with the following:  Hyperlipidemia-good control previously on crestor 10mg . Changed to atorvastatin due to cost with suspected good control. Regular exercise: walks regularly  CAD with last stents placed 2005-asymptomatic and stable Patient was supposed to have orthopedic surgery for torn rotator cuff on right shoulder with  Dr. Thurston HoleWainer. This has been delayed while seeking cardiac clearance. He was cleared by cardiology after an echocardiogram but this has not been communicated to [facility] yet and he asks for assist with this.   Hypertension-controlled on Amlodipine 2.5mg , carvedilol 12.5 mg BID, imdur 60mg  BP Readings from Last 3 Encounters:  09/26/14 98/64  09/14/14 128/68  09/01/14 100/70   ROS- no chest pain or shortness of breath. No myalgias. No dyspnea on exertion with his walking. No LE edema. No dizziness at current BP.   Past Medical History- Patient Active Problem List   Diagnosis Date Noted  . CAD (coronary artery disease) 02/17/2007    Priority: High  . Hyperlipidemia 02/17/2007    Priority: Medium  . Essential hypertension 02/17/2007    Priority: Medium  . Headache 05/24/2014    Priority: Low  . INSOMNIA, PERSISTENT 03/03/2007    Priority: Low  . Depression 03/03/2007    Priority: Low  . SYNDROME, CARPAL TUNNEL 02/17/2007    Priority: Low  . Fatty liver 02/17/2007    Priority: Low  . Right shoulder pain 09/02/2014   Medications- reviewed and updated Current Outpatient Prescriptions  Medication Sig Dispense Refill  . amLODipine (NORVASC) 2.5 MG tablet Take 1 tablet (2.5 mg total) by mouth daily. 90 tablet 2  . aspirin 81 MG tablet Take 81 mg by mouth daily.      Marland Kitchen. atorvastatin (LIPITOR) 40 MG tablet Take 1 tablet (40 mg total) by mouth daily. 90 tablet 3  . carvedilol (COREG) 12.5 MG tablet Take 1 tablet (12.5 mg total) by  mouth 2 (two) times daily with a meal. 180 tablet 3  . ibuprofen (ADVIL,MOTRIN) 200 MG tablet Take 200 mg by mouth every 6 (six) hours as needed for moderate pain.    . isosorbide mononitrate (IMDUR) 60 MG 24 hr tablet Take 1 tablet (60 mg total) by mouth daily. 90 tablet 3  . loratadine (CLARITIN) 10 MG tablet Take 10 mg by mouth daily as needed.      No current facility-administered medications for this visit.    Objective: BP 98/64 mmHg  Temp(Src) 97.7 F (36.5 C)  Wt 157 lb (71.215 kg) Gen: NAD, resting comfortably CV: RRR no murmurs rubs or gallops Lungs: CTAB no crackles, wheeze, rhonchi Ext: no edema Skin: warm, dry, no rash MSK: holds right arm close, good grip strength on R   Assessment/Plan:  CAD (coronary artery disease) Asymptomatic. Continue medical therapy with BP control, lipid management, imdur. I helped facilitate today the cardiac clearance which had already been granted by cardiology by calling [facility] and faxing notes to them from cardiology off last echo report.    Hyperlipidemia Controlled on atorvastatin 40mg  with  Lab Results  Component Value Date   LDLCALC 69 09/26/2014      Essential hypertension controlled on Amlodipine 2.5mg , carvedilol 12.5 mg BID, imdur 60mg . No changes.     Return precautions advised. Plan for 6 months or prn.   Largely normal labs except slight increase risk DM with fasting CBG 102.  Results for orders placed or  performed in visit on 09/26/14 (from the past 24 hour(s))  CBC     Status: None   Collection Time: 09/26/14  9:29 AM  Result Value Ref Range   WBC 5.2 4.0 - 10.5 K/uL   RBC 4.62 4.22 - 5.81 Mil/uL   Platelets 218.0 150.0 - 400.0 K/uL   Hemoglobin 14.7 13.0 - 17.0 g/dL   HCT 96.0 45.4 - 09.8 %   MCV 93.6 78.0 - 100.0 fl   MCHC 34.1 30.0 - 36.0 g/dL   RDW 11.9 14.7 - 82.9 %  Comprehensive metabolic panel     Status: Abnormal   Collection Time: 09/26/14  9:29 AM  Result Value Ref Range   Sodium 139  135 - 145 mEq/L   Potassium 4.6 3.5 - 5.1 mEq/L   Chloride 105 96 - 112 mEq/L   CO2 25 19 - 32 mEq/L   Glucose, Bld 102 (H) 70 - 99 mg/dL   BUN 19 6 - 23 mg/dL   Creatinine, Ser 5.62 0.40 - 1.50 mg/dL   Total Bilirubin 0.7 0.2 - 1.2 mg/dL   Alkaline Phosphatase 51 39 - 117 U/L   AST 24 0 - 37 U/L   ALT 21 0 - 53 U/L   Total Protein 7.0 6.0 - 8.3 g/dL   Albumin 4.0 3.5 - 5.2 g/dL   Calcium 9.6 8.4 - 13.0 mg/dL   GFR 86.57 >84.69 mL/min  Lipid panel     Status: None   Collection Time: 09/26/14  9:29 AM  Result Value Ref Range   Cholesterol 119 0 - 200 mg/dL   Triglycerides 62.9 0.0 - 149.0 mg/dL   HDL 52.84 >13.24 mg/dL   VLDL 40.1 0.0 - 02.7 mg/dL   LDL Cholesterol 69 0 - 99 mg/dL   Total CHOL/HDL Ratio 3    NonHDL 79.40   TSH     Status: None   Collection Time: 09/26/14  9:29 AM  Result Value Ref Range   TSH 0.83 0.35 - 4.50 uIU/mL

## 2014-10-07 DIAGNOSIS — Y998 Other external cause status: Secondary | ICD-10-CM | POA: Diagnosis not present

## 2014-10-07 DIAGNOSIS — S46201A Unspecified injury of muscle, fascia and tendon of other parts of biceps, right arm, initial encounter: Secondary | ICD-10-CM | POA: Diagnosis not present

## 2014-10-07 DIAGNOSIS — S46011A Strain of muscle(s) and tendon(s) of the rotator cuff of right shoulder, initial encounter: Secondary | ICD-10-CM | POA: Diagnosis not present

## 2014-10-07 DIAGNOSIS — Y929 Unspecified place or not applicable: Secondary | ICD-10-CM | POA: Diagnosis not present

## 2014-10-07 DIAGNOSIS — M7551 Bursitis of right shoulder: Secondary | ICD-10-CM | POA: Diagnosis not present

## 2014-10-07 DIAGNOSIS — M7521 Bicipital tendinitis, right shoulder: Secondary | ICD-10-CM | POA: Diagnosis not present

## 2014-10-07 DIAGNOSIS — S43491A Other sprain of right shoulder joint, initial encounter: Secondary | ICD-10-CM | POA: Diagnosis not present

## 2014-10-07 DIAGNOSIS — M7541 Impingement syndrome of right shoulder: Secondary | ICD-10-CM | POA: Diagnosis not present

## 2014-10-07 DIAGNOSIS — G8918 Other acute postprocedural pain: Secondary | ICD-10-CM | POA: Diagnosis not present

## 2014-10-07 DIAGNOSIS — M75121 Complete rotator cuff tear or rupture of right shoulder, not specified as traumatic: Secondary | ICD-10-CM | POA: Diagnosis not present

## 2014-10-10 DIAGNOSIS — W000XXD Fall on same level due to ice and snow, subsequent encounter: Secondary | ICD-10-CM | POA: Diagnosis not present

## 2014-10-10 DIAGNOSIS — S43421D Sprain of right rotator cuff capsule, subsequent encounter: Secondary | ICD-10-CM | POA: Diagnosis not present

## 2014-10-10 DIAGNOSIS — M6281 Muscle weakness (generalized): Secondary | ICD-10-CM | POA: Diagnosis not present

## 2014-10-10 DIAGNOSIS — M25511 Pain in right shoulder: Secondary | ICD-10-CM | POA: Diagnosis not present

## 2014-10-11 DIAGNOSIS — Z4789 Encounter for other orthopedic aftercare: Secondary | ICD-10-CM | POA: Diagnosis not present

## 2014-10-13 DIAGNOSIS — M6281 Muscle weakness (generalized): Secondary | ICD-10-CM | POA: Diagnosis not present

## 2014-10-13 DIAGNOSIS — M25511 Pain in right shoulder: Secondary | ICD-10-CM | POA: Diagnosis not present

## 2014-10-13 DIAGNOSIS — W000XXD Fall on same level due to ice and snow, subsequent encounter: Secondary | ICD-10-CM | POA: Diagnosis not present

## 2014-10-13 DIAGNOSIS — S43421D Sprain of right rotator cuff capsule, subsequent encounter: Secondary | ICD-10-CM | POA: Diagnosis not present

## 2014-10-18 DIAGNOSIS — W000XXD Fall on same level due to ice and snow, subsequent encounter: Secondary | ICD-10-CM | POA: Diagnosis not present

## 2014-10-18 DIAGNOSIS — M25511 Pain in right shoulder: Secondary | ICD-10-CM | POA: Diagnosis not present

## 2014-10-18 DIAGNOSIS — M6281 Muscle weakness (generalized): Secondary | ICD-10-CM | POA: Diagnosis not present

## 2014-10-18 DIAGNOSIS — S43421D Sprain of right rotator cuff capsule, subsequent encounter: Secondary | ICD-10-CM | POA: Diagnosis not present

## 2014-10-24 DIAGNOSIS — M6281 Muscle weakness (generalized): Secondary | ICD-10-CM | POA: Diagnosis not present

## 2014-10-24 DIAGNOSIS — S43421D Sprain of right rotator cuff capsule, subsequent encounter: Secondary | ICD-10-CM | POA: Diagnosis not present

## 2014-10-24 DIAGNOSIS — M25511 Pain in right shoulder: Secondary | ICD-10-CM | POA: Diagnosis not present

## 2014-10-24 DIAGNOSIS — W000XXD Fall on same level due to ice and snow, subsequent encounter: Secondary | ICD-10-CM | POA: Diagnosis not present

## 2014-10-27 DIAGNOSIS — S43421D Sprain of right rotator cuff capsule, subsequent encounter: Secondary | ICD-10-CM | POA: Diagnosis not present

## 2014-10-27 DIAGNOSIS — M6281 Muscle weakness (generalized): Secondary | ICD-10-CM | POA: Diagnosis not present

## 2014-10-27 DIAGNOSIS — M25511 Pain in right shoulder: Secondary | ICD-10-CM | POA: Diagnosis not present

## 2014-10-27 DIAGNOSIS — W000XXD Fall on same level due to ice and snow, subsequent encounter: Secondary | ICD-10-CM | POA: Diagnosis not present

## 2014-11-01 DIAGNOSIS — Z4789 Encounter for other orthopedic aftercare: Secondary | ICD-10-CM | POA: Diagnosis not present

## 2014-11-04 DIAGNOSIS — M6281 Muscle weakness (generalized): Secondary | ICD-10-CM | POA: Diagnosis not present

## 2014-11-04 DIAGNOSIS — W000XXD Fall on same level due to ice and snow, subsequent encounter: Secondary | ICD-10-CM | POA: Diagnosis not present

## 2014-11-04 DIAGNOSIS — M25511 Pain in right shoulder: Secondary | ICD-10-CM | POA: Diagnosis not present

## 2014-11-04 DIAGNOSIS — S43421D Sprain of right rotator cuff capsule, subsequent encounter: Secondary | ICD-10-CM | POA: Diagnosis not present

## 2014-11-09 DIAGNOSIS — M6281 Muscle weakness (generalized): Secondary | ICD-10-CM | POA: Diagnosis not present

## 2014-11-09 DIAGNOSIS — W000XXD Fall on same level due to ice and snow, subsequent encounter: Secondary | ICD-10-CM | POA: Diagnosis not present

## 2014-11-09 DIAGNOSIS — M25511 Pain in right shoulder: Secondary | ICD-10-CM | POA: Diagnosis not present

## 2014-11-09 DIAGNOSIS — S43421D Sprain of right rotator cuff capsule, subsequent encounter: Secondary | ICD-10-CM | POA: Diagnosis not present

## 2014-11-15 DIAGNOSIS — Z4789 Encounter for other orthopedic aftercare: Secondary | ICD-10-CM | POA: Diagnosis not present

## 2014-11-16 DIAGNOSIS — S43421D Sprain of right rotator cuff capsule, subsequent encounter: Secondary | ICD-10-CM | POA: Diagnosis not present

## 2014-11-16 DIAGNOSIS — M6281 Muscle weakness (generalized): Secondary | ICD-10-CM | POA: Diagnosis not present

## 2014-11-16 DIAGNOSIS — M25511 Pain in right shoulder: Secondary | ICD-10-CM | POA: Diagnosis not present

## 2014-11-16 DIAGNOSIS — W000XXD Fall on same level due to ice and snow, subsequent encounter: Secondary | ICD-10-CM | POA: Diagnosis not present

## 2014-11-21 DIAGNOSIS — M25511 Pain in right shoulder: Secondary | ICD-10-CM | POA: Diagnosis not present

## 2014-11-21 DIAGNOSIS — M6281 Muscle weakness (generalized): Secondary | ICD-10-CM | POA: Diagnosis not present

## 2014-11-21 DIAGNOSIS — S43421D Sprain of right rotator cuff capsule, subsequent encounter: Secondary | ICD-10-CM | POA: Diagnosis not present

## 2014-11-21 DIAGNOSIS — W000XXD Fall on same level due to ice and snow, subsequent encounter: Secondary | ICD-10-CM | POA: Diagnosis not present

## 2014-11-25 DIAGNOSIS — M25511 Pain in right shoulder: Secondary | ICD-10-CM | POA: Diagnosis not present

## 2014-11-25 DIAGNOSIS — S43421D Sprain of right rotator cuff capsule, subsequent encounter: Secondary | ICD-10-CM | POA: Diagnosis not present

## 2014-11-25 DIAGNOSIS — M6281 Muscle weakness (generalized): Secondary | ICD-10-CM | POA: Diagnosis not present

## 2014-11-25 DIAGNOSIS — W000XXD Fall on same level due to ice and snow, subsequent encounter: Secondary | ICD-10-CM | POA: Diagnosis not present

## 2014-11-28 DIAGNOSIS — M25511 Pain in right shoulder: Secondary | ICD-10-CM | POA: Diagnosis not present

## 2014-11-28 DIAGNOSIS — M6281 Muscle weakness (generalized): Secondary | ICD-10-CM | POA: Diagnosis not present

## 2014-11-28 DIAGNOSIS — W000XXD Fall on same level due to ice and snow, subsequent encounter: Secondary | ICD-10-CM | POA: Diagnosis not present

## 2014-11-28 DIAGNOSIS — S43421D Sprain of right rotator cuff capsule, subsequent encounter: Secondary | ICD-10-CM | POA: Diagnosis not present

## 2014-12-02 DIAGNOSIS — M25511 Pain in right shoulder: Secondary | ICD-10-CM | POA: Diagnosis not present

## 2014-12-02 DIAGNOSIS — W000XXD Fall on same level due to ice and snow, subsequent encounter: Secondary | ICD-10-CM | POA: Diagnosis not present

## 2014-12-02 DIAGNOSIS — S43421D Sprain of right rotator cuff capsule, subsequent encounter: Secondary | ICD-10-CM | POA: Diagnosis not present

## 2014-12-02 DIAGNOSIS — M6281 Muscle weakness (generalized): Secondary | ICD-10-CM | POA: Diagnosis not present

## 2014-12-05 DIAGNOSIS — W000XXD Fall on same level due to ice and snow, subsequent encounter: Secondary | ICD-10-CM | POA: Diagnosis not present

## 2014-12-05 DIAGNOSIS — S43421D Sprain of right rotator cuff capsule, subsequent encounter: Secondary | ICD-10-CM | POA: Diagnosis not present

## 2014-12-05 DIAGNOSIS — M25511 Pain in right shoulder: Secondary | ICD-10-CM | POA: Diagnosis not present

## 2014-12-05 DIAGNOSIS — M6281 Muscle weakness (generalized): Secondary | ICD-10-CM | POA: Diagnosis not present

## 2014-12-09 DIAGNOSIS — S43421D Sprain of right rotator cuff capsule, subsequent encounter: Secondary | ICD-10-CM | POA: Diagnosis not present

## 2014-12-09 DIAGNOSIS — M25511 Pain in right shoulder: Secondary | ICD-10-CM | POA: Diagnosis not present

## 2014-12-09 DIAGNOSIS — W000XXD Fall on same level due to ice and snow, subsequent encounter: Secondary | ICD-10-CM | POA: Diagnosis not present

## 2014-12-09 DIAGNOSIS — M6281 Muscle weakness (generalized): Secondary | ICD-10-CM | POA: Diagnosis not present

## 2014-12-12 DIAGNOSIS — S43421D Sprain of right rotator cuff capsule, subsequent encounter: Secondary | ICD-10-CM | POA: Diagnosis not present

## 2014-12-12 DIAGNOSIS — M6281 Muscle weakness (generalized): Secondary | ICD-10-CM | POA: Diagnosis not present

## 2014-12-12 DIAGNOSIS — M25511 Pain in right shoulder: Secondary | ICD-10-CM | POA: Diagnosis not present

## 2014-12-12 DIAGNOSIS — W000XXD Fall on same level due to ice and snow, subsequent encounter: Secondary | ICD-10-CM | POA: Diagnosis not present

## 2014-12-13 DIAGNOSIS — Z4789 Encounter for other orthopedic aftercare: Secondary | ICD-10-CM | POA: Diagnosis not present

## 2014-12-16 DIAGNOSIS — M25511 Pain in right shoulder: Secondary | ICD-10-CM | POA: Diagnosis not present

## 2014-12-16 DIAGNOSIS — M6281 Muscle weakness (generalized): Secondary | ICD-10-CM | POA: Diagnosis not present

## 2014-12-16 DIAGNOSIS — S43421D Sprain of right rotator cuff capsule, subsequent encounter: Secondary | ICD-10-CM | POA: Diagnosis not present

## 2014-12-16 DIAGNOSIS — W000XXD Fall on same level due to ice and snow, subsequent encounter: Secondary | ICD-10-CM | POA: Diagnosis not present

## 2014-12-27 ENCOUNTER — Other Ambulatory Visit: Payer: Self-pay | Admitting: Family Medicine

## 2014-12-27 DIAGNOSIS — I10 Essential (primary) hypertension: Secondary | ICD-10-CM

## 2014-12-27 MED ORDER — ISOSORBIDE MONONITRATE ER 60 MG PO TB24: 60.0000 mg | ORAL_TABLET | Freq: Every day | ORAL | Status: DC

## 2014-12-27 NOTE — Telephone Encounter (Signed)
Sent to the pharmacy by e-scribe. 

## 2015-01-10 DIAGNOSIS — Z4789 Encounter for other orthopedic aftercare: Secondary | ICD-10-CM | POA: Diagnosis not present

## 2015-01-30 ENCOUNTER — Other Ambulatory Visit: Payer: Self-pay

## 2015-03-24 ENCOUNTER — Other Ambulatory Visit: Payer: Self-pay | Admitting: Family Medicine

## 2015-03-27 ENCOUNTER — Ambulatory Visit: Payer: Medicare Other | Admitting: Family Medicine

## 2015-03-31 ENCOUNTER — Ambulatory Visit: Payer: Medicare Other | Admitting: Family Medicine

## 2015-04-05 ENCOUNTER — Ambulatory Visit (INDEPENDENT_AMBULATORY_CARE_PROVIDER_SITE_OTHER): Payer: Medicare Other | Admitting: Family Medicine

## 2015-04-05 ENCOUNTER — Encounter: Payer: Self-pay | Admitting: Family Medicine

## 2015-04-05 VITALS — BP 100/74 | HR 69 | Wt 154.0 lb

## 2015-04-05 DIAGNOSIS — B9689 Other specified bacterial agents as the cause of diseases classified elsewhere: Secondary | ICD-10-CM

## 2015-04-05 DIAGNOSIS — I10 Essential (primary) hypertension: Secondary | ICD-10-CM

## 2015-04-05 DIAGNOSIS — E785 Hyperlipidemia, unspecified: Secondary | ICD-10-CM

## 2015-04-05 DIAGNOSIS — I2510 Atherosclerotic heart disease of native coronary artery without angina pectoris: Secondary | ICD-10-CM

## 2015-04-05 DIAGNOSIS — J329 Chronic sinusitis, unspecified: Secondary | ICD-10-CM | POA: Diagnosis not present

## 2015-04-05 DIAGNOSIS — A499 Bacterial infection, unspecified: Secondary | ICD-10-CM

## 2015-04-05 MED ORDER — AZITHROMYCIN 250 MG PO TABS: ORAL_TABLET | ORAL | Status: DC

## 2015-04-05 NOTE — Assessment & Plan Note (Addendum)
S: asymptomatic, controlled. Walked 70 miles last week A/P:Continue current meds:  Aspirin, BP control with carvedilol and amlodipine, lipid control with atorvastatin, antianginal with imdur

## 2015-04-05 NOTE — Assessment & Plan Note (Signed)
S: controlled with last LDL <70 A/P:Continue current meds:  Atorvastatin 

## 2015-04-05 NOTE — Assessment & Plan Note (Signed)
S: controlled. On Amlodipine 2.5mg , carvedilol 12.5 mg BID, imdur  A/P:Continue current meds:  Could likely cut amlodipine- patient asymptomatic and does not want to reduce meds

## 2015-04-05 NOTE — Progress Notes (Signed)
Martin Conch, Martin James  Subjective:  Martin James is a 74 y.o. year old very pleasant male patient who presents with:  See problem oriented charting ROS- no chest pain, shortness of breath, nausea, vomiting. No ear pain- just pressure. No fevers, chills, nausea, vomiting  Past Medical History-  Patient Active Problem List   Diagnosis Date Noted  . CAD (coronary artery disease) 02/17/2007    Priority: High  . Hyperlipidemia 02/17/2007    Priority: Medium  . Essential hypertension 02/17/2007    Priority: Medium  . Headache 05/24/2014    Priority: Low  . INSOMNIA, PERSISTENT 03/03/2007    Priority: Low  . Depression 03/03/2007    Priority: Low  . SYNDROME, CARPAL TUNNEL 02/17/2007    Priority: Low  . Fatty liver 02/17/2007    Priority: Low  . Right shoulder pain 09/02/2014   Medications- reviewed and updated Current Outpatient Prescriptions  Medication Sig Dispense Refill  . amLODipine (NORVASC) 2.5 MG tablet Take 1 tablet (2.5 mg total) by mouth daily. 90 tablet 2  . aspirin 81 MG tablet Take 81 mg by mouth daily.      Marland Kitchen atorvastatin (LIPITOR) 40 MG tablet Take 1 tablet (40 mg total) by mouth daily. 90 tablet 3  . carvedilol (COREG) 12.5 MG tablet Take 1 tablet (12.5 mg total) by mouth 2 (two) times daily with a meal. 180 tablet 3  . ibuprofen (ADVIL,MOTRIN) 200 MG tablet Take 200 mg by mouth every 6 (six) hours as needed for moderate pain.    . isosorbide mononitrate (IMDUR) 60 MG 24 hr tablet TAKE ONE TABLET BY MOUTH ONCE DAILY 90 tablet 3   Objective: BP 100/74 mmHg  Pulse 69  Wt 154 lb (69.854 kg) Gen: NAD, resting comfortably CV: RRR no murmurs rubs or gallops Lungs: CTAB no crackles, wheeze, rhonchi Abdomen: soft/nontender/nondistended/normal bowel sounds. No rebound or guarding.  Ext: no edema Skin: warm, dry Neuro: grossly normal, moves all extremities  Assessment/Plan:  CAD (coronary artery disease) S: asymptomatic, controlled. Walked 70 miles last  week A/P:Continue current meds:  Aspirin, BP control with carvedilol and amlodipine, lipid control with atorvastatin, antianginal with imdur   Hyperlipidemia S: controlled with last LDL <70 A/P:Continue current meds:  Atorvastatin    Essential hypertension S: controlled. On Amlodipine 2.5mg , carvedilol 12.5 mg BID, imdur  A/P:Continue current meds:  Could likely cut amlodipine- patient asymptomatic and does not want to reduce meds   Sinusitis, bacterial based off >> 10 days S:Usually in fall, about 1-2 months of symptoms, green discharge from nose worse in the mornings. claritin did not help- feels more congested withthis. Mild sinus pressure and ear pressure. Nasal saline helps some. Years ago had similar symptoms and cleared with course of antibiotics.  A/P: treat with z-pak, return if no relief  Return precautions advised. 6 months for AWV otherwise   Meds ordered this encounter  Medications  . azithromycin (ZITHROMAX) 250 MG tablet    Sig: Take 2 tabs on day 1, then 1 tab daily until finished    Dispense:  6 tablet    Refill:  0

## 2015-04-05 NOTE — Patient Instructions (Signed)
Medication Instructions:  No changes, use 5 day z-pack to try to knock out this likely sinus infection  Testing/Procedures/Immunizations: You need a flu and pneumonia shot- you opted to get these at a later date  Follow-Up (all visit scheduling, rescheduling, cancellations including labs should be scheduled at front desk): 6 months  Sooner if you need [place] or if you have new or worsening symptoms. Or if symptoms from your sinuses do not improve-happy to see you back

## 2015-06-06 ENCOUNTER — Other Ambulatory Visit: Payer: Self-pay | Admitting: Family Medicine

## 2015-06-25 ENCOUNTER — Other Ambulatory Visit: Payer: Self-pay | Admitting: Family Medicine

## 2015-07-19 ENCOUNTER — Other Ambulatory Visit: Payer: Self-pay | Admitting: Family Medicine

## 2015-09-18 ENCOUNTER — Ambulatory Visit (INDEPENDENT_AMBULATORY_CARE_PROVIDER_SITE_OTHER): Payer: Medicare Other | Admitting: Cardiovascular Disease

## 2015-09-18 ENCOUNTER — Encounter: Payer: Self-pay | Admitting: Cardiovascular Disease

## 2015-09-18 VITALS — BP 110/70 | HR 70 | Ht 69.0 in | Wt 156.0 lb

## 2015-09-18 DIAGNOSIS — I10 Essential (primary) hypertension: Secondary | ICD-10-CM | POA: Diagnosis not present

## 2015-09-18 DIAGNOSIS — E785 Hyperlipidemia, unspecified: Secondary | ICD-10-CM | POA: Diagnosis not present

## 2015-09-18 DIAGNOSIS — I2510 Atherosclerotic heart disease of native coronary artery without angina pectoris: Secondary | ICD-10-CM

## 2015-09-18 DIAGNOSIS — Z955 Presence of coronary angioplasty implant and graft: Secondary | ICD-10-CM

## 2015-09-18 NOTE — Patient Instructions (Signed)
Your physician wants you to follow-up in: 1 YEAR WITH DR KONESWARAN You will receive a reminder letter in the mail two months in advance. If you don't receive a letter, please call our office to schedule the follow-up appointment.  Your physician recommends that you continue on your current medications as directed. Please refer to the Current Medication list given to you today.  Thank you for choosing [facility]!!    

## 2015-09-18 NOTE — Progress Notes (Signed)
Patient ID: Martin James, male   DOB: 02-09-1941, 75 y.o.   MRN: [id number]      SUBJECTIVE: The patient returns for routine follow up. He has a history of coronary artery disease and reportedly had 4 stents placed, 2 roughly 20 years ago and another 2 in 2005. He also has a history of hypertension and hyperlipidemia.   Echocardiogram on 09/15/14 demonstrated low normal to mildly reduced left ventricular systolic function, EF 45-50%, mild diffuse hypokinesis, grade 1 diastolic dysfunction, elevated filling pressures, and mild aortic root dilatation.  He continues to walk 8 miles daily and denies chest pain, shortness of breath, dizziness, and syncope.  He actually walked 78 miles last week and had a personal record with 18.4 miles in one day.   ECG performed in the office today demonstrates sinus rhythm with a nonspecific T-wave abnormality.   Review of Systems: As per "subjective", otherwise negative.  No Known Allergies  Current Outpatient Prescriptions  Medication Sig Dispense Refill  . amLODipine (NORVASC) 2.5 MG tablet TAKE ONE TABLET BY MOUTH ONCE DAILY 90 tablet 3  . aspirin 81 MG tablet Take 81 mg by mouth daily.      Marland Kitchen atorvastatin (LIPITOR) 40 MG tablet TAKE ONE TABLET BY MOUTH ONCE DAILY 90 tablet 3  . carvedilol (COREG) 12.5 MG tablet TAKE ONE TABLET BY MOUTH TWICE DAILY WITH A MEAL 180 tablet 3  . ibuprofen (ADVIL,MOTRIN) 200 MG tablet Take 200 mg by mouth every 6 (six) hours as needed for moderate pain.    . isosorbide mononitrate (IMDUR) 60 MG 24 hr tablet TAKE ONE TABLET BY MOUTH ONCE DAILY 90 tablet 3   No current facility-administered medications for this visit.    Past Medical History  Diagnosis Date  . CAD (coronary artery disease)   . Hyperlipidemia   . Hypertension   . Depression   . Insomnia   . Allergy   . Sinus congestion   . Cataract     Past Surgical History  Procedure Laterality Date  . Appendectomy    . Undescended testicle surgery    .  Coronary angioplasty      stents x4  . Cataract extraction w/phaco Right 07/08/2013    Procedure: CATARACT EXTRACTION PHACO AND INTRAOCULAR LENS PLACEMENT (IOC);  Surgeon: Gemma Payor, MD;  Location: [facility];  Service: Ophthalmology;  Laterality: Right;  CDE 26.34    Social History   Social History  . Marital Status: Married    Spouse Name: N/A  . Number of Children: N/A  . Years of Education: N/A   Occupational History  . retired    Social History Main Topics  . Smoking status: Former Smoker -- 0.25 packs/day for 30 years    Types: Cigarettes    Quit date: 08/05/1990  . Smokeless tobacco: Never Used  . Alcohol Use: 1.2 oz/week    1 Glasses of wine, 1 Cans of beer per week     Comment: occassional  . Drug Use: No  . Sexual Activity: Yes    Birth Control/ Protection: None   Other Topics Concern  . Not on file   Social History Narrative   Married 1964. 2 kids-son, daughter. 5 grandkids.       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      Hobbies: walk 8 miles a day, neighborhood Dispensing optician Vitals:   09/18/15 1042  BP: 110/70  Pulse: 70  Height:  (1.753 m)  Weight: 156 lb (70.761 kg)  SpO2: 98%    PHYSICAL EXAM General: NAD HEENT: Normal. Neck: No JVD, no thyromegaly. Lungs: Clear to auscultation bilaterally with normal respiratory effort. CV: Nondisplaced PMI.  Regular rate and rhythm, normal S1/S2, no S3/S4, no murmur. No pretibial or periankle edema.  No carotid bruit.   Abdomen: Soft, nontender, no distention.  Neurologic: Alert and oriented.  Psych: Normal affect. Skin: Normal. Musculoskeletal: No gross deformities.  ECG: Most recent ECG reviewed.      ASSESSMENT AND PLAN: 1. CAD with history of multiple stents: Symptomatically stable. Continue ASA, Lipitor, Imdur, and Coreg.  2. Essential HTN: Well controlled on Coreg. No changes.  3. Hyperlipidemia: Most recent lipids from 09/26/14 showed total cholesterol 119, triglycerides 50, HDL 39, LDL 69.  Will need to be repeated. Continue Lipitor 40 mg.  Dispo: f/u 1 year.  Prentice Docker, M.D., F.A.C.C.

## 2015-10-03 ENCOUNTER — Ambulatory Visit (INDEPENDENT_AMBULATORY_CARE_PROVIDER_SITE_OTHER): Payer: Medicare Other | Admitting: Family Medicine

## 2015-10-03 ENCOUNTER — Encounter: Payer: Self-pay | Admitting: Family Medicine

## 2015-10-03 VITALS — BP 120/60 | HR 69 | Temp 97.7°F | Ht 69.0 in | Wt 155.0 lb

## 2015-10-03 DIAGNOSIS — E785 Hyperlipidemia, unspecified: Secondary | ICD-10-CM | POA: Diagnosis not present

## 2015-10-03 DIAGNOSIS — I10 Essential (primary) hypertension: Secondary | ICD-10-CM | POA: Diagnosis not present

## 2015-10-03 DIAGNOSIS — I2510 Atherosclerotic heart disease of native coronary artery without angina pectoris: Secondary | ICD-10-CM | POA: Diagnosis not present

## 2015-10-03 DIAGNOSIS — Z0000 Encounter for general adult medical examination without abnormal findings: Secondary | ICD-10-CM | POA: Diagnosis not present

## 2015-10-03 LAB — LIPID PANEL
CHOL/HDL RATIO: 3
Cholesterol: 116 mg/dL (ref 0–200)
HDL: 37.7 mg/dL — AB (ref >39.00)
LDL CALC: 68 mg/dL (ref 0–99)
NONHDL: 77.94
Triglycerides: 49 mg/dL (ref 0.0–149.0)
VLDL: 9.8 mg/dL (ref 0.0–40.0)

## 2015-10-03 LAB — COMPREHENSIVE METABOLIC PANEL
ALT: 20 U/L (ref 0–53)
AST: 26 U/L (ref 0–37)
Albumin: 4 g/dL (ref 3.5–5.2)
Alkaline Phosphatase: 51 U/L (ref 39–117)
BUN: 16 mg/dL (ref 6–23)
CO2: 30 meq/L (ref 19–32)
Calcium: 9.1 mg/dL (ref 8.4–10.5)
Chloride: 103 mEq/L (ref 96–112)
Creatinine, Ser: 0.95 mg/dL (ref 0.40–1.50)
GFR: 81.96 mL/min (ref >60.00)
Glucose, Bld: 102 mg/dL — ABNORMAL HIGH (ref 70–99)
POTASSIUM: 4.4 meq/L (ref 3.5–5.1)
Sodium: 137 mEq/L (ref 135–145)
Total Bilirubin: 1 mg/dL (ref 0.2–1.2)
Total Protein: 7 g/dL (ref 6.0–8.3)

## 2015-10-03 LAB — CBC
HEMATOCRIT: 41.2 % (ref 39.0–52.0)
HEMOGLOBIN: 14.1 g/dL (ref 13.0–17.0)
MCHC: 34.3 g/dL (ref 30.0–36.0)
MCV: 92.6 fl (ref 78.0–100.0)
Platelets: 194 10*3/uL (ref 150.0–400.0)
RBC: 4.45 Mil/uL (ref 4.22–5.81)
RDW: 13.7 % (ref 11.5–15.5)
WBC: 6.2 10*3/uL (ref 4.0–10.5)

## 2015-10-03 NOTE — Progress Notes (Signed)
Tana Conch, MD Phone: [phone]  Subjective:  Patient presents today for their annual physical. Chief complaint-noted.   See problem oriented charting- ROS- full  review of systems was completed and negative including No chest pain or shortness of breath. Occasional headache but no  blurry vision.    The following were reviewed and entered/updated in epic: Past Medical History  Diagnosis Date  . CAD (coronary artery disease)   . Hyperlipidemia   . Hypertension   . Depression   . Insomnia   . Allergy   . Sinus congestion   . Cataract    Patient Active Problem List   Diagnosis Date Noted  . CAD (coronary artery disease) 02/17/2007    Priority: High  . Hyperlipidemia 02/17/2007    Priority: Medium  . Essential hypertension 02/17/2007    Priority: Medium  . Headache 05/24/2014    Priority: Low  . INSOMNIA, PERSISTENT 03/03/2007    Priority: Low  . Depression 03/03/2007    Priority: Low  . SYNDROME, CARPAL TUNNEL 02/17/2007    Priority: Low  . Fatty liver 02/17/2007    Priority: Low  . Right shoulder pain 09/02/2014   Past Surgical History  Procedure Laterality Date  . Appendectomy    . Undescended testicle surgery    . Coronary angioplasty      stents x4  . Cataract extraction w/phaco Right 07/08/2013    Procedure: CATARACT EXTRACTION PHACO AND INTRAOCULAR LENS PLACEMENT (IOC);  Surgeon: Gemma Payor, MD;  Location: [facility];  Service: Ophthalmology;  Laterality: Right;  CDE 26.34    Family History  Problem Relation Age of Onset  . Diabetes Father 75  . Stroke Father     smoker  . Colon cancer Neg Hx   . Rectal cancer Neg Hx   . Stomach cancer Neg Hx     Medications- reviewed and updated Current Outpatient Prescriptions  Medication Sig Dispense Refill  . amLODipine (NORVASC) 2.5 MG tablet TAKE ONE TABLET BY MOUTH ONCE DAILY 90 tablet 3  . aspirin 81 MG tablet Take 81 mg by mouth daily.      Marland Kitchen atorvastatin (LIPITOR) 40 MG tablet TAKE ONE TABLET BY  MOUTH ONCE DAILY 90 tablet 3  . carvedilol (COREG) 12.5 MG tablet TAKE ONE TABLET BY MOUTH TWICE DAILY WITH A MEAL 180 tablet 3  . ibuprofen (ADVIL,MOTRIN) 200 MG tablet Take 200 mg by mouth every 6 (six) hours as needed for moderate pain. Reported on 10/03/2015    . isosorbide mononitrate (IMDUR) 60 MG 24 hr tablet TAKE ONE TABLET BY MOUTH ONCE DAILY 90 tablet 3   No current facility-administered medications for this visit.    Allergies-reviewed and updated No Known Allergies  Social History   Social History  . Marital Status: Married    Spouse Name: N/A  . Number of Children: N/A  . Years of Education: N/A   Occupational History  . retired    Social History Main Topics  . Smoking status: Former Smoker -- 0.25 packs/day for 30 years    Types: Cigarettes    Quit date: 08/05/1990  . Smokeless tobacco: Never Used  . Alcohol Use: 1.2 oz/week    1 Glasses of wine, 1 Cans of beer per week     Comment: occassional  . Drug Use: No  . Sexual Activity: Yes    Birth Control/ Protection: None   Other Topics Concern  . None   Social History Narrative   Married 1964. 2 kids-son, daughter. 5 grandkids.  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      Hobbies: walk 8 miles a day, neighborhood handyman    ROS--See HPI   Objective: BP 120/60 mmHg  Pulse 69  Temp(Src) 97.7 F (36.5 C)  Ht  (1.753 m)  Wt 155 lb (70.308 kg)  BMI 22.88 kg/m2 Gen: NAD, resting comfortably HEENT: Mucous membranes are moist. Oropharynx normal Neck: no thyromegaly CV: RRR no murmurs rubs or gallops Lungs: CTAB no crackles, wheeze, rhonchi Abdomen: soft/nontender/nondistended/normal bowel sounds. No rebound or guarding.  Ext: no edema Skin: warm, dry Neuro: grossly normal, moves all extremities, PERRLA  Rectal opts out  Assessment/Plan:  75 y.o. male presenting for annual physical.  Health Maintenance counseling: 1. Anticipatory guidance: Patient counseled regarding regular dental  exams-thinking about dentures, eye exams, wearing seatbelts.  2. Risk factor reduction:  Advised patient of need for regular exercise and diet rich and fruits and vegetables to reduce risk of heart attack and stroke. Walking 70 miles a week 3. Immunizations/screenings/ancillary studies- offered shingles vaccine -will check with insurance, otherwise up to date Immunization History  Administered Date(s) Administered  . Influenza Whole 06/04/2007, 06/07/2009, 05/03/2010  . Influenza, High Dose Seasonal PF 05/10/2013  . Influenza-Unspecified 05/14/2014, 06/21/2015  . Pneumococcal Conjugate-13 06/21/2015  . Pneumococcal Polysaccharide-23 06/04/2007  . Tdap 06/11/2012   4. Prostate cancer screening- nocturia 0-2x a night with no recent change- usually 2x a night if beer late in evening. Opts out of prostate cancer screening.   Lab Results  Component Value Date   PSA 0.58 06/03/2011   PSA 0.48 04/26/2010   PSA 0.51 05/26/2008   5. Colon cancer screening - 07/12/14 and no recall per GI due to age 51. Skin cancer screening- low risk above the waist exam today, declines any other changing lesions on skin  CAD- 4 stents 2005. Compliant with aspirin, coreg, statin, imdur. No chest pain. Recently saw cardiology. Walks about 70 miles a week still.  Hyperlipidemia- on atorvastatin  due to cost Hypertension- controlled on amlodipine 2.5, coreg 12.5 BID, imdur .    6 months follow up. Then will schedule physical 18 months. Will start seeing me yearly and cardiology yearly but see one of [place] at least every 6 months.  Return precautions advised.   fasting Orders Placed This Encounter  Procedures  . CBC    [place]  . Comprehensive metabolic panel    [place]    Order Specific Question:  Has the patient fasted?    Answer:  No  . Lipid panel    [place]    Order Specific Question:  Has the patient fasted?    Answer:  No

## 2015-10-03 NOTE — Patient Instructions (Addendum)
For shingles vaccine- check with your insurance- likely they will have you get it at a pharmacy.   Please check blood pressure once a month. Your goal is <140/90, if above this please see me in 6 months or sooner but otherwise I will see you in a year

## 2016-03-21 ENCOUNTER — Other Ambulatory Visit: Payer: Self-pay | Admitting: Family Medicine

## 2016-04-02 ENCOUNTER — Encounter: Payer: Self-pay | Admitting: Family Medicine

## 2016-04-02 ENCOUNTER — Ambulatory Visit (INDEPENDENT_AMBULATORY_CARE_PROVIDER_SITE_OTHER): Payer: Medicare Other | Admitting: Family Medicine

## 2016-04-02 VITALS — BP 108/72 | HR 79 | Temp 97.6°F | Wt 160.8 lb

## 2016-04-02 DIAGNOSIS — I25118 Atherosclerotic heart disease of native coronary artery with other forms of angina pectoris: Secondary | ICD-10-CM

## 2016-04-02 DIAGNOSIS — E785 Hyperlipidemia, unspecified: Secondary | ICD-10-CM

## 2016-04-02 DIAGNOSIS — J321 Chronic frontal sinusitis: Secondary | ICD-10-CM | POA: Diagnosis not present

## 2016-04-02 DIAGNOSIS — I10 Essential (primary) hypertension: Secondary | ICD-10-CM

## 2016-04-02 MED ORDER — AMOXICILLIN-POT CLAVULANATE 875-125 MG PO TABS: 1.0000 | ORAL_TABLET | Freq: Two times a day (BID) | ORAL | 0 refills | Status: DC

## 2016-04-02 NOTE — Progress Notes (Signed)
Subjective:  Martin James is a 75 y.o. year old very pleasant male patient who presents for/with See problem oriented charting ROS- No chest pain or shortness of breath. No headache or blurry vision.see any ROS included in HPI as well.   Past Medical History-  Patient Active Problem List   Diagnosis Date Noted  . CAD (coronary artery disease) 02/17/2007    Priority: High  . Hyperlipidemia 02/17/2007    Priority: Medium  . Essential hypertension 02/17/2007    Priority: Medium  . Headache 05/24/2014    Priority: Low  . INSOMNIA, PERSISTENT 03/03/2007    Priority: Low  . Depression 03/03/2007    Priority: Low  . SYNDROME, CARPAL TUNNEL 02/17/2007    Priority: Low  . Fatty liver 02/17/2007    Priority: Low  . Right shoulder pain 09/02/2014    Medications- reviewed and updated Current Outpatient Prescriptions  Medication Sig Dispense Refill  . amLODipine (NORVASC) 2.5 MG tablet TAKE ONE TABLET BY MOUTH ONCE DAILY 90 tablet 3  . aspirin 81 MG tablet Take 81 mg by mouth daily.      Marland Kitchen. atorvastatin (LIPITOR) 40 MG tablet TAKE ONE TABLET BY MOUTH ONCE DAILY 90 tablet 3  . carvedilol (COREG) 12.5 MG tablet TAKE ONE TABLET BY MOUTH TWICE DAILY WITH A MEAL 180 tablet 3  . ibuprofen (ADVIL,MOTRIN) 200 MG tablet Take 200 mg by mouth every 6 (six) hours as needed for moderate pain. Reported on 10/03/2015    . isosorbide mononitrate (IMDUR) 60 MG 24 hr tablet TAKE ONE TABLET BY MOUTH ONCE DAILY 90 tablet 3  . amoxicillin-clavulanate (AUGMENTIN) 875-125 MG tablet Take 1 tablet by mouth 2 (two) times daily. 28 tablet 0   No current facility-administered medications for this visit.     Objective: BP 108/72 (BP Location: Left Arm, Patient Position: Sitting, Cuff Size: Normal)   Pulse 79   Temp 97.6 F (36.4 C) (Oral)   Wt 160 lb 12.8 oz (72.9 kg)   SpO2 96%   BMI 23.75 kg/m  Gen: NAD, resting comfortably TM normal, nares mildly erythematous with yellow drainage, poor dentition, oropharynx  normal otherwise CV: RRR no murmurs rubs or gallops Lungs: CTAB no crackles, wheeze, rhonchi Abdomen: soft/nontender/nondistended/normal bowel sounds. No rebound or guarding.  Ext: no edema Skin: warm, dry Neuro: grossly normal, moves all extremities  Assessment/Plan:  CAD HTN HLD S: Patient is compliant with his aspirin, carvedilol, imdur for BP and CAD history. On this regimen does not get any chest pain or shortness of breath. He is also compliant with atorvastatin 40mg  without mylagias and LDL 68 in february. BP well controlled on amlodipine 2.5, imdur 60mg , coreg 12.5mg  BID. We have discussed reducing BP meds in past and patient has declined. He walked 84 miles this past week and averages around 70.  A/P: CAD- asymptomatic, no change in therapy. Angina controlled with imdur HTN- controlled, no change in therapy HLD- controlled, no change in therapy   chronic sinus issues S: states almost lifelong issues with yellow/green sputum. Did clear almost completley on azithromycin last year but then returned A/P: trial 2 weeks of augmentin  Return in about 9 months (around 12/31/2016) for physical. States will get flu shot later  Meds ordered this encounter  Medications  . amoxicillin-clavulanate (AUGMENTIN) 875-125 MG tablet    Sig: Take 1 tablet by mouth 2 (two) times daily.    Dispense:  28 tablet    Refill:  0    Return precautions advised.  Garret Reddish, MD

## 2016-04-02 NOTE — Patient Instructions (Signed)
No changes in regular medicines  Given chronic sinus issues- try prolonged course of antibiotics for 2 weeks. Hopeful this clears issue for you

## 2016-04-02 NOTE — Progress Notes (Signed)
Pre visit review using our clinic review tool, if applicable. No additional management support is needed unless otherwise documented below in the visit note. 

## 2016-06-02 ENCOUNTER — Other Ambulatory Visit: Payer: Self-pay | Admitting: Family Medicine

## 2016-06-23 ENCOUNTER — Other Ambulatory Visit: Payer: Self-pay | Admitting: Family Medicine

## 2016-07-14 ENCOUNTER — Other Ambulatory Visit: Payer: Self-pay | Admitting: Family Medicine

## 2016-09-18 ENCOUNTER — Encounter: Payer: Self-pay | Admitting: *Deleted

## 2016-09-19 ENCOUNTER — Encounter: Payer: Self-pay | Admitting: Cardiovascular Disease

## 2016-09-19 ENCOUNTER — Ambulatory Visit (INDEPENDENT_AMBULATORY_CARE_PROVIDER_SITE_OTHER): Payer: Medicare Other | Admitting: Cardiovascular Disease

## 2016-09-19 ENCOUNTER — Encounter: Payer: Self-pay | Admitting: *Deleted

## 2016-09-19 VITALS — BP 98/70 | HR 68 | Ht 69.0 in | Wt 160.0 lb

## 2016-09-19 DIAGNOSIS — E782 Mixed hyperlipidemia: Secondary | ICD-10-CM | POA: Diagnosis not present

## 2016-09-19 DIAGNOSIS — I25118 Atherosclerotic heart disease of native coronary artery with other forms of angina pectoris: Secondary | ICD-10-CM

## 2016-09-19 DIAGNOSIS — I10 Essential (primary) hypertension: Secondary | ICD-10-CM | POA: Diagnosis not present

## 2016-09-19 DIAGNOSIS — Z955 Presence of coronary angioplasty implant and graft: Secondary | ICD-10-CM | POA: Diagnosis not present

## 2016-09-19 NOTE — Progress Notes (Signed)
SUBJECTIVE: The patient returns for routine follow up. He has a history of coronary artery disease and reportedly had 4 stents placed, 2 roughly 20 years ago and another 2 in 2005. He also has a history of hypertension and hyperlipidemia.   Echocardiogram on 09/15/14 demonstrated low normal to mildly reduced left ventricular systolic function, EF 45-50%, mild diffuse hypokinesis, grade 1 diastolic dysfunction, elevated filling pressures, and mild aortic root dilatation.  He now walks 13 miles daily and did 94 miles last week.  He denies chest pain, shortness of breath, dizziness, and syncope.  He finds great benefits using a FitBit.  ECG performed in the office today demonstrates sinus rhythm with a nonspecific T-wave abnormality and an isolated PVC.   Review of Systems: As per "subjective", otherwise negative.  No Known Allergies  Current Outpatient Prescriptions  Medication Sig Dispense Refill  . amLODipine (NORVASC) 2.5 MG tablet TAKE ONE TABLET BY MOUTH ONCE DAILY 90 tablet 3  . aspirin 81 MG tablet Take 81 mg by mouth daily.      Marland Kitchen atorvastatin (LIPITOR) 40 MG tablet TAKE ONE TABLET BY MOUTH ONCE DAILY 90 tablet 3  . carvedilol (COREG) 12.5 MG tablet TAKE ONE TABLET BY MOUTH TWICE DAILY WITH A MEAL 180 tablet 3  . ibuprofen (ADVIL,MOTRIN) 200 MG tablet Take 200 mg by mouth every 6 (six) hours as needed for moderate pain. Reported on 10/03/2015    . isosorbide mononitrate (IMDUR) 60 MG 24 hr tablet TAKE ONE TABLET BY MOUTH ONCE DAILY 90 tablet 3   No current facility-administered medications for this visit.     Past Medical History:  Diagnosis Date  . Allergy   . CAD (coronary artery disease)   . Cataract   . Depression   . Hyperlipidemia   . Hypertension   . Insomnia   . Sinus congestion     Past Surgical History:  Procedure Laterality Date  . APPENDECTOMY    . CATARACT EXTRACTION W/PHACO Right 07/08/2013   Procedure: CATARACT EXTRACTION PHACO AND  INTRAOCULAR LENS PLACEMENT (IOC);  Surgeon: Gemma Payor, MD;  Location: [facility];  Service: Ophthalmology;  Laterality: Right;  CDE 26.34  . CORONARY ANGIOPLASTY     stents x4  . undescended testicle surgery      Social History   Social History  . Marital status: Married    Spouse name: N/A  . Number of children: N/A  . Years of education: N/A   Occupational History  . retired    Social History Main Topics  . Smoking status: Former Smoker    Packs/day: 0.25    Years: 30.00    Types: Cigarettes    Quit date: 08/05/1990  . Smokeless tobacco: Never Used  . Alcohol use 1.2 oz/week    1 Glasses of wine, 1 Cans of beer per week     Comment: occassional  . Drug use: No  . Sexual activity: Yes    Birth control/ protection: None   Other Topics Concern  . Not on file   Social History Narrative   Married 1964. 2 kids-son, daughter. 5 grandkids.       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      Hobbies: walk 8 miles a day, neighborhood handyman     Vitals:   09/19/16 0824  BP: 98/70  Pulse: 68  SpO2: 97%  Weight: 160 lb (72.6 kg)  Height: 5\' 9"  (1.753 m)    PHYSICAL EXAM General: NAD HEENT: Normal. Neck: No JVD,  no thyromegaly. Lungs: Clear to auscultation bilaterally with normal respiratory effort. CV: Nondisplaced PMI.  Regular rate and rhythm, normal S1/S2, no S3/S4, no murmur. No pretibial or periankle edema.  No carotid bruit.   Abdomen: Soft, nontender, no distention.  Neurologic: Alert and oriented.  Psych: Normal affect. Skin: Normal. Musculoskeletal: No gross deformities.    ECG: Most recent ECG reviewed.      ASSESSMENT AND PLAN: 1. CAD with history of multiple stents: Symptomatically stable. Continue ASA, Lipitor, Imdur, and Coreg.  2. Essential HTN: Well controlled on Coreg. Will dc amlodipine.  3. Hyperlipidemia: Most recent lipids from 10/03/15 showed total cholesterol 116, triglycerides 49, HDL 37, LDL 68. Obtain copy from PCP of most recent values.  Continue Lipitor 40 mg.  Dispo: f/u 1 year.   Prentice DockerSuresh Tymika Grilli, M.D., F.A.C.C.

## 2016-09-19 NOTE — Patient Instructions (Signed)

## 2016-12-31 ENCOUNTER — Ambulatory Visit (INDEPENDENT_AMBULATORY_CARE_PROVIDER_SITE_OTHER): Payer: Medicare Other | Admitting: Family Medicine

## 2016-12-31 ENCOUNTER — Encounter: Payer: Self-pay | Admitting: Family Medicine

## 2016-12-31 VITALS — BP 104/78 | HR 59 | Temp 97.7°F | Ht 69.0 in | Wt 159.6 lb

## 2016-12-31 DIAGNOSIS — I25118 Atherosclerotic heart disease of native coronary artery with other forms of angina pectoris: Secondary | ICD-10-CM | POA: Diagnosis not present

## 2016-12-31 DIAGNOSIS — H532 Diplopia: Secondary | ICD-10-CM | POA: Diagnosis not present

## 2016-12-31 DIAGNOSIS — I10 Essential (primary) hypertension: Secondary | ICD-10-CM | POA: Diagnosis not present

## 2016-12-31 DIAGNOSIS — E782 Mixed hyperlipidemia: Secondary | ICD-10-CM

## 2016-12-31 LAB — COMPREHENSIVE METABOLIC PANEL
ALK PHOS: 50 U/L (ref 39–117)
ALT: 23 U/L (ref 0–53)
AST: 24 U/L (ref 0–37)
Albumin: 4.3 g/dL (ref 3.5–5.2)
BILIRUBIN TOTAL: 0.8 mg/dL (ref 0.2–1.2)
BUN: 17 mg/dL (ref 6–23)
CALCIUM: 9.8 mg/dL (ref 8.4–10.5)
CO2: 29 mEq/L (ref 19–32)
Chloride: 103 mEq/L (ref 96–112)
Creatinine, Ser: 1.01 mg/dL (ref 0.40–1.50)
GFR: 76.11 mL/min (ref >60.00)
Glucose, Bld: 103 mg/dL — ABNORMAL HIGH (ref 70–99)
Potassium: 4.8 mEq/L (ref 3.5–5.1)
Sodium: 138 mEq/L (ref 135–145)
TOTAL PROTEIN: 7 g/dL (ref 6.0–8.3)

## 2016-12-31 LAB — POC URINALSYSI DIPSTICK (AUTOMATED)
BILIRUBIN UA: NEGATIVE
Blood, UA: NEGATIVE
GLUCOSE UA: NEGATIVE
Ketones, UA: NEGATIVE
LEUKOCYTES UA: NEGATIVE
NITRITE UA: NEGATIVE
PH UA: 6 (ref 5.0–8.0)
Protein, UA: NEGATIVE
Spec Grav, UA: >=1.03 — AB (ref 1.010–1.025)
Urobilinogen, UA: 0.2 E.U./dL

## 2016-12-31 LAB — CBC
HEMATOCRIT: 42.1 % (ref 39.0–52.0)
HEMOGLOBIN: 14.2 g/dL (ref 13.0–17.0)
MCHC: 33.8 g/dL (ref 30.0–36.0)
MCV: 95.5 fl (ref 78.0–100.0)
PLATELETS: 231 10*3/uL (ref 150.0–400.0)
RBC: 4.41 Mil/uL (ref 4.22–5.81)
RDW: 13.3 % (ref 11.5–15.5)
WBC: 7.9 10*3/uL (ref 4.0–10.5)

## 2016-12-31 LAB — LIPID PANEL
CHOLESTEROL: 125 mg/dL (ref 0–200)
HDL: 38.2 mg/dL — AB (ref >39.00)
LDL Cholesterol: 73 mg/dL (ref 0–99)
NonHDL: 87.2
TRIGLYCERIDES: 70 mg/dL (ref 0.0–149.0)
Total CHOL/HDL Ratio: 3
VLDL: 14 mg/dL (ref 0.0–40.0)

## 2016-12-31 NOTE — Assessment & Plan Note (Signed)
S: intermittent issues with double vision. Has seen optho x2 and they thought cataract related-surgery and did not improve. Double vision usually when reading with both eyes open. Resolves with closing either eye.Issues for years. Not getting worse- if concentrates goes away  Describes the other set of words being up and at a very slight angle A/P:   Will get records- consider referral for another opinion depending on findings.

## 2016-12-31 NOTE — Progress Notes (Signed)
Subjective:  Martin James is a 76 y.o. year old very pleasant male patient who presents for/with See problem oriented charting ROS- No chest pain or shortness of breath. No headache. Does have some double vision with reading as noted below.    Past Medical History-  Patient Active Problem List   Diagnosis Date Noted  . CAD (coronary artery disease) 02/17/2007    Priority: High  . Binocular diplopia 12/31/2016    Priority: Medium  . Hyperlipidemia 02/17/2007    Priority: Medium  . Essential hypertension 02/17/2007    Priority: Medium  . Headache 05/24/2014    Priority: Low  . INSOMNIA, PERSISTENT 03/03/2007    Priority: Low  . Depression 03/03/2007    Priority: Low  . SYNDROME, CARPAL TUNNEL 02/17/2007    Priority: Low  . Fatty liver 02/17/2007    Priority: Low  . Right shoulder pain 09/02/2014    Medications- reviewed and updated Current Outpatient Prescriptions  Medication Sig Dispense Refill  . aspirin 81 MG tablet Take 81 mg by mouth daily.      Marland Kitchen. atorvastatin (LIPITOR) 40 MG tablet TAKE ONE TABLET BY MOUTH ONCE DAILY 90 tablet 3  . carvedilol (COREG) 12.5 MG tablet TAKE ONE TABLET BY MOUTH TWICE DAILY WITH A MEAL 180 tablet 3  . ibuprofen (ADVIL,MOTRIN) 200 MG tablet Take 200 mg by mouth every 6 (six) hours as needed for moderate pain. Reported on 10/03/2015    . isosorbide mononitrate (IMDUR) 60 MG 24 hr tablet TAKE ONE TABLET BY MOUTH ONCE DAILY 90 tablet 3   No current facility-administered medications for this visit.     Objective: BP 104/78 (BP Location: Left Arm, Patient Position: Sitting, Cuff Size: Normal)   Pulse (!) 59   Temp 97.7 F (36.5 C) (Oral)   Ht 5\' 9"  (1.753 m)   Wt 159 lb 9.6 oz (72.4 kg)   SpO2 98%   BMI 23.57 kg/m  Gen: NAD, resting comfortably CV: RRR no murmurs rubs or gallops Lungs: CTAB no crackles, wheeze, rhonchi Ext: no edema Skin: warm, dry Neuro: CN II-XII intact, sensation and reflexes normal throughout, 5/5 muscle strength in  bilateral upper and lower extremities. Normal finger to nose. Normal rapid alternating movements. No pronator drift. Normal romberg. Normal gait.   Assessment/Plan:  CAD Hyperlipidemia Hypertension S: same as last visit- compliant with aspirin, carvedilol, imdur. This is controlling his blood pressure and he has not had any chest pain (as long as on imdur) or shortness of breath with this. He is also on amlodipine for blood pressure  For hyperlipidemia portion- remains on atorvastatin 40mg - discussed updating lipids - he agrees  He is still walking 70 miles a week. Mild hyperglycemia on last check at 102- will minotor this.  A/P:continue current meds except stop amlodipine as per cardiology notes- see notes below.  Update labs   Binocular diplopia S: intermittent issues with double vision. Has seen optho x2 and they thought cataract related-surgery and did not improve. Double vision usually when reading with both eyes open. Resolves with closing either eye.Issues for years. Not getting worse- if concentrates goes away  Describes the other set of words being up and at a very slight angle A/P:   Will get records- consider referral for another opinion depending on findings.   Essential hypertension Per prior notes "Could likely cut amlodipine- patient asymptomatic and does not want to reduce meds"  Cardiology suggested stopping amlodipine as well so patient agrees to do this at this  point with goal <130/90 at least.  Continue carvedilol 12.5 mg BID, imdur 60mg .     Return in about 6 months (around 07/03/2017) for physical. Declines shingrix for now  Orders Placed This Encounter  Procedures  . CBC    [place]  . Comprehensive metabolic panel    [place]    Order Specific Question:   Has the patient fasted?    Answer:   No  . Lipid panel    [place]    Order Specific Question:   Has the patient fasted?    Answer:   No  . POCT Urinalysis Dipstick (Automated)    Meds ordered this  encounter  Medications  . DISCONTD: amLODipine (NORVASC) 2.5 MG tablet    Sig: Take 1 tablet by mouth daily.    Return precautions advised.  Tana Conch, MD

## 2016-12-31 NOTE — Assessment & Plan Note (Signed)
Per prior notes "Could likely cut amlodipine- patient asymptomatic and does not want to reduce meds"  Cardiology suggested stopping amlodipine as well so patient agrees to do this at this point with goal <130/90 at least.  Continue carvedilol 12.5 mg BID, imdur 60mg .

## 2016-12-31 NOTE — Patient Instructions (Addendum)
Stop amlodipine as per cardiology notes. I concur with this as blood pressure very low- let me know if pressure gets above 130/80.   Lets check back in 6 months from now  Please Sign release of information at the check out desk for records from both eye doctors that saw you. I am considering getting a 3rd opinion. If worsening symptoms please see us immediately ______________________________________________________________________  Starting October 1st 2018, I will be transferring to our new location: [place] [id number] ([address]- [place] from [employer] [address] [place]) [place] [id number] Phone: [phone]  I would love to have you remain my patient at this new location as long as it remains convenient for you. I am excited about the opportunity to have x-ray and sports medicine in the new building but will really miss the awesome staff and physicians at [facility]. Continue to schedule appointments at [facility] and we will automatically transfer them to the [facility] location starting October 1st.

## 2017-03-16 ENCOUNTER — Other Ambulatory Visit: Payer: Self-pay | Admitting: Family Medicine

## 2017-06-17 ENCOUNTER — Other Ambulatory Visit: Payer: Self-pay | Admitting: Family Medicine

## 2017-07-10 ENCOUNTER — Encounter: Payer: Self-pay | Admitting: Family Medicine

## 2017-07-10 ENCOUNTER — Ambulatory Visit (INDEPENDENT_AMBULATORY_CARE_PROVIDER_SITE_OTHER): Payer: Medicare Other | Admitting: Family Medicine

## 2017-07-10 VITALS — BP 114/70 | HR 66 | Temp 97.5°F | Ht 68.5 in | Wt 160.2 lb

## 2017-07-10 DIAGNOSIS — Z0001 Encounter for general adult medical examination with abnormal findings: Secondary | ICD-10-CM

## 2017-07-10 DIAGNOSIS — I10 Essential (primary) hypertension: Secondary | ICD-10-CM | POA: Diagnosis not present

## 2017-07-10 DIAGNOSIS — E782 Mixed hyperlipidemia: Secondary | ICD-10-CM | POA: Diagnosis not present

## 2017-07-10 DIAGNOSIS — I25118 Atherosclerotic heart disease of native coronary artery with other forms of angina pectoris: Secondary | ICD-10-CM | POA: Diagnosis not present

## 2017-07-10 DIAGNOSIS — H532 Diplopia: Secondary | ICD-10-CM | POA: Diagnosis not present

## 2017-07-10 DIAGNOSIS — J329 Chronic sinusitis, unspecified: Secondary | ICD-10-CM | POA: Insufficient documentation

## 2017-07-10 DIAGNOSIS — J324 Chronic pansinusitis: Secondary | ICD-10-CM

## 2017-07-10 LAB — POC URINALSYSI DIPSTICK (AUTOMATED)
Bilirubin, UA: NEGATIVE
Blood, UA: NEGATIVE
Glucose, UA: NEGATIVE
KETONES UA: NEGATIVE
Leukocytes, UA: NEGATIVE
Nitrite, UA: NEGATIVE
PH UA: 6 (ref 5.0–8.0)
PROTEIN UA: NEGATIVE
Urobilinogen, UA: 0.2 E.U./dL

## 2017-07-10 LAB — COMPREHENSIVE METABOLIC PANEL
ALBUMIN: 4.1 g/dL (ref 3.5–5.2)
ALT: 25 U/L (ref 0–53)
AST: 29 U/L (ref 0–37)
Alkaline Phosphatase: 56 U/L (ref 39–117)
BILIRUBIN TOTAL: 1 mg/dL (ref 0.2–1.2)
BUN: 15 mg/dL (ref 6–23)
CALCIUM: 8.9 mg/dL (ref 8.4–10.5)
CHLORIDE: 104 meq/L (ref 96–112)
CO2: 28 mEq/L (ref 19–32)
CREATININE: 1.03 mg/dL (ref 0.40–1.50)
GFR: 74.31 mL/min (ref >60.00)
Glucose, Bld: 100 mg/dL — ABNORMAL HIGH (ref 70–99)
Potassium: 4.9 mEq/L (ref 3.5–5.1)
Sodium: 137 mEq/L (ref 135–145)
Total Protein: 6.6 g/dL (ref 6.0–8.3)

## 2017-07-10 LAB — CBC
HCT: 40.5 % (ref 39.0–52.0)
Hemoglobin: 13.6 g/dL (ref 13.0–17.0)
MCHC: 33.6 g/dL (ref 30.0–36.0)
MCV: 96.1 fl (ref 78.0–100.0)
PLATELETS: 198 10*3/uL (ref 150.0–400.0)
RBC: 4.22 Mil/uL (ref 4.22–5.81)
RDW: 13.4 % (ref 11.5–15.5)
WBC: 5.5 10*3/uL (ref 4.0–10.5)

## 2017-07-10 LAB — LDL CHOLESTEROL, DIRECT: LDL DIRECT: 60 mg/dL

## 2017-07-10 MED ORDER — AMOXICILLIN-POT CLAVULANATE 875-125 MG PO TABS: 1.0000 | ORAL_TABLET | Freq: Two times a day (BID) | ORAL | 0 refills | Status: AC

## 2017-07-10 MED ORDER — FLUTICASONE PROPIONATE 50 MCG/ACT NA SUSP: 2.0000 | Freq: Every day | NASAL | 11 refills | Status: DC

## 2017-07-10 NOTE — Patient Instructions (Addendum)
Please stop by lab before you go  I would also like for you to sign up for an annual wellness visit with our nurse, Cassie, who specializes in the annual wellness exam. This is a free benefit under medicare that may help us find additional ways to help you. Some highlights are reviewing medications, lifestyle, and doing a dementia screen.

## 2017-07-10 NOTE — Assessment & Plan Note (Signed)
compliant with atorvastatin 40mg  and asa 81mg . Also on coreg and imdur- with angina in place- no angina. Most recent EF in 2016 was 45-50%. Saw Dr. Darl HouseholderKoneswaren 09/2016 and will see yearly.

## 2017-07-10 NOTE — Progress Notes (Signed)
Phone: [phone]  Subjective:  Patient presents today for their annual physical. Chief complaint-noted.   See problem oriented charting- ROS- full  review of systems was completed and negative except for: sinus congestion chronic, rare palpitations- states his entire life for both of these issuesg  The following were reviewed and entered/updated in epic: Past Medical History:  Diagnosis Date  . Allergy   . CAD (coronary artery disease)   . Cataract   . Depression   . Hyperlipidemia   . Hypertension   . Insomnia   . Sinus congestion    Patient Active Problem List   Diagnosis Date Noted  . CAD (coronary artery disease) 02/17/2007    Priority: High  . Binocular diplopia 12/31/2016    Priority: Medium  . Hyperlipidemia 02/17/2007    Priority: Medium  . Essential hypertension 02/17/2007    Priority: Medium  . Headache 05/24/2014    Priority: Low  . INSOMNIA, PERSISTENT 03/03/2007    Priority: Low  . Depression 03/03/2007    Priority: Low  . SYNDROME, CARPAL TUNNEL 02/17/2007    Priority: Low  . Fatty liver 02/17/2007    Priority: Low  . Chronic sinusitis 07/10/2017  . Right shoulder pain 09/02/2014   Past Surgical History:  Procedure Laterality Date  . APPENDECTOMY    . CATARACT EXTRACTION W/PHACO Right 07/08/2013   Procedure: CATARACT EXTRACTION PHACO AND INTRAOCULAR LENS PLACEMENT (IOC);  Surgeon: Gemma PayorKerry Hunt, MD;  Location: [facility];  Service: Ophthalmology;  Laterality: Right;  CDE 26.34  . CORONARY ANGIOPLASTY     stents x4  . undescended testicle surgery      Family History  Problem Relation Age of Onset  . Diabetes Father 4260  . Stroke Father        smoker  . Colon cancer Neg Hx   . Rectal cancer Neg Hx   . Stomach cancer Neg Hx     Medications- reviewed and updated Current Outpatient Medications  Medication Sig Dispense Refill  . aspirin 81 MG tablet Take 81 mg by mouth daily.      Marland Kitchen. atorvastatin (LIPITOR) 40 MG tablet TAKE ONE TABLET BY MOUTH  ONCE DAILY 90 tablet 3  . carvedilol (COREG) 12.5 MG tablet TAKE ONE TABLET BY MOUTH TWICE DAILY WITH A MEAL 180 tablet 3  . ibuprofen (ADVIL,MOTRIN) 200 MG tablet Take 200 mg by mouth every 6 (six) hours as needed for moderate pain. Reported on 10/03/2015    . isosorbide mononitrate (IMDUR) 60 MG 24 hr tablet TAKE ONE TABLET BY MOUTH ONCE DAILY 90 tablet 3   Allergies-reviewed and updated No Known Allergies  Social History   Socioeconomic History  . Marital status: Married    Spouse name: None  . Number of children: None  . Years of education: None  . Highest education level: None  Social Needs  . Financial resource strain: None  . Food insecurity - worry: None  . Food insecurity - inability: None  . Transportation needs - medical: None  . Transportation needs - non-medical: None  Occupational History  . Occupation: retired  Tobacco Use  . Smoking status: Former Smoker    Packs/day: 0.25    Years: 30.00    Pack years: 7.50    Types: Cigarettes    Last attempt to quit: 08/05/1990    Years since quitting: 26.9  . Smokeless tobacco: Never Used  Substance and Sexual Activity  . Alcohol use: Yes    Alcohol/week: 1.2 oz    Types: 1  Glasses of wine, 1 Cans of beer per week    Comment: occassional  . Drug use: No  . Sexual activity: Yes    Birth control/protection: None  Other Topics Concern  . None  Social History Narrative   Married 1964. 2 kids-son, daughter. 5 grandkids.       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      Hobbies: walk 8 miles a day, neighborhood handyman    Objective: BP 114/70 (BP Location: Left Arm, Patient Position: Sitting, Cuff Size: Large)   Pulse 66   Temp (!) 97.5 F (36.4 C) (Oral)   Ht 5' 8.5" (1.74 m)   Wt 160 lb 3.2 oz (72.7 kg)   SpO2 98%   BMI 24.00 kg/m  Gen: NAD, resting comfortably HEENT: Mucous membranes are moist. Oropharynx normal. Some sinus pressure- some yellow discharge in nares Neck: no thyromegaly CV: RRR no murmurs rubs or  gallops, 2+ PT pulses Lungs: CTAB no crackles, wheeze, rhonchi Abdomen: soft/nontender/nondistended/normal bowel sounds. No rebound or guarding.  Ext: no edema Skin: warm, dry Neuro: grossly normal, moves all extremities, PERRLA  Assessment/Plan:  76 y.o. male presenting for annual physical.  Health Maintenance counseling: 1. Anticipatory guidance: Patient counseled regarding regular dental exams -q6 months- but admits he is not going, eye exams -yearly advised, wearing seatbelts.  2. Risk factor reduction:  Advised patient of need for regular exercise and diet rich and fruits and vegetables to reduce risk of heart attack and stroke. Exercise-  still walking 70 miles most weeks. Diet-reasonable Wt Readings from Last 3 Encounters:  07/10/17 160 lb 3.2 oz (72.7 kg)  12/31/16 159 lb 9.6 oz (72.4 kg)  09/19/16 160 lb (72.6 kg)  3. Immunizations/screenings/ancillary studies- discussed shingrix availability issues and getting at pharmacy  Immunization History  Administered Date(s) Administered  . Influenza Whole 06/04/2007, 06/07/2009, 05/03/2010  . Influenza, High Dose Seasonal PF 05/10/2013, 05/25/2016  . Influenza-Unspecified 05/14/2014, 06/21/2015, 05/13/2017  . Pneumococcal Conjugate-13 06/21/2015  . Pneumococcal Polysaccharide-23 06/04/2007  . Tdap 06/11/2012  4. Prostate cancer screening-  would not advise screening based on age based screening recommendations. Didn't even get up last night to pee.  Lab Results  Component Value Date   PSA 0.58 06/03/2011   PSA 0.48 04/26/2010   PSA 0.51 05/26/2008   5. Colon cancer screening - 2015. Diverticulosis severe. Was told no repeat due to age.  6. Skin cancer screening- no dermatologist recently. advised regular sunscreen use. Denies worrisome, changing, or new skin lesions.   Status of chronic or acute concerns   Hyperlipidemia LDL goal 70 or less- he was as low as 73 last visit on atorvastatin 40mg . Discussed could consider  rosuvastatin 40mg - he had been moved to atorvastatin due to cost though. Patient would prefer not to increase statin unless LDL really up- we will use 80 or above only to increase statin strength  CAD (coronary artery disease)  compliant with atorvastatin 40mg  and asa 81mg . Also on coreg and imdur- with angina in place- no angina. Most recent EF in 2016 was 45-50%. Saw Dr. Darl Householder 09/2016 and will see yearly.   Essential hypertension  coreg 12.5mg  BID, imdur 60mg  controlling BP. Stopped amlodipine back in may and continues to do well   Binocular diplopia Binocular diplopia-[facility] records show 4th CN injury as cause. Apparently related to surgeries in .   Chronic sinusitis Chronic sinus issues- continues to have yellow/green discharge. Already on loratadine. Will add flonase - rare nosebleeds. And will do 7 days  antibiotics  Ibuprofen may be every 3-4 days for pressure. Did discuss some cardiac risk with this and kidney risk. Discussed could try tylenol instead.   Future Appointments  Date Time Provider Department Center  09/24/2017  8:20 AM Laqueta LindenKoneswaran, Suresh A, MD [facility]   Return in about 1 year (around 07/10/2018) for physical.  Orders Placed This Encounter  Procedures  . CBC    [place]  . Comprehensive metabolic panel    [place]  . LDL cholesterol, direct    [place]  . POCT Urinalysis Dipstick (Automated)    Standing Status:   Future    Standing Expiration Date:   08/10/2017    Meds ordered this encounter  Medications  . fluticasone (FLONASE) 50 MCG/ACT nasal spray    Sig: Place 2 sprays into both nostrils daily.    Dispense:  16 g    Refill:  11  . amoxicillin-clavulanate (AUGMENTIN) 875-125 MG tablet    Sig: Take 1 tablet by mouth 2 (two) times daily for 7 days.    Dispense:  14 tablet    Refill:  0   Return precautions advised.  Tana ConchStephen Hunter, MD

## 2017-07-10 NOTE — Assessment & Plan Note (Signed)
LDL goal 70 or less- he was as low as 73 last visit on atorvastatin 40mg . Discussed could consider rosuvastatin 40mg - he had been moved to atorvastatin due to cost though. Patient would prefer not to increase statin unless LDL really up- we will use 80 or above only to increase statin strength

## 2017-07-10 NOTE — Assessment & Plan Note (Signed)
Chronic sinus issues- continues to have yellow/green discharge. Already on loratadine. Will add flonase - rare nosebleeds. And will do 7 days antibiotics  Ibuprofen may be every 3-4 days for pressure. Did discuss some cardiac risk with this and kidney risk. Discussed could try tylenol instead.

## 2017-07-10 NOTE — Addendum Note (Signed)
Addended by: London SheerFRIZZELL, BAILEY T on: 07/10/2017 09:16 AM   Modules accepted: Orders

## 2017-07-10 NOTE — Assessment & Plan Note (Signed)
Binocular diplopia-[facility] records show 4th CN injury as cause. Apparently related to surgeries in .

## 2017-07-10 NOTE — Assessment & Plan Note (Signed)
coreg 12.5mg  BID, imdur 60mg  controlling BP. Stopped amlodipine back in may and continues to do well

## 2017-07-11 ENCOUNTER — Other Ambulatory Visit: Payer: Self-pay | Admitting: Family Medicine

## 2017-09-24 ENCOUNTER — Ambulatory Visit: Payer: Medicare Other | Admitting: Cardiovascular Disease

## 2017-09-24 ENCOUNTER — Encounter: Payer: Self-pay | Admitting: Cardiovascular Disease

## 2017-09-24 ENCOUNTER — Other Ambulatory Visit: Payer: Self-pay

## 2017-09-24 VITALS — BP 104/58 | HR 64 | Ht 68.5 in | Wt 161.8 lb

## 2017-09-24 DIAGNOSIS — I25118 Atherosclerotic heart disease of native coronary artery with other forms of angina pectoris: Secondary | ICD-10-CM

## 2017-09-24 DIAGNOSIS — I10 Essential (primary) hypertension: Secondary | ICD-10-CM | POA: Diagnosis not present

## 2017-09-24 DIAGNOSIS — Z955 Presence of coronary angioplasty implant and graft: Secondary | ICD-10-CM

## 2017-09-24 DIAGNOSIS — E782 Mixed hyperlipidemia: Secondary | ICD-10-CM | POA: Diagnosis not present

## 2017-09-24 NOTE — Progress Notes (Signed)
SUBJECTIVE: The patient returns for routine follow up. He has a history of coronary artery disease and reportedly had 4 stents placed, 2 roughly 20 years ago and another 2 in 2005. He also has a history of hypertension and hyperlipidemia.   Echocardiogram on 09/15/14 demonstrated low normal to mildly reduced left ventricular systolic function, EF 45-50%, mild diffuse hypokinesis, grade 1 diastolic dysfunction, elevated filling pressures, and mild aortic root dilatation.  He is an avid walker and does up to 13 miles daily.  ECG performed today which I personally reviewed demonstrated sinus rhythm with nonspecific T wave abnormalities and PVCs.  He walks 10.58 miles yesterday.  He denies chest pain, leg swelling, dizziness, shortness of breath.  He seldom has palpitations.    Review of Systems: As per "subjective", otherwise negative.  No Known Allergies  Current Outpatient Medications  Medication Sig Dispense Refill  . aspirin 81 MG tablet Take 81 mg by mouth daily.      Marland Kitchen. atorvastatin (LIPITOR) 40 MG tablet TAKE ONE TABLET BY MOUTH ONCE DAILY 90 tablet 3  . carvedilol (COREG) 12.5 MG tablet TAKE ONE TABLET BY MOUTH TWICE DAILY WITH FOOD 180 tablet 3  . fluticasone (FLONASE) 50 MCG/ACT nasal spray Place 2 sprays into both nostrils daily. 16 g 11  . isosorbide mononitrate (IMDUR) 60 MG 24 hr tablet TAKE ONE TABLET BY MOUTH ONCE DAILY 90 tablet 3   No current facility-administered medications for this visit.     Past Medical History:  Diagnosis Date  . Allergy   . CAD (coronary artery disease)   . Cataract   . Depression   . Hyperlipidemia   . Hypertension   . Insomnia   . Sinus congestion     Past Surgical History:  Procedure Laterality Date  . APPENDECTOMY    . CATARACT EXTRACTION W/PHACO Right 07/08/2013   Procedure: CATARACT EXTRACTION PHACO AND INTRAOCULAR LENS PLACEMENT (IOC);  Surgeon: Gemma PayorKerry Hunt, MD;  Location: [facility];  Service: Ophthalmology;  Laterality:  Right;  CDE 26.34  . CORONARY ANGIOPLASTY     stents x4  . undescended testicle surgery      Social History   Socioeconomic History  . Marital status: Married    Spouse name: Not on file  . Number of children: Not on file  . Years of education: Not on file  . Highest education level: Not on file  Social Needs  . Financial resource strain: Not on file  . Food insecurity - worry: Not on file  . Food insecurity - inability: Not on file  . Transportation needs - medical: Not on file  . Transportation needs - non-medical: Not on file  Occupational History  . Occupation: retired  Tobacco Use  . Smoking status: Former Smoker    Packs/day: 0.25    Years: 30.00    Pack years: 7.50    Types: Cigarettes    Last attempt to quit: 08/05/1990    Years since quitting: 27.1  . Smokeless tobacco: Never Used  Substance and Sexual Activity  . Alcohol use: Yes    Alcohol/week: 1.2 oz    Types: 1 Glasses of wine, 1 Cans of beer per week    Comment: occassional  . Drug use: No  . Sexual activity: Yes    Birth control/protection: None  Other Topics Concern  . Not on file  Social History Narrative   Married 1964. 2 kids-son, daughter. 5 grandkids.       Retired [place]  [employer]me[employer]ic      Hobbies: walk 8 miles a day, neighborhood handyman     Vitals:   09/24/17 0813  BP: (!) 104/58  Pulse: 64  SpO2: 99%  Weight: 161 lb 12.8 oz (73.4 kg)  Height: 5' 8.5" (1.74 m)    Wt Readings from Last 3 Encounters:  09/24/17 161 lb 12.8 oz (73.4 kg)  07/10/17 160 lb 3.2 oz (72.7 kg)  12/31/16 159 lb 9.6 oz (72.4 kg)     PHYSICAL EXAM General: NAD HEENT: Normal. Neck: No JVD, no thyromegaly. Lungs: Clear to auscultation bilaterally with normal respiratory effort. CV: Regular rate and rhythm, normal S1/S2, no S3/S4, no murmur. No pretibial or periankle edema.  No carotid bruit.   Abdomen: Soft, nontender, no distention.  Neurologic: Alert and oriented.  Psych: Normal affect. Skin:  Normal. Musculoskeletal: No gross deformities.    ECG: Most recent ECG reviewed.   Labs: Lab Results  Component Value Date/Time   K 4.9 07/10/2017 09:05 AM   BUN 15 07/10/2017 09:05 AM   CREATININE 1.03 07/10/2017 09:05 AM   ALT 25 07/10/2017 09:05 AM   TSH 0.83 09/26/2014 09:29 AM   HGB 13.6 07/10/2017 09:05 AM     Lipids: Lab Results  Component Value Date/Time   LDLCALC 73 12/31/2016 10:05 AM   LDLDIRECT 60.0 07/10/2017 09:05 AM   CHOL 125 12/31/2016 10:05 AM   TRIG 70.0 12/31/2016 10:05 AM   HDL 38.20 (L) 12/31/2016 10:05 AM       ASSESSMENT AND PLAN: 1.  Coronary disease with a history of multiple stents: Symptomatically stable.  Will continue aspirin, Lipitor, carvedilol, and Imdur.  2.  Chronic hypertension: Blood pressure is controlled.  No changes to therapy.  3.  Hyperlipidemia: LDL 60 on 07/10/17.  Continue Lipitor 40 mg.    Disposition: Follow up 1 year   Prentice Docker, M.D., F.A.C.C.

## 2017-09-24 NOTE — Patient Instructions (Signed)

## 2018-03-10 ENCOUNTER — Other Ambulatory Visit: Payer: Self-pay | Admitting: Family Medicine

## 2018-06-11 ENCOUNTER — Other Ambulatory Visit: Payer: Self-pay | Admitting: Family Medicine

## 2018-07-09 NOTE — Progress Notes (Signed)
Subjective:   Martin James is a 77 y.o. male who presents for Medicare Annual/Subsequent preventive examination.  Lives in single family home with wife.   Review of Systems:  No ROS.  Medicare Wellness Visit. Additional risk factors are reflected in the social history.  Sleeps 6-7 hrs/night. Wakes up feeling rested. No naps.   Cardiac Risk Factors include: advanced age (>35men, >46 women);dyslipidemia;hypertension;male gender     Objective:    Vitals: BP 117/76   Pulse 84   Resp 16   Ht 5' 8.5" (1.74 m)   Wt 161 lb (73 kg)   SpO2 97%   BMI 24.12 kg/m   Body mass index is 24.12 kg/m.  Advanced Directives 07/10/2018 07/06/2014 06/28/2013  Does Patient Have a Medical Advance Directive? No No Patient would not like information;Patient does not have advance directive  Would patient like information on creating a medical advance directive? Yes (MAU/Ambulatory/Procedural Areas - Information given) - -  Pre-existing out of facility DNR order (yellow form or pink MOST form) - - No  Per verbal order from Dr Durene Cal, advanced directives were discussed with patient. Patient states that he does not have any advanced directives but is interested in filling them out. I thoroughly explained the Advanced Directive packet with patient and answered all questions. This was a face to face conversation with the patient and a total time of 16 minutes was spent. Patient agreed to return document to office for scanning once completed.   Tobacco Social History   Tobacco Use  Smoking Status Former Smoker  . Packs/day: 0.25  . Years: 30.00  . Pack years: 7.50  . Types: Cigarettes  . Last attempt to quit: 08/05/1990  . Years since quitting: 27.9  Smokeless Tobacco Never Used     Counseling given: Not Answered  Past Medical History:  Diagnosis Date  . Allergy   . CAD (coronary artery disease)   . Cataract   . Depression   . Hyperlipidemia   . Hypertension   . Insomnia   . Sinus congestion      Past Surgical History:  Procedure Laterality Date  . APPENDECTOMY    . CATARACT EXTRACTION W/PHACO Right 07/08/2013   Procedure: CATARACT EXTRACTION PHACO AND INTRAOCULAR LENS PLACEMENT (IOC);  Surgeon: Gemma Payor, MD;  Location: [facility];  Service: Ophthalmology;  Laterality: Right;  CDE 26.34  . CORONARY ANGIOPLASTY     stents x4  . undescended testicle surgery     Family History  Problem Relation Age of Onset  . Diabetes Father 55  . Stroke Father        smoker  . Colon cancer Neg Hx   . Rectal cancer Neg Hx   . Stomach cancer Neg Hx    Social History   Socioeconomic History  . Marital status: Married    Spouse name: Not on file  . Number of children: Not on file  . Years of education: Not on file  . Highest education level: Not on file  Occupational History  . Occupation: retired  Engineer, production  . Financial resource strain: Not on file  . Food insecurity:    Worry: Not on file    Inability: Not on file  . Transportation needs:    Medical: Not on file    Non-medical: Not on file  Tobacco Use  . Smoking status: Former Smoker    Packs/day: 0.25    Years: 30.00    Pack years: 7.50    Types: Cigarettes  Last attempt to quit: 08/05/1990    Years since quitting: 27.9  . Smokeless tobacco: Never Used  Substance and Sexual Activity  . Alcohol use: Yes    Alcohol/week: 2.0 standard drinks    Types: 1 Glasses of wine, 1 Cans of beer per week    Comment: occassional  . Drug use: No  . Sexual activity: Yes    Birth control/protection: None  Lifestyle  . Physical activity:    Days per week: Not on file    Minutes per session: Not on file  . Stress: Not on file  Relationships  . Social connections:    Talks on phone: Not on file    Gets together: Not on file    Attends religious service: Not on file    Active member of club or organization: Not on file    Attends meetings of clubs or organizations: Not on file    Relationship status: Not on file  Other Topics  Concern  . Not on file  Social History Narrative   Married 1964. 2 kids-son, daughter. 5 grandkids.       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      Hobbies: walk 8 miles a day, neighborhood handyman    Outpatient Encounter Medications as of 07/10/2018  Medication Sig  . aspirin 81 MG tablet Take 81 mg by mouth daily.    Marland Kitchen atorvastatin (LIPITOR) 40 MG tablet TAKE 1 TABLET BY MOUTH ONCE DAILY  . carvedilol (COREG) 12.5 MG tablet Take 1 tablet (12.5 mg total) by mouth 2 (two) times daily with a meal.  . fluticasone (FLONASE) 50 MCG/ACT nasal spray Place 2 sprays into both nostrils daily.  . isosorbide mononitrate (IMDUR) 60 MG 24 hr tablet TAKE 1 TABLET BY MOUTH ONCE DAILY  . [DISCONTINUED] carvedilol (COREG) 12.5 MG tablet TAKE ONE TABLET BY MOUTH TWICE DAILY WITH FOOD  . [DISCONTINUED] fluticasone (FLONASE) 50 MCG/ACT nasal spray Place 2 sprays into both nostrils daily.   No facility-administered encounter medications on file as of 07/10/2018.     Activities of Daily Living In your present state of health, do you have any difficulty performing the following activities: 07/10/2018  Hearing? N  Vision? N  Difficulty concentrating or making decisions? N  Walking or climbing stairs? N  Dressing or bathing? N  Doing errands, shopping? N  Preparing Food and eating ? N  Using the Toilet? N  In the past six months, have you accidently leaked urine? N  Do you have problems with loss of bowel control? N  Managing your Medications? N  Managing your Finances? N  Housekeeping or managing your Housekeeping? N  Some recent data might be hidden    Patient Care Team: [name], MD as PCP - General (Family Medicine)   Assessment:   This is a routine wellness examination for Martin James.  Exercise Activities and Dietary recommendations Current Exercise Habits: Home exercise routine, Type of exercise: walking;strength training/weights, Time (Minutes): > 60, Frequency (Times/Week): >7, Weekly Exercise  (Minutes/Week): 0, Exercise limited by: None identified   Drinks 6 cups black coffee, 2-3 glasses lemonade, at least 6 glasses water/day.  Breakfast: toast Lunch: sandwich Dinner: Meat, vegetables Snacks: grapes, lettuce   Goals    . Maintain current health       Fall Risk Fall Risk  07/10/2018 07/10/2018 12/31/2016 10/03/2015 09/01/2014  Falls in the past year? 0 0 No No Yes  Number falls in past yr: - - - - 1  Injury with Fall? - - - -  No  Risk for fall due to : - - - - Other (Comment)   Depression Screen PHQ 2/9 Scores 07/10/2018 07/10/2018 12/31/2016 10/03/2015  PHQ - 2 Score 0 0 0 0  PHQ- 9 Score 0 - - -  PHQ2 and PHQ9 completed. No signs or symptoms of depression. Patient states he stays too busy. Lives with his wife of 55 years and walks 10 miles day. Spends time with nieghboors as well.   Cognitive Function Ad8 score reviewed for issues:  Issues making decisions:no  Less interest in hobbies / activities:no  Repeats questions, stories (family complaining):no  Trouble using ordinary gadgets (microwave, computer, phone):no  Forgets the month or year: no  Mismanaging finances: no  Remembering appts:no  Daily problems with thinking and/or memory:no Ad8 score is=0 Does at least two crossword puzzles daily. States he also reads all the time. States he read 48 books last year.          Immunization History  Administered Date(s) Administered  . Influenza Whole 06/04/2007, 06/07/2009, 05/03/2010  . Influenza, High Dose Seasonal PF 05/10/2013, 05/25/2016  . Influenza-Unspecified 05/14/2014, 06/21/2015, 05/13/2017, 04/30/2018  . Pneumococcal Conjugate-13 06/21/2015  . Pneumococcal Polysaccharide-23 06/04/2007  . Tdap 06/11/2012   Screening Tests Health Maintenance  Topic Date Due  . TETANUS/TDAP  06/11/2022  . INFLUENZA VACCINE  Completed  . PNA vac Low Risk Adult  Completed       Plan:   Follow up with PCP as directed.  I have personally reviewed and noted  the following in the patient's chart:   . Medical and social history . Use of alcohol, tobacco or illicit drugs  . Current medications and supplements . Functional ability and status . Nutritional status . Physical activity . Advanced directives . List of other physicians . Vitals . Screenings to include cognitive, depression, and falls . Referrals and appointments  In addition, I have reviewed and discussed with patient certain preventive protocols, quality metrics, and best practice recommendations. A written personalized care plan for preventive services as well as general preventive health recommendations were provided to patient.     Sherrye Payorassandra J Raynee Mccasland, RN  07/10/2018

## 2018-07-09 NOTE — Progress Notes (Signed)
PCP notes:   Health maintenance: Up to date. Patient is going to look into cost of Shingrix.   Abnormal screenings:  None.   Patient concerns: None.   Nurse concerns: None.   Next PCP appt: 07/13/2019

## 2018-07-10 ENCOUNTER — Ambulatory Visit (INDEPENDENT_AMBULATORY_CARE_PROVIDER_SITE_OTHER): Payer: Medicare Other | Admitting: *Deleted

## 2018-07-10 ENCOUNTER — Encounter: Payer: Self-pay | Admitting: Family Medicine

## 2018-07-10 ENCOUNTER — Ambulatory Visit (INDEPENDENT_AMBULATORY_CARE_PROVIDER_SITE_OTHER): Payer: Medicare Other | Admitting: Family Medicine

## 2018-07-10 VITALS — BP 117/76 | HR 84 | Resp 16 | Ht 68.5 in | Wt 161.0 lb

## 2018-07-10 VITALS — BP 116/76 | HR 84 | Temp 97.6°F | Ht 68.5 in | Wt 161.0 lb

## 2018-07-10 DIAGNOSIS — I10 Essential (primary) hypertension: Secondary | ICD-10-CM | POA: Diagnosis not present

## 2018-07-10 DIAGNOSIS — E782 Mixed hyperlipidemia: Secondary | ICD-10-CM

## 2018-07-10 DIAGNOSIS — Z0000 Encounter for general adult medical examination without abnormal findings: Secondary | ICD-10-CM

## 2018-07-10 DIAGNOSIS — Z1331 Encounter for screening for depression: Secondary | ICD-10-CM

## 2018-07-10 LAB — CBC
HEMATOCRIT: 41.9 % (ref 39.0–52.0)
Hemoglobin: 14.2 g/dL (ref 13.0–17.0)
MCHC: 33.9 g/dL (ref 30.0–36.0)
MCV: 96.6 fl (ref 78.0–100.0)
Platelets: 197 10*3/uL (ref 150.0–400.0)
RBC: 4.34 Mil/uL (ref 4.22–5.81)
RDW: 13.5 % (ref 11.5–15.5)
WBC: 5.3 10*3/uL (ref 4.0–10.5)

## 2018-07-10 LAB — LIPID PANEL
CHOLESTEROL: 110 mg/dL (ref 0–200)
HDL: 37.1 mg/dL — ABNORMAL LOW (ref >39.00)
LDL Cholesterol: 63 mg/dL (ref 0–99)
NonHDL: 73.26
Total CHOL/HDL Ratio: 3
Triglycerides: 50 mg/dL (ref 0.0–149.0)
VLDL: 10 mg/dL (ref 0.0–40.0)

## 2018-07-10 LAB — COMPREHENSIVE METABOLIC PANEL
ALBUMIN: 4.1 g/dL (ref 3.5–5.2)
ALK PHOS: 50 U/L (ref 39–117)
ALT: 46 U/L (ref 0–53)
AST: 57 U/L — AB (ref 0–37)
BILIRUBIN TOTAL: 0.9 mg/dL (ref 0.2–1.2)
BUN: 17 mg/dL (ref 6–23)
CO2: 27 mEq/L (ref 19–32)
CREATININE: 0.99 mg/dL (ref 0.40–1.50)
Calcium: 9.2 mg/dL (ref 8.4–10.5)
Chloride: 103 mEq/L (ref 96–112)
GFR: 77.58 mL/min (ref >60.00)
GLUCOSE: 107 mg/dL — AB (ref 70–99)
POTASSIUM: 4.5 meq/L (ref 3.5–5.1)
SODIUM: 137 meq/L (ref 135–145)
TOTAL PROTEIN: 6.7 g/dL (ref 6.0–8.3)

## 2018-07-10 MED ORDER — FLUTICASONE PROPIONATE 50 MCG/ACT NA SUSP: 2.0000 | Freq: Every day | NASAL | 11 refills | Status: DC

## 2018-07-10 MED ORDER — CARVEDILOL 12.5 MG PO TABS: 12.5000 mg | ORAL_TABLET | Freq: Two times a day (BID) | ORAL | 3 refills | Status: DC

## 2018-07-10 NOTE — Patient Instructions (Signed)
[name] , Thank you for taking time to come for your Medicare Wellness Visit. I appreciate your ongoing commitment to your health goals. Please review the following plan we discussed and let me know if I can assist you in the future.   These are the goals we discussed: Goals    . Maintain current health       This is a list of the screening recommended for you and due dates:  Health Maintenance  Topic Date Due  . Tetanus Vaccine  06/11/2022  . Flu Shot  Completed  . Pneumonia vaccines  Completed   Preventive Care for Adults  A healthy lifestyle and preventive care can promote health and wellness. Preventive health guidelines for adults include the following key practices.  . A routine yearly physical is a good way to check with your health care provider about your health and preventive screening. It is a chance to share any concerns and updates on your health and to receive a thorough exam.  . Visit your dentist for a routine exam and preventive care every 6 months. Brush your teeth twice a day and floss once a day. Good oral hygiene prevents tooth decay and gum disease.  . The frequency of eye exams is based on your age, health, family medical history, use  of contact lenses, and other factors. Follow your health care provider's recommendations for frequency of eye exams.  . Eat a healthy diet. Foods like vegetables, fruits, whole grains, low-fat dairy products, and lean protein foods contain the nutrients you need without too many calories. Decrease your intake of foods high in solid fats, added sugars, and salt. Eat the right amount of calories for you. Get information about a proper diet from your health care provider, if necessary.  . Regular physical exercise is one of the most important things you can do for your health. Most adults should get at least 150 minutes of moderate-intensity exercise (any activity that increases your heart rate and causes you to sweat) each week. In  addition, most adults need muscle-strengthening exercises on 2 or more days a week.  Silver Sneakers may be a benefit available to you. To determine eligibility, you may visit the website: www.silversneakers.com or contact program at 84854183731-217-183-1405 Mon-Fri between 8AM-8PM.   . Maintain a healthy weight. The body mass index (BMI) is a screening tool to identify possible weight problems. It provides an estimate of body fat based on height and weight. Your health care provider can find your BMI and can help you achieve or maintain a healthy weight.   For adults 20 years and older: ? A BMI below 18.5 is considered underweight. ? A BMI of 18.5 to 24.9 is normal. ? A BMI of 25 to 29.9 is considered overweight. ? A BMI of 30 and above is considered obese.   . Maintain normal blood lipids and cholesterol levels by exercising and minimizing your intake of saturated fat. Eat a balanced diet with plenty of fruit and vegetables. Blood tests for lipids and cholesterol should begin at age 78 and be repeated every 5 years. If your lipid or cholesterol levels are high, you are over 50, or you are at high risk for heart disease, you may need your cholesterol levels checked more frequently. Ongoing high lipid and cholesterol levels should be treated with medicines if diet and exercise are not working.  . If you smoke, find out from your health care provider how to quit. If you do not use  tobacco, please do not start.  . If you choose to drink alcohol, please do not consume more than 2 drinks per day. One drink is considered to be 12 ounces (355 mL) of beer, 5 ounces (148 mL) of wine, or 1.5 ounces (44 mL) of liquor.  . If you are 18-65 years old, ask your health care provider if you should take aspirin to prevent strokes.  . Use sunscreen. Apply sunscreen liberally and repeatedly throughout the day. You should seek shade when your shadow is shorter than you. Protect yourself by wearing long sleeves, pants, a  wide-brimmed hat, and sunglasses year round, whenever you are outdoors.  . Once a month, do a whole body skin exam, using a mirror to look at the skin on your back. Tell your health care provider of new moles, moles that have irregular borders, moles that are larger than a pencil eraser, or moles that have changed in shape or color.

## 2018-07-10 NOTE — Progress Notes (Signed)
I have reviewed and agree with note, evaluation, plan.   Parrish Bonn, MD  

## 2018-07-10 NOTE — Patient Instructions (Addendum)
Please check with your pharmacy to see if they have the shingrix vaccine. If they do- please get this immunization and update us by phone call or mychart with dates you receive the vaccine  I would encourage you to schedule a yearly follow up for the eyes with opthalmology not optometry and to get scheduled with a dentist as well  Please stop by lab before you go

## 2018-07-10 NOTE — Progress Notes (Signed)
Phone: [phone]  Subjective:  Patient presents today for their annual physical. Chief complaint-noted.   See problem oriented charting- ROS- full  review of systems was completed and negative except for: dental problems but wont go to dentist, diplopia still- wasn't helped by optho in past  The following were reviewed and entered/updated in epic: Past Medical History:  Diagnosis Date  . Allergy   . CAD (coronary artery disease)   . Cataract   . Depression   . Hyperlipidemia   . Hypertension   . Insomnia   . Sinus congestion    Patient Active Problem List   Diagnosis Date Noted  . CAD (coronary artery disease) 02/17/2007    Priority: High  . Binocular diplopia 12/31/2016    Priority: Medium  . Hyperlipidemia 02/17/2007    Priority: Medium  . Essential hypertension 02/17/2007    Priority: Medium  . Headache 05/24/2014    Priority: Low  . INSOMNIA, PERSISTENT 03/03/2007    Priority: Low  . Depression 03/03/2007    Priority: Low  . SYNDROME, CARPAL TUNNEL 02/17/2007    Priority: Low  . Fatty liver 02/17/2007    Priority: Low  . Chronic sinusitis 07/10/2017  . Right shoulder pain 09/02/2014   Past Surgical History:  Procedure Laterality Date  . APPENDECTOMY    . CATARACT EXTRACTION W/PHACO Right 07/08/2013   Procedure: CATARACT EXTRACTION PHACO AND INTRAOCULAR LENS PLACEMENT (IOC);  Surgeon: Gemma Payor, MD;  Location: [facility];  Service: Ophthalmology;  Laterality: Right;  CDE 26.34  . CORONARY ANGIOPLASTY     stents x4  . undescended testicle surgery      Family History  Problem Relation Age of Onset  . Diabetes Father 46  . Stroke Father        smoker  . Colon cancer Neg Hx   . Rectal cancer Neg Hx   . Stomach cancer Neg Hx     Medications- reviewed and updated Current Outpatient Medications  Medication Sig Dispense Refill  . aspirin 81 MG tablet Take 81 mg by mouth daily.      Marland Kitchen atorvastatin (LIPITOR) 40 MG tablet TAKE 1 TABLET BY MOUTH ONCE DAILY  90 tablet 3  . carvedilol (COREG) 12.5 MG tablet Take 1 tablet (12.5 mg total) by mouth 2 (two) times daily with a meal. 180 tablet 3  . fluticasone (FLONASE) 50 MCG/ACT nasal spray Place 2 sprays into both nostrils daily. 16 g 11  . isosorbide mononitrate (IMDUR) 60 MG 24 hr tablet TAKE 1 TABLET BY MOUTH ONCE DAILY 90 tablet 3   No current facility-administered medications for this visit.     Allergies-reviewed and updated No Known Allergies  Social History   Social History Narrative   Married 1964. 2 kids-son, daughter. 5 grandkids.       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      Hobbies: walk 8 miles a day, neighborhood handyman    Objective: BP 116/76 (BP Location: Left Arm, Patient Position: Sitting, Cuff Size: Large)   Pulse 84   Temp 97.6 F (36.4 C) (Oral)   Ht 5' 8.5" (1.74 m)   Wt 161 lb (73 kg)   SpO2 97%   BMI 24.12 kg/m  Gen: NAD, resting comfortably, Appears younger than stated age HEENT: Mucous membranes are moist. Oropharynx normal Neck: no thyromegaly CV: RRR no murmurs rubs or gallops Lungs: CTAB no crackles, wheeze, rhonchi Abdomen: soft/nontender/nondistended/normal bowel sounds. No rebound or guarding.  Ext: no edema Skin: warm, dry Neuro: grossly normal, moves all  extremities, PERRLA  Assessment/Plan:  77 y.o. male presenting for annual physical.  Health Maintenance counseling: 1. Anticipatory guidance: Patient counseled regarding regular dental exams - has not yest made it to dentist- encouraged again tdoay, eye exams - yearly encouraged but he declines,  avoiding smoking and second hand smoke, limiting alcohol to 2 beverages per day - occasional beer.   2. Risk factor reduction:  Advised patient of need for regular exercise and diet rich and fruits and vegetables to reduce risk of heart attack and stroke. Exercise- 70 miles a week still waking. Diet- reasonable diet/food choices- healthy weight.  Wt Readings from Last 3 Encounters:  07/10/18 161 lb (73  kg)  09/24/17 161 lb 12.8 oz (73.4 kg)  07/10/17 160 lb 3.2 oz (72.7 kg)  3. Immunizations/screenings/ancillary studies-discussed getting shingles immunization at the pharmacy Immunization History  Administered Date(s) Administered  . Influenza Whole 06/04/2007, 06/07/2009, 05/03/2010  . Influenza, High Dose Seasonal PF 05/10/2013, 05/25/2016  . Influenza-Unspecified 05/14/2014, 06/21/2015, 05/13/2017, 04/30/2018  . Pneumococcal Conjugate-13 06/21/2015  . Pneumococcal Polysaccharide-23 06/04/2007  . Tdap 06/11/2012  4. Prostate cancer screening-passed age based screenings.  Denies significant change in urinary symptoms  Lab Results  Component Value Date   PSA 0.58 06/03/2011   PSA 0.48 04/26/2010   PSA 0.51 05/26/2008   5. Colon cancer screening -2015-only findings were severe diverticulosis-was told no repeat due to age. No blood in stool or melena.   6. Skin cancer screening- no dermatologist. advised regular sunscreen use. Denies worrisome, changing, or new skin lesions.  7.  Former smoker- 7.5 pack years and quit in 1992. Get UA only.   Status of chronic or acute concerns   Hyperlipidemia-mild LDL elevations with goal 70 or less on atorvastatin 40 mg-patient does not want to increase strength of statin unless LDL significantly increased-will use cut off of 80- fortunately last LDL was only 60  Coronary artery disease- compliant with statin and aspirin.  He is also on carvedilol and Imdur- no angina while on Imdur.  Follows with cardiology yearly- each february  Hypertension- controlled on Coreg 12.5 mg twice daily, Imdur 60 mg.  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 Wants to have flonase on hand if needed  Future Appointments  Date Time Provider Department Center  07/10/2018 10:00 AM [facility]  09/16/2018  8:20 AM Laqueta LindenKoneswaran, Suresh A, MD [facility]   Lab/Order associations: FASTING  Preventative health care  Mixed hyperlipidemia - Plan: Urinalysis, CBC, Lipid panel, Comprehensive metabolic panel  Essential hypertension - Plan: Urinalysis, CBC, Lipid panel, Comprehensive metabolic panel  Meds ordered this encounter  Medications  . carvedilol (COREG) 12.5 MG tablet    Sig: Take 1 tablet (12.5 mg total) by mouth 2 (two) times daily with a meal.    Dispense:  180 tablet    Refill:  3  . fluticasone (FLONASE) 50 MCG/ACT nasal spray    Sig: Place 2 sprays into both nostrils daily.    Dispense:  16 g    Refill:  11   Return precautions advised.  Tana ConchStephen Johnella Crumm, MD

## 2018-07-21 ENCOUNTER — Other Ambulatory Visit: Payer: Self-pay

## 2018-07-21 DIAGNOSIS — R7989 Other specified abnormal findings of blood chemistry: Secondary | ICD-10-CM

## 2018-07-21 DIAGNOSIS — R945 Abnormal results of liver function studies: Principal | ICD-10-CM

## 2018-08-18 ENCOUNTER — Encounter: Payer: Self-pay | Admitting: Family Medicine

## 2018-09-16 ENCOUNTER — Ambulatory Visit: Payer: Medicare Other | Admitting: Cardiovascular Disease

## 2018-09-16 ENCOUNTER — Encounter: Payer: Self-pay | Admitting: Cardiovascular Disease

## 2018-09-16 VITALS — BP 107/73 | HR 76 | Ht 68.5 in | Wt 157.2 lb

## 2018-09-16 DIAGNOSIS — I4891 Unspecified atrial fibrillation: Secondary | ICD-10-CM | POA: Diagnosis not present

## 2018-09-16 DIAGNOSIS — I25118 Atherosclerotic heart disease of native coronary artery with other forms of angina pectoris: Secondary | ICD-10-CM | POA: Diagnosis not present

## 2018-09-16 DIAGNOSIS — E785 Hyperlipidemia, unspecified: Secondary | ICD-10-CM

## 2018-09-16 DIAGNOSIS — I10 Essential (primary) hypertension: Secondary | ICD-10-CM

## 2018-09-16 DIAGNOSIS — Z955 Presence of coronary angioplasty implant and graft: Secondary | ICD-10-CM | POA: Diagnosis not present

## 2018-09-16 DIAGNOSIS — Z7189 Other specified counseling: Secondary | ICD-10-CM

## 2018-09-16 MED ORDER — APIXABAN 5 MG PO TABS: 5.0000 mg | ORAL_TABLET | Freq: Two times a day (BID) | ORAL | 6 refills | Status: DC

## 2018-09-16 MED ORDER — APIXABAN 5 MG PO TABS: 5.0000 mg | ORAL_TABLET | Freq: Two times a day (BID) | ORAL | 0 refills | Status: DC

## 2018-09-16 NOTE — Progress Notes (Signed)
SUBJECTIVE: The patient returns for routine follow up. He has a history of coronary artery disease and reportedly had 4 stents placed, 2 roughly 20 years ago and another 2 in 2005. He also has a history of hypertension and hyperlipidemia.   Echocardiogram on 09/15/14 demonstrated low normal to mildly reduced left ventricular systolic function, EF 45-50%, mild diffuse hypokinesis, grade 1 diastolic dysfunction, elevated filling pressures, and mild aortic root dilatation.  He is an avid walker and does up to 10 miles daily.  ECG performed in the office today which I ordered and personally interpreted demonstrates atrial fibrillation with PVCs and nonspecific T wave abnormalities.  He has lost 5 brothers in the last 4-1/2 years.  He is noticed his resting heart rate has been more elevated and attributed this to grief.  He denies chest pain and shortness of breath.  His resting heart rate used to be in the 48-50 bpm range but it has been more elevated over the last 4 months.  CBC and renal function were normal in December 2019.      Review of Systems: As per "subjective", otherwise negative.  No Known Allergies  Current Outpatient Medications  Medication Sig Dispense Refill  . aspirin 81 MG tablet Take 81 mg by mouth daily.      Marland Kitchen. atorvastatin (LIPITOR) 40 MG tablet TAKE 1 TABLET BY MOUTH ONCE DAILY 90 tablet 3  . carvedilol (COREG) 12.5 MG tablet Take 1 tablet (12.5 mg total) by mouth 2 (two) times daily with a meal. 180 tablet 3  . fluticasone (FLONASE) 50 MCG/ACT nasal spray Place 2 sprays into both nostrils daily. 16 g 11  . isosorbide mononitrate (IMDUR) 60 MG 24 hr tablet TAKE 1 TABLET BY MOUTH ONCE DAILY 90 tablet 3   No current facility-administered medications for this visit.     Past Medical History:  Diagnosis Date  . Allergy   . CAD (coronary artery disease)   . Cataract   . Depression   . Hyperlipidemia   . Hypertension   . Insomnia   . Sinus congestion       Past Surgical History:  Procedure Laterality Date  . APPENDECTOMY    . CATARACT EXTRACTION W/PHACO Right 07/08/2013   Procedure: CATARACT EXTRACTION PHACO AND INTRAOCULAR LENS PLACEMENT (IOC);  Surgeon: Gemma PayorKerry Hunt, MD;  Location: [facility];  Service: Ophthalmology;  Laterality: Right;  CDE 26.34  . CORONARY ANGIOPLASTY     stents x4  . undescended testicle surgery      Social History   Socioeconomic History  . Marital status: Married    Spouse name: Not on file  . Number of children: Not on file  . Years of education: Not on file  . Highest education level: Not on file  Occupational History  . Occupation: retired  Engineer, productionocial Needs  . Financial resource strain: Not on file  . Food insecurity:    Worry: Not on file    Inability: Not on file  . Transportation needs:    Medical: Not on file    Non-medical: Not on file  Tobacco Use  . Smoking status: Former Smoker    Packs/day: 0.25    Years: 30.00    Pack years: 7.50    Types: Cigarettes    Last attempt to quit: 08/05/1990    Years since quitting: 28.1  . Smokeless tobacco: Never Used  Substance and Sexual Activity  . Alcohol use: Yes    Alcohol/week: 2.0 standard drinks  Types: 1 Glasses of wine, 1 Cans of beer per week    Comment: occassional  . Drug use: No  . Sexual activity: Yes    Birth control/protection: None  Lifestyle  . Physical activity:    Days per week: Not on file    Minutes per session: Not on file  . Stress: Not on file  Relationships  . Social connections:    Talks on phone: Not on file    Gets together: Not on file    Attends religious service: Not on file    Active member of club or organization: Not on file    Attends meetings of clubs or organizations: Not on file    Relationship status: Not on file  . Intimate partner violence:    Fear of current or ex partner: Not on file    Emotionally abused: Not on file    Physically abused: Not on file    Forced sexual activity: Not on file  Other  Topics Concern  . Not on file  Social History Narrative   Married 1964. 2 kids-son, daughter. 5 grandkids.       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      Hobbies: walk 8 miles a day, neighborhood handyman     Vitals:   09/16/18 0802  BP: 107/73  Pulse: 76  SpO2: 99%  Weight: 157 lb 3.2 oz (71.3 kg)  Height: 5' 8.5" (1.74 m)    Wt Readings from Last 3 Encounters:  09/16/18 157 lb 3.2 oz (71.3 kg)  07/10/18 161 lb (73 kg)  07/10/18 161 lb (73 kg)     PHYSICAL EXAM General: NAD HEENT: Normal. Neck: No JVD, no thyromegaly. Lungs: Clear to auscultation bilaterally with normal respiratory effort. CV: Regular rate and irregular rhythm, normal S1/S2, no S3, no murmur. No pretibial or periankle edema.  No carotid bruit.   Abdomen: Soft, nontender, no distention.  Neurologic: Alert and oriented.  Psych: Normal affect. Skin: Normal. Musculoskeletal: No gross deformities.    ECG: Reviewed above under Subjective   Labs: Lab Results  Component Value Date/Time   K 4.5 07/10/2018 08:55 AM   BUN 17 07/10/2018 08:55 AM   CREATININE 0.99 07/10/2018 08:55 AM   ALT 46 07/10/2018 08:55 AM   TSH 0.83 09/26/2014 09:29 AM   HGB 14.2 07/10/2018 08:55 AM     Lipids: Lab Results  Component Value Date/Time   LDLCALC 63 07/10/2018 08:55 AM   LDLDIRECT 60.0 07/10/2017 09:05 AM   CHOL 110 07/10/2018 08:55 AM   TRIG 50.0 07/10/2018 08:55 AM   HDL 37.10 (L) 07/10/2018 08:55 AM       ASSESSMENT AND PLAN: 1.  Coronary disease with a history of multiple stents: Symptomatically stable.    I will continue Lipitor, carvedilol, and Imdur.  As I am starting Eliquis, I will discontinue aspirin (see discussion below).  2.  Chronic hypertension: Blood pressure is controlled.  No changes to therapy.  3.  Hyperlipidemia: Full lipid panel reviewed above.  LDL 63 on 07/10/2018.  Continue atorvastatin.  4.  New onset atrial fibrillation: While he has noticed his resting heart rate has been more  elevated over the last 4 months, he seldom has palpitations.  Heart rate is controlled on carvedilol.  He requires systemic anticoagulation.  We had a long discussion regarding this.  I will start Eliquis 5 mg twice daily.  I will discontinue aspirin. I will order a 2-D echocardiogram with Doppler to evaluate cardiac structure, function, and regional  wall motion.   Disposition: Follow up 6 months  A high level of decision making was required for increased medical complexities.    Prentice DockerSuresh Taletha Twiford, M.D., F.A.C.C.

## 2018-09-16 NOTE — Patient Instructions (Signed)
Medication Instructions:   Stop Aspirin.   Begin Eliquis 5mg  twice a day.  Continue all other medications.    Labwork:   Testing/Procedures:  Your physician has requested that you have an echocardiogram. Echocardiography is a painless test that uses sound waves to create images of your heart. It provides your doctor with information about the size and shape of your heart and how well your heart's chambers and valves are working. This procedure takes approximately one hour. There are no restrictions for this procedure.  Office will contact with results via phone or letter.    Follow-Up:  Your physician wants you to follow up in: 6 months.  You will receive a reminder letter in the mail one-two months in advance.  If you don't receive a letter, please call our office to schedule the follow up appointment   1 most new Eliquis follow up with anticoagulation clinic.    Any Other Special Instructions Will Be Listed Below (If Applicable).  If you need a refill on your cardiac medications before your next appointment, please call your pharmacy.

## 2018-09-17 ENCOUNTER — Telehealth: Payer: Self-pay | Admitting: *Deleted

## 2018-09-17 NOTE — Telephone Encounter (Signed)
Wife Britta Mccreedy) notified & verbalized understanding.  New coumadin appointment scheduled for Tuesday, 09/29/18 at 10:30 am.  He will remain on Eliquis till then.

## 2018-09-17 NOTE — Telephone Encounter (Signed)
That would be fine 

## 2018-09-17 NOTE — Telephone Encounter (Signed)
Patient questioning if can do the Coumadin instead of the Eliquis.  Stated that he has been talking to his insurance person and his co-pay will be 47.00 / month.  Stated that this medication will put him in the coverage gap in 7 months & he can not afford that.

## 2018-09-21 ENCOUNTER — Other Ambulatory Visit (INDEPENDENT_AMBULATORY_CARE_PROVIDER_SITE_OTHER): Payer: Medicare Other

## 2018-09-21 DIAGNOSIS — R945 Abnormal results of liver function studies: Secondary | ICD-10-CM | POA: Diagnosis not present

## 2018-09-21 DIAGNOSIS — R7989 Other specified abnormal findings of blood chemistry: Secondary | ICD-10-CM

## 2018-09-21 LAB — HEPATIC FUNCTION PANEL
ALT: 20 U/L (ref 0–53)
AST: 22 U/L (ref 0–37)
Albumin: 4 g/dL (ref 3.5–5.2)
Alkaline Phosphatase: 64 U/L (ref 39–117)
BILIRUBIN TOTAL: 0.8 mg/dL (ref 0.2–1.2)
Bilirubin, Direct: 0.2 mg/dL (ref 0.0–0.3)
Total Protein: 7 g/dL (ref 6.0–8.3)

## 2018-09-29 ENCOUNTER — Ambulatory Visit (INDEPENDENT_AMBULATORY_CARE_PROVIDER_SITE_OTHER): Payer: Medicare Other | Admitting: *Deleted

## 2018-09-29 ENCOUNTER — Telehealth: Payer: Self-pay | Admitting: Cardiovascular Disease

## 2018-09-29 DIAGNOSIS — I480 Paroxysmal atrial fibrillation: Secondary | ICD-10-CM

## 2018-09-29 DIAGNOSIS — Z5181 Encounter for therapeutic drug level monitoring: Secondary | ICD-10-CM

## 2018-09-29 DIAGNOSIS — I4891 Unspecified atrial fibrillation: Secondary | ICD-10-CM | POA: Insufficient documentation

## 2018-09-29 LAB — POCT INR: INR: 1.2 — AB (ref 2.0–3.0)

## 2018-09-29 MED ORDER — WARFARIN SODIUM 5 MG PO TABS: 5.0000 mg | ORAL_TABLET | Freq: Every day | ORAL | 1 refills | Status: DC

## 2018-09-29 NOTE — Telephone Encounter (Signed)
Patient has question about when start date is for coumadin

## 2018-09-29 NOTE — Telephone Encounter (Signed)
Coumadin start date discussed with pt and wife.  See anticoag note from today.

## 2018-09-29 NOTE — Patient Instructions (Signed)
Pt in today to transition from Eliquis to Warfarin due to cost.  He has 2 weeks of Eliquis left. He will start Warfarin 5mg  daily on 3/11 and overlap with Eliquis x 3 days (3/11, 3/12, 3/13).  He will continue coumadin 5mg  daily on 3/14 and restart ASA 81mg  daily Recheck INR in 1 week

## 2018-10-01 ENCOUNTER — Ambulatory Visit (INDEPENDENT_AMBULATORY_CARE_PROVIDER_SITE_OTHER): Payer: Medicare Other

## 2018-10-01 DIAGNOSIS — I4891 Unspecified atrial fibrillation: Secondary | ICD-10-CM | POA: Diagnosis not present

## 2018-10-05 ENCOUNTER — Telehealth: Payer: Self-pay | Admitting: *Deleted

## 2018-10-05 ENCOUNTER — Encounter: Payer: Self-pay | Admitting: *Deleted

## 2018-10-05 DIAGNOSIS — R931 Abnormal findings on diagnostic imaging of heart and coronary circulation: Secondary | ICD-10-CM

## 2018-10-05 MED ORDER — SACUBITRIL-VALSARTAN 24-26 MG PO TABS: 1.0000 | ORAL_TABLET | Freq: Two times a day (BID) | ORAL | 0 refills | Status: DC

## 2018-10-05 MED ORDER — SACUBITRIL-VALSARTAN 24-26 MG PO TABS: 1.0000 | ORAL_TABLET | Freq: Two times a day (BID) | ORAL | 6 refills | Status: DC

## 2018-10-05 NOTE — Telephone Encounter (Signed)
Notes recorded by Lesle Chris, LPN on 10/06/9177 at 6:26 PM EST Patient notified. Copy to pmd. He agrees to doing lexiscan. Instructions given for test. New medication sent to pharmacy. ------  Notes recorded by Lesle Chris, LPN on 1/50/5697 at 5:53 PM EST Left message to return call.  ------  Notes recorded by Laqueta Linden, MD on 10/02/2018 at 3:17 PM EST Cardiac function is reduced. Obtain Lexiscan Myoview to assess for ischemia. Start Entresto 24-26 mg twice daily. Aortic valve shows mild to moderate narrowing with mild valve leakage. There is also moderate mitral valve leakage.

## 2018-10-08 ENCOUNTER — Ambulatory Visit: Payer: Self-pay | Admitting: Family Medicine

## 2018-10-08 ENCOUNTER — Telehealth: Payer: Self-pay | Admitting: Cardiovascular Disease

## 2018-10-08 NOTE — Telephone Encounter (Signed)
Per wife, patient started entresto Monday night 10/06/2018 and has been feeling dizzy in which today is worse. HR 102 & BP 119/79. No chest pain, sob, headache, fever or chills. Reports nausea but no vomiting. Wife thinks he has a sinus infection d/t hoarseness, coughing, sneezing and runny nose. Patient is eating and drinking okay. Encouraged wife to contact PCP with symptoms that are described, keep patient well hydrated and no driving. Advised that if symptoms get worse, to go to the ED for an evaluation. Verbalized understanding of plan.

## 2018-10-08 NOTE — Telephone Encounter (Signed)
Patient's wife called to tell us about her husband not feeling well since starting sacubitril-valsartan (ENTRESTO) 24-26 MG   Dizzy and feels that he was going to pass out.  Pulse Rate 102 -most recent one taken  Pulse rate will go up and down and they are concerned with it.Marland Kitchen

## 2018-10-08 NOTE — Telephone Encounter (Signed)
Wife called in and then put [place] on the phone.    He was diagnosed with new onset A. Fib. By the cardiologist 2 weeks ago and started on 2 new medications.   See triage notes. He then told me he has been dizzy when he sits for a while.   He has been having these dizzy spells since he started the medications.  He did mention that he has chronic ear and sinus problems.   Both of his ears are hurting and he is very congested in his sinuses.   "I'm full of snot"! He feels most of the dizziness is from his ears as this is not a new problem for him.  Dr. Parke Poisson was booked until the end of the month so I got him an appt with Jarold Motto, PA, RD, LDN for 10/09/2018 at 11:20.   Reason for Disposition . [1] MODERATE dizziness (e.g., interferes with normal activities) AND [2] has been evaluated by physician for this  Answer Assessment - Initial Assessment Questions 1. DESCRIPTION: "Describe your dizziness."     Wife calling in.   The heart dr. Mora Appl him on new medication 2 weeks ago.  Eliquis was started.   Entresto started Tuesday night.  Since then he has been dizzy spells.   Feels like passing out.   He has earache in both ears for ages.    Sinus drainage.    2. LIGHTHEADED: "Do you feel lightheaded?" (e.g., somewhat faint, woozy, weak upon standing)     Now I feel good.   My heart is racing.  I'm not dizzy now.   He called the cardiologist before calling here.   The cardiologist is going to talk with Dr. Parke Poisson.    My ears also cause this. 3. VERTIGO: "Do you feel like either you or the room is spinning or tilting?" (i.e. vertigo)     No vertigo.    My ears cause a lot of these symptoms.   I also have A. Fib.  No shortness of breath or chest pain. 4. SEVERITY: "How bad is it?"  "Do you feel like you are going to faint?" "Can you stand and walk?"   - MILD - walking normally   - MODERATE - interferes with normal activities (e.g., work, school)    - SEVERE - unable to stand, requires support to  walk, feels like passing out now.      I'm full on snot!!     I feel good walking around.    When I sit down is when I get dizzy.  Does not happen every time.   5. ONSET:  "When did the dizziness begin?"     My ears are a constant problem.   They pop and crack all the time.   I've been sinus congested for over a month. 6. AGGRAVATING FACTORS: "Does anything make it worse?" (e.g., standing, change in head position)     I've have sinus trouble for a very long time.  Sometimes Dr. Durene Cal gives me an antibiotics sometimes. 7. HEART RATE: "Can you tell me your heart rate?" "How many beats in 15 seconds?"  (Note: not all patients can do this)       I have A. Fib.   8. CAUSE: "What do you think is causing the dizziness?"     My ears and sinuses. 9. RECURRENT SYMPTOM: "Have you had dizziness before?" If so, ask: "When was the last time?" "What happened that time?"  Yes 10. OTHER SYMPTOMS: "Do you have any other symptoms?" (e.g., fever, chest pain, vomiting, diarrhea, bleeding)       No fever.  I've checked it and it's normal. 11. PREGNANCY: "Is there any chance you are pregnant?" "When was your last menstrual period?"       N/A  Protocols used: DIZZINESS [facility]

## 2018-10-09 ENCOUNTER — Telehealth: Payer: Self-pay | Admitting: Cardiovascular Disease

## 2018-10-09 ENCOUNTER — Telehealth: Payer: Self-pay | Admitting: *Deleted

## 2018-10-09 ENCOUNTER — Ambulatory Visit (INDEPENDENT_AMBULATORY_CARE_PROVIDER_SITE_OTHER): Payer: Medicare Other | Admitting: Physician Assistant

## 2018-10-09 ENCOUNTER — Encounter: Payer: Self-pay | Admitting: Physician Assistant

## 2018-10-09 VITALS — BP 102/78 | HR 91 | Temp 97.7°F | Ht 68.5 in | Wt 155.0 lb

## 2018-10-09 DIAGNOSIS — J324 Chronic pansinusitis: Secondary | ICD-10-CM | POA: Diagnosis not present

## 2018-10-09 DIAGNOSIS — R42 Dizziness and giddiness: Secondary | ICD-10-CM | POA: Diagnosis not present

## 2018-10-09 LAB — BASIC METABOLIC PANEL
BUN: 13 mg/dL (ref 6–23)
CHLORIDE: 102 meq/L (ref 96–112)
CO2: 28 mEq/L (ref 19–32)
Calcium: 9 mg/dL (ref 8.4–10.5)
Creatinine, Ser: 1.04 mg/dL (ref 0.40–1.50)
GFR: 68.92 mL/min (ref >60.00)
Glucose, Bld: 111 mg/dL — ABNORMAL HIGH (ref 70–99)
POTASSIUM: 4.6 meq/L (ref 3.5–5.1)
Sodium: 135 mEq/L (ref 135–145)

## 2018-10-09 MED ORDER — CARVEDILOL 6.25 MG PO TABS: 6.2500 mg | ORAL_TABLET | Freq: Two times a day (BID) | ORAL | 0 refills | Status: DC

## 2018-10-09 MED ORDER — DOXYCYCLINE HYCLATE 100 MG PO TABS: 100.0000 mg | ORAL_TABLET | Freq: Two times a day (BID) | ORAL | 0 refills | Status: DC

## 2018-10-09 NOTE — Telephone Encounter (Signed)
Updated medication list

## 2018-10-09 NOTE — Telephone Encounter (Signed)
Patient called stating that he would like to speak with someone in regards to his recent echo. States that he is just overwhelmed with his testing and medications.

## 2018-10-09 NOTE — Telephone Encounter (Signed)
Pt did not have questions regarding echo - wanted to f/u on previous phone note today and confirm that he should be taking carvedilol 6.25 mg bid and continue entresto - also wanted to make sure he didn't need an appt prior to his 3/12 stress test - pt aware that we would call him with results of stress test and make determination on f/u at that time - appreciative of explanation

## 2018-10-09 NOTE — Telephone Encounter (Signed)
Pre-cert Verification for the following procedure   lexiscan scheduled for 10/15/2018 at [facility]

## 2018-10-09 NOTE — Telephone Encounter (Signed)
[facility] called and wanted pt seen today or Monday for side effects on Entresto - offered 3/25 appt with Randall An, PA but asking that Dr Purvis Sheffield call provider @ [phone] to discuss

## 2018-10-09 NOTE — Progress Notes (Signed)
Martin James is a 78 y.o. male here for a new problem.  History of Present Illness:   Chief Complaint  Patient presents with  . Ear Pain    x 64month,  both ears    HPI   Patient is here today with wife.  Briefly, patient is here for 2 separate issues.  Ear pain and sinus congestion --patient reports that both ears have some intermittent pain, and they feel like they have fluid in them.  He has a history of sinus congestion.  He often requires antibiotics for his sinus infections.  He is having a slight dry cough.  He denies fever, chills, sore throat.  He denies any history of asthma or pneumonia.  He has a great appetite.  Intermittent lightheadedness --patient reports that for the past 3 days he has had intermittent lightheadedness.  It happens when he is sitting typically.  The symptoms coincide with starting Sherryll Burger, which his cardiologist started 3 days ago for reduced cardiac function.  Patient is taking this medication as prescribed.  He has not been checking his blood pressure regularly at home when having symptoms.  He denies any sensation of the room is spinning or as he is spinning.  He denies any chest pain, shortness of breath, swelling in lower legs.  He contacted his cardiology office yesterday to ask if this was a result of the [facility], however he was deferred to PCP -- which is why he is here today. His cardiologist is Dr. Prentice Docker at [facility].   He was recently diagnosed with atrial fibrillation, and was started on Eliquis.  He states that Eliquis is too expensive for him and he is transitioning to Coumadin in a week or so.  He is compliant with his Eliquis.   BP Readings from Last 3 Encounters:  10/09/18 102/78  09/16/18 107/73  07/10/18 117/76     Past Medical History:  Diagnosis Date  . Allergy   . CAD (coronary artery disease)   . Cataract   . Depression   . Hyperlipidemia   . Hypertension   . Insomnia   . Sinus congestion      Social  History   Socioeconomic History  . Marital status: Married    Spouse name: Not on file  . Number of children: Not on file  . Years of education: Not on file  . Highest education level: Not on file  Occupational History  . Occupation: retired  Engineer, production  . Financial resource strain: Not on file  . Food insecurity:    Worry: Not on file    Inability: Not on file  . Transportation needs:    Medical: Not on file    Non-medical: Not on file  Tobacco Use  . Smoking status: Former Smoker    Packs/day: 0.25    Years: 30.00    Pack years: 7.50    Types: Cigarettes    Last attempt to quit: 08/05/1990    Years since quitting: 28.1  . Smokeless tobacco: Never Used  Substance and Sexual Activity  . Alcohol use: Yes    Alcohol/week: 2.0 standard drinks    Types: 1 Glasses of wine, 1 Cans of beer per week    Comment: occassional  . Drug use: No  . Sexual activity: Yes    Birth control/protection: None  Lifestyle  . Physical activity:    Days per week: Not on file    Minutes per session: Not on file  .  Stress: Not on file  Relationships  . Social connections:    Talks on phone: Not on file    Gets together: Not on file    Attends religious service: Not on file    Active member of club or organization: Not on file    Attends meetings of clubs or organizations: Not on file    Relationship status: Not on file  . Intimate partner violence:    Fear of current or ex partner: Not on file    Emotionally abused: Not on file    Physically abused: Not on file    Forced sexual activity: Not on file  Other Topics Concern  . Not on file  Social History Narrative   Married 1964. 2 kids-son, daughter. 5 grandkids.       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 APPENDECTOMY    . CATARACT EXTRACTION W/PHACO Right 07/08/2013   Procedure: CATARACT EXTRACTION PHACO AND INTRAOCULAR LENS  PLACEMENT (IOC);  Surgeon: Gemma Payor, MD;  Location: [facility];  Service: Ophthalmology;  Laterality: Right;  CDE 26.34  . CORONARY ANGIOPLASTY     stents x4  . undescended testicle surgery      Family History  Problem Relation Age of Onset  . Diabetes Father 26  . Stroke Father        smoker  . Colon cancer Neg Hx   . Rectal cancer Neg Hx   . Stomach cancer Neg Hx     No Known Allergies  Current Medications:   Current Outpatient Medications:  .  atorvastatin (LIPITOR) 40 MG tablet, TAKE 1 TABLET BY MOUTH ONCE DAILY, Disp: 90 tablet, Rfl: 3 .  Eliquis DVT/PE Starter Pack (ELIQUIS STARTER PACK) 5 MG TABS, Take by mouth. Take as directed on package: start with two-5mg  tablets twice daily for 7 days. On day 8, switch to one-5mg  tablet twice daily., Disp: , Rfl:  .  fluticasone (FLONASE) 50 MCG/ACT nasal spray, Place 2 sprays into both nostrils daily., Disp: 16 g, Rfl: 11 .  isosorbide mononitrate (IMDUR) 60 MG 24 hr tablet, TAKE 1 TABLET BY MOUTH ONCE DAILY, Disp: 90 tablet, Rfl: 3 .  sacubitril-valsartan (ENTRESTO) 24-26 MG, Take 1 tablet by mouth 2 (two) times daily., Disp: 60 tablet, Rfl: 6 .  carvedilol (COREG) 6.25 MG tablet, Take 1 tablet (6.25 mg total) by mouth 2 (two) times daily with a meal., Disp: 60 tablet, Rfl: 0 .  doxycycline (VIBRA-TABS) 100 MG tablet, Take 1 tablet (100 mg total) by mouth 2 (two) times daily., Disp: 20 tablet, Rfl: 0 .  sacubitril-valsartan (ENTRESTO) 24-26 MG, Take 1 tablet by mouth 2 (two) times daily., Disp: 60 tablet, Rfl: 0 .  warfarin (COUMADIN) 5 MG tablet, Take 1 tablet (5 mg total) by mouth daily. (Patient not taking: Reported on 10/09/2018), Disp: 30 tablet, Rfl: 1   Review of Systems:   Review of Systems  Constitutional: Negative for chills, fever, malaise/fatigue and weight loss.  HENT: Positive for congestion and sinus pain. Negative for hearing loss and tinnitus.   Eyes: Negative for blurred vision.  Respiratory: Positive for cough.  Negative for sputum production and shortness of breath.   Cardiovascular: Negative for chest pain, orthopnea, claudication and leg swelling.  Gastrointestinal: Negative for heartburn, nausea and vomiting.  Neurological: Negative for dizziness, tingling and headaches.    Vitals:   Vitals:  10/09/18 1104 10/09/18 1133  BP: 98/80 102/78  Pulse: 91   Temp: 97.7 F (36.5 C)   TempSrc: Oral   SpO2: 97%   Weight: 155 lb (70.3 kg)   Height: 5' 8.5" (1.74 m)      Body mass index is 23.22 kg/m.  Physical Exam:   Physical Exam Vitals signs and nursing note reviewed.  Constitutional:      General: He is not in acute distress.    Appearance: He is well-developed. He is not ill-appearing or toxic-appearing.  HENT:     Head: Normocephalic and atraumatic.     Right Ear: Tympanic membrane, ear canal and external ear normal. Tympanic membrane is not erythematous, retracted or bulging.     Left Ear: Tympanic membrane, ear canal and external ear normal. Tympanic membrane is not erythematous, retracted or bulging.     Nose: Mucosal edema, congestion and rhinorrhea present.     Right Sinus: Frontal sinus tenderness present. No maxillary sinus tenderness.     Left Sinus: Frontal sinus tenderness present. No maxillary sinus tenderness.     Mouth/Throat:     Lips: Pink.     Mouth: Mucous membranes are moist.     Pharynx: Uvula midline. Posterior oropharyngeal erythema present.  Eyes:     General: Lids are normal.     Conjunctiva/sclera: Conjunctivae normal.  Neck:     Trachea: Trachea normal.  Cardiovascular:     Rate and Rhythm: Normal rate. Rhythm irregularly irregular.     Heart sounds: Normal heart sounds, S1 normal and S2 normal.  Pulmonary:     Effort: Pulmonary effort is normal.     Breath sounds: Normal breath sounds. No decreased breath sounds, wheezing, rhonchi or rales.  Lymphadenopathy:     Cervical: No cervical adenopathy.  Skin:    General: Skin is warm and dry.   Neurological:     Mental Status: He is alert.  Psychiatric:        Speech: Speech normal.        Behavior: Behavior normal. Behavior is cooperative.     Assessment and Plan:   [place] was seen today for ear pain.  Diagnoses and all orders for this visit:  Lightheadedness I had my staff call patient's cardiologist office to try to secure an appointment soon to follow-up on lightheadedness, and to evaluate if he is having symptoms from [facility].  His blood pressure on initial read was 98/80, and then recheck was 102/78.  He is currently asymptomatic, and I recommended that he check his blood pressure when he has his lightheaded symptoms at home.  I think his blood pressure might be intermittently getting too low.  Unfortunately we were unable to get patient in with his cardiologist due to scheduling for the next 3 weeks.  I called and personally spoke with patient's cardiologist, Dr. Prentice Docker, and discussed patient's care today.  After review of patient's history and current complaints in the office, as well as vitals, I was advised to decrease patient's Coreg from 12.5 mg twice daily to 6.25 mg twice daily.  I have also checked a BMP today.  I advised patient to call and try to attempt to get an appointment with cardiology soon to follow-up on this.  I have also offered to see patient within a week if he is unable to get with cardiology.  If any worsening symptoms, patient was advised to go to the emergency room.  I also told him that the cardiologist said that  Entresto symptoms typically last the first 5 to 7 days, however he should start to feel improved after that.  Patient and wife verbalized understanding. -     Basic metabolic panel  Chronic pansinusitis No red flags on exam.  Will initiate doxycycline per orders. Discussed taking medications as prescribed. Reviewed return precautions including worsening fever, SOB, worsening cough or other concerns. Push fluids and rest. I recommend  that patient follow-up if symptoms worsen or persist despite treatment x 7-10 days, sooner if needed.  Other orders -     carvedilol (COREG) 6.25 MG tablet; Take 1 tablet (6.25 mg total) by mouth 2 (two) times daily with a meal. -     doxycycline (VIBRA-TABS) 100 MG tablet; Take 1 tablet (100 mg total) by mouth 2 (two) times daily.    . Reviewed expectations re: course of current medical issues. . Discussed self-management of symptoms. . Outlined signs and symptoms indicating need for more acute intervention. . Patient verbalized understanding and all questions were answered. . See orders for this visit as documented in the electronic medical record. . Patient received an After-Visit Summary.  I spent 40 minutes with this patient, greater than 50% was face-to-face time counseling regarding the above diagnoses.  I also spent a significant amount of time reviewing patient's chart, prior cardiology records, and also speaking with patient's cardiologist via telephone.   Jarold Motto, PA-C

## 2018-10-09 NOTE — Patient Instructions (Addendum)
It was great to see you!  1. STOP coreg 12.5 mg twice daily 2. START coreg 6.25 mg twice daily 3. START doxycycline for your sinus infection 4. Make an appointment with me or Dr. Durene Cal in 1 WEEK 5. Call cardiology and make a follow-up appointment -- if for some reason you are able to be seen within 1 week, you can cancel your follow-up appointment with me or Dr. Durene Cal  Use medication as prescribed: doxycycline  Push fluids and get plenty of rest. Please return if you are not improving as expected, or if you have high fevers (>101.5) or difficulty swallowing or worsening productive cough.  Call clinic with questions.  I hope you start feeling better soon!

## 2018-10-09 NOTE — Telephone Encounter (Signed)
I spoke with Jarold Motto PA-C.  I instructed her to reduce carvedilol to 6.25 mg twice daily and give Entresto some time to stabilize.  I also recommended checking a basic metabolic panel.

## 2018-10-12 ENCOUNTER — Telehealth: Payer: Self-pay

## 2018-10-12 NOTE — Telephone Encounter (Signed)
Noted and added

## 2018-10-12 NOTE — Telephone Encounter (Signed)
Patient wanted to have put on his medication list that he was given Doxycycline today for a sinus infection.

## 2018-10-15 ENCOUNTER — Ambulatory Visit (HOSPITAL_COMMUNITY)
Admission: RE | Admit: 2018-10-15 | Discharge: 2018-10-15 | Disposition: A | Discharge status: Home / Self Care | Payer: Medicare Other | Source: Ambulatory Visit | Attending: Cardiovascular Disease | Admitting: Cardiovascular Disease

## 2018-10-15 ENCOUNTER — Encounter (HOSPITAL_COMMUNITY)
Admission: RE | Admit: 2018-10-15 | Discharge: 2018-10-15 | Disposition: A | Discharge status: Home / Self Care | Payer: Medicare Other | Source: Ambulatory Visit | Attending: Cardiovascular Disease | Admitting: Cardiovascular Disease

## 2018-10-15 ENCOUNTER — Other Ambulatory Visit: Payer: Self-pay

## 2018-10-15 ENCOUNTER — Encounter (HOSPITAL_COMMUNITY): Payer: Self-pay

## 2018-10-15 ENCOUNTER — Telehealth: Payer: Self-pay | Admitting: Student

## 2018-10-15 DIAGNOSIS — R9439 Abnormal result of other cardiovascular function study: Secondary | ICD-10-CM | POA: Diagnosis not present

## 2018-10-15 DIAGNOSIS — R931 Abnormal findings on diagnostic imaging of heart and coronary circulation: Secondary | ICD-10-CM | POA: Diagnosis not present

## 2018-10-15 DIAGNOSIS — I517 Cardiomegaly: Secondary | ICD-10-CM | POA: Diagnosis not present

## 2018-10-15 LAB — NM MYOCAR MULTI W/SPECT W/WALL MOTION / EF
LV dias vol: 101 mL (ref 62–150)
LV sys vol: 84 mL
Peak HR: 160 {beats}/min
RATE: 0.43
Rest HR: 100 {beats}/min
SDS: 2
SRS: 21
SSS: 23
TID: 0.9

## 2018-10-15 MED ORDER — SODIUM CHLORIDE 0.9% FLUSH
INTRAVENOUS | Status: AC
  Administered 2018-10-15: 10 mL via INTRAVENOUS
  Filled 2018-10-15: qty 10

## 2018-10-15 MED ORDER — TECHNETIUM TC 99M TETROFOSMIN IV KIT
30.0000 | PACK | Freq: Once | INTRAVENOUS | Status: AC | PRN
  Administered 2018-10-15: 30 via INTRAVENOUS

## 2018-10-15 MED ORDER — REGADENOSON 0.4 MG/5ML IV SOLN
INTRAVENOUS | Status: AC
  Administered 2018-10-15: 0.4 mg via INTRAVENOUS
  Filled 2018-10-15: qty 5

## 2018-10-15 MED ORDER — TECHNETIUM TC 99M TETROFOSMIN IV KIT
10.0000 | PACK | Freq: Once | INTRAVENOUS | Status: AC | PRN
  Administered 2018-10-15: 10.5 via INTRAVENOUS

## 2018-10-15 MED ORDER — METOPROLOL SUCCINATE ER 50 MG PO TB24: 50.0000 mg | ORAL_TABLET | Freq: Every day | ORAL | 1 refills | Status: DC

## 2018-10-15 NOTE — Telephone Encounter (Signed)
Pt voiced understanding on medications - says he will pick up Toprol XL today and start today. Very concerned about stress test results - says he wants a better explanation of results and if he should see Dr Purvis Sheffield prior to August.

## 2018-10-15 NOTE — Telephone Encounter (Signed)
That would be fine. He can follow up with me in the next few months. If I don't have any availability, he could see an APP.

## 2018-10-15 NOTE — Telephone Encounter (Signed)
Pt voiced understanding of stress test results and appt made for May with Dr Harden Mo

## 2018-10-15 NOTE — Telephone Encounter (Signed)
    I met the patient today at the time of his stress test and he expressed multiple concerns. Heart rate was elevated in the 110's prior to initiating testing and peaked into the 150's. He reported having not taken his Coreg this morning but says his heart rate has been elevated in the 90's to 120's at rest when checked at home. Also says he has been having dizziness and fatigue with SBP dropping into the 80's at times.  By review of his medications, Coreg was recently reduced to 6.25 mg twice daily and he was started on Entresto 24-26 mg twice daily due to cardiomyopathy. Given his elevated HR not only during testing which is sometimes expected with Lexiscan but also at home and episodes of hypotension, may need to further titrate Coreg back to 12.'5mg'$  BID and hold Entresto for now due to hypotension or switch Coreg to Toprol-XL in the near future so this does not influence his BP as much. He has his medications with him and will take Coreg now. I told him to hold Entresto until he hears from our office. Dr. Bronson Ing, since I have never met the patient in clinic, I wanted to route to you for any suggestions you might have in regards to this?   Thanks,  Erma Heritage, PA-C 10/15/2018, 10:50 AM Pager: [phone]

## 2018-10-15 NOTE — Telephone Encounter (Signed)
Let's switch carvedilol to Toprol-XL 50 mg daily.  I will stop Entresto.  He can increase carvedilol back to 12.5 mg twice daily until he is able to start Toprol-XL.

## 2018-10-16 ENCOUNTER — Encounter: Payer: Self-pay | Admitting: Physician Assistant

## 2018-10-16 ENCOUNTER — Ambulatory Visit (INDEPENDENT_AMBULATORY_CARE_PROVIDER_SITE_OTHER): Payer: Medicare Other | Admitting: Physician Assistant

## 2018-10-16 ENCOUNTER — Other Ambulatory Visit: Payer: Self-pay

## 2018-10-16 VITALS — BP 110/80 | HR 87 | Temp 97.5°F | Ht 68.5 in | Wt 153.0 lb

## 2018-10-16 DIAGNOSIS — R42 Dizziness and giddiness: Secondary | ICD-10-CM | POA: Diagnosis not present

## 2018-10-16 DIAGNOSIS — R7309 Other abnormal glucose: Secondary | ICD-10-CM

## 2018-10-16 LAB — GLUCOSE, POCT (MANUAL RESULT ENTRY): POC Glucose: 5.2 mg/dl — AB (ref 70–99)

## 2018-10-16 NOTE — Patient Instructions (Signed)
It was great to see you!  You are doing very well!!!!  Take care,  Jarold Motto PA-C

## 2018-10-16 NOTE — Progress Notes (Signed)
Martin James is a 78 y.o. male here for a new problem.   History of Present Illness:   Chief Complaint  Patient presents with  . Follow-up    HPI   Using flonase, finishing up the doxycycline -- has about 3 days left. Still has a little bit of ear fullness.  He had a stress test yesterday and his  entresto was discontinued. He is feeling much better. No more dizziness or lightheadedness  He is switching to coumadin tomorrow. First INR check is early next week.  BP Readings from Last 3 Encounters:  10/16/18 110/80  10/09/18 102/78  09/16/18 107/73        Past Medical History:  Diagnosis Date  . Allergy   . CAD (coronary artery disease)   . Cataract   . Depression   . Hyperlipidemia   . Hypertension   . Insomnia   . Sinus congestion      Social History   Socioeconomic History  . Marital status: Married    Spouse name: Not on file  . Number of children: Not on file  . Years of education: Not on file  . Highest education level: Not on file  Occupational History  . Occupation: retired  Engineer, production  . Financial resource strain: Not on file  . Food insecurity:    Worry: Not on file    Inability: Not on file  . Transportation needs:    Medical: Not on file    Non-medical: Not on file  Tobacco Use  . Smoking status: Former Smoker    Packs/day: 0.25    Years: 30.00    Pack years: 7.50    Types: Cigarettes    Last attempt to quit: 08/05/1990    Years since quitting: 28.2  . Smokeless tobacco: Never Used  Substance and Sexual Activity  . Alcohol use: Yes    Alcohol/week: 2.0 standard drinks    Types: 1 Glasses of wine, 1 Cans of beer per week    Comment: occassional  . Drug use: No  . Sexual activity: Yes    Birth control/protection: None  Lifestyle  . Physical activity:    Days per week: Not on file    Minutes per session: Not on file  . Stress: Not on file  Relationships  . Social connections:    Talks on phone: Not on file    Gets together: Not  on file    Attends religious service: Not on file    Active member of club or organization: Not on file    Attends meetings of clubs or organizations: Not on file    Relationship status: Not on file  . Intimate partner violence:    Fear of current or ex partner: Not on file    Emotionally abused: Not on file    Physically abused: Not on file    Forced sexual activity: Not on file  Other Topics Concern  . Not on file  Social History Narrative   Married 1964. 2 kids-son, daughter. 5 grandkids.       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 APPENDECTOMY    . CATARACT EXTRACTION W/PHACO Right 07/08/2013   Procedure: CATARACT EXTRACTION PHACO AND INTRAOCULAR LENS PLACEMENT (IOC);  Surgeon: Gemma Payor, MD;  Location: [facility];  Service: Ophthalmology;  Laterality: Right;  CDE 26.34  . CORONARY ANGIOPLASTY  stents x4  . undescended testicle surgery      Family History  Problem Relation Age of Onset  . Diabetes Father 28  . Stroke Father        smoker  . Colon cancer Neg Hx   . Rectal cancer Neg Hx   . Stomach cancer Neg Hx     No Known Allergies  Current Medications:   Current Outpatient Medications:  .  atorvastatin (LIPITOR) 40 MG tablet, TAKE 1 TABLET BY MOUTH ONCE DAILY, Disp: 90 tablet, Rfl: 3 .  doxycycline (VIBRA-TABS) 100 MG tablet, Take 1 tablet (100 mg total) by mouth 2 (two) times daily., Disp: 20 tablet, Rfl: 0 .  DOXYCYCLINE PO, Take by mouth., Disp: , Rfl:  .  Eliquis DVT/PE Starter Pack (ELIQUIS STARTER PACK) 5 MG TABS, Take by mouth. Take as directed on package: start with two-5mg  tablets twice daily for 7 days. On day 8, switch to one-5mg  tablet twice daily., Disp: , Rfl:  .  fluticasone (FLONASE) 50 MCG/ACT nasal spray, Place 2 sprays into both nostrils daily., Disp: 16 g, Rfl: 11 .  isosorbide mononitrate (IMDUR) 60 MG 24 hr tablet, TAKE 1 TABLET BY MOUTH  ONCE DAILY, Disp: 90 tablet, Rfl: 3 .  metoprolol succinate (TOPROL-XL) 50 MG 24 hr tablet, Take 1 tablet (50 mg total) by mouth daily. Take with or immediately following a meal., Disp: 90 tablet, Rfl: 1 .  warfarin (COUMADIN) 5 MG tablet, Take 1 tablet (5 mg total) by mouth daily., Disp: 30 tablet, Rfl: 1   Review of Systems:   Review of Systems  Constitutional: Negative for chills, fever, malaise/fatigue and weight loss.  Respiratory: Negative for shortness of breath.   Cardiovascular: Negative for chest pain, orthopnea, claudication and leg swelling.  Gastrointestinal: Negative for heartburn, nausea and vomiting.  Neurological: Negative for dizziness, tingling and headaches.    Vitals:   Vitals:   10/16/18 0847  BP: 110/80  Pulse: 87  Temp: (!) 97.5 F (36.4 C)  TempSrc: Oral  SpO2: 98%  Weight: 153 lb (69.4 kg)  Height: 5' 8.5" (1.74 m)     Body mass index is 22.93 kg/m.  Physical Exam:   Physical Exam Vitals signs and nursing note reviewed.  Constitutional:      General: He is not in acute distress.    Appearance: He is well-developed. He is not ill-appearing or toxic-appearing.  Cardiovascular:     Rate and Rhythm: Normal rate and regular rhythm.     Pulses: Normal pulses.     Heart sounds: Normal heart sounds, S1 normal and S2 normal.     Comments: No LE edema Pulmonary:     Effort: Pulmonary effort is normal.     Breath sounds: Normal breath sounds.  Skin:    General: Skin is warm and dry.  Neurological:     Mental Status: He is alert.     GCS: GCS eye subscore is 4. GCS verbal subscore is 5. GCS motor subscore is 6.  Psychiatric:        Speech: Speech normal.        Behavior: Behavior normal. Behavior is cooperative.     Results for orders placed or performed in visit on 10/16/18  POCT glucose (manual entry)  Result Value Ref Range   POC Glucose 5.2 (A) 70 - 99 mg/dl    Assessment and Plan:   Martin James was seen today for follow-up.  Diagnoses and  all orders for this visit:  Lightheadedness; Elevated  glucose Overall doing well. We did check his A1c in the office today due to elevated glucose on last draw but it was normal at 5.2. Continue to follow-up with cardiology.  -     POCT glucose (manual entry)   . Reviewed expectations re: course of current medical issues. . Discussed self-management of symptoms. . Outlined signs and symptoms indicating need for more acute intervention. . Patient verbalized understanding and all questions were answered. . See orders for this visit as documented in the electronic medical record. . Patient received an After-Visit Summary.   Jarold Motto, PA-C

## 2018-10-19 ENCOUNTER — Telehealth: Payer: Self-pay | Admitting: *Deleted

## 2018-10-19 ENCOUNTER — Telehealth: Payer: Self-pay | Admitting: Cardiovascular Disease

## 2018-10-19 NOTE — Telephone Encounter (Signed)
Patient c/o of having continued elevated heart rates.  Consistently running 100-110 on average.  He is no longer taking Coreg or Entresto.  Is taking the Toprol XL 50mg  daily.  No c/o chest pain or SOB.  Does c/o of no energy though.  BP running around 123/73.

## 2018-10-19 NOTE — Telephone Encounter (Signed)
Notes recorded by Lesle Chris, LPN on 10/26/4008 at 4:57 PM EDT Patient notified. Copy to pmd. Follow up scheduled for 12/08/2018.   ------  Notes recorded by Lesle Chris, LPN on 2/72/5366 at 4:54 PM EDT Left message to return call.  ------  Notes recorded by Laqueta Linden, MD on 10/15/2018 at 12:39 PM EDT Large areas of myocardial scar due to prior infarction. No new blockages.

## 2018-10-19 NOTE — Telephone Encounter (Signed)
Patient is returning a call.  Please call Barbara;s cell phone #

## 2018-10-20 ENCOUNTER — Other Ambulatory Visit: Payer: Self-pay

## 2018-10-20 ENCOUNTER — Ambulatory Visit (INDEPENDENT_AMBULATORY_CARE_PROVIDER_SITE_OTHER): Payer: Medicare Other | Admitting: *Deleted

## 2018-10-20 DIAGNOSIS — Z5181 Encounter for therapeutic drug level monitoring: Secondary | ICD-10-CM | POA: Diagnosis not present

## 2018-10-20 DIAGNOSIS — I480 Paroxysmal atrial fibrillation: Secondary | ICD-10-CM

## 2018-10-20 LAB — POCT INR: INR: 3.7 — AB (ref 2.0–3.0)

## 2018-10-20 MED ORDER — METOPROLOL SUCCINATE ER 50 MG PO TB24: ORAL_TABLET | ORAL | Status: DC

## 2018-10-20 NOTE — Patient Instructions (Signed)
Hold coumadin tonight then decrease dose to 1 tablet daily except 1/2 tablet on Mondays and Thursdays Recheck INR in 1 week

## 2018-10-20 NOTE — Telephone Encounter (Signed)
Increase Toprol XL to 75 mg daily

## 2018-10-20 NOTE — Telephone Encounter (Signed)
Wife Britta Mccreedy) notified & verbalized understanding.  They will use his current supply for now to see if this dose gets heart rate under control.  He will let us know when need new rx.  If not, he will call us back.

## 2018-10-26 ENCOUNTER — Telehealth: Payer: Self-pay | Admitting: Pharmacist

## 2018-10-26 NOTE — Telephone Encounter (Signed)
1. Do you currently have a fever? No 2. Have you recently travelled on a cruise, internationally, or to [place], [place], [place], [place], [place], or [place], [place] ([employer]) ? No 3. Have you been in contact with someone that is currently pending confirmation of Covid19 testing or has been confirmed to have the Covid19 virus?  No Are you currently experiencing fatigue or cough? No  Pt. Advised that we are restricting visitors at this time and anyone present in the vehicle should meet the above criteria as well. Advised that visit will be at curbside for finger stick ONLY and will receive call with instructions.  

## 2018-10-27 ENCOUNTER — Ambulatory Visit (INDEPENDENT_AMBULATORY_CARE_PROVIDER_SITE_OTHER): Payer: Medicare Other | Admitting: *Deleted

## 2018-10-27 DIAGNOSIS — Z5181 Encounter for therapeutic drug level monitoring: Secondary | ICD-10-CM | POA: Diagnosis not present

## 2018-10-27 DIAGNOSIS — I480 Paroxysmal atrial fibrillation: Secondary | ICD-10-CM | POA: Diagnosis not present

## 2018-10-27 LAB — POCT INR: INR: 3.5 — AB (ref 2.0–3.0)

## 2018-10-27 NOTE — Patient Instructions (Signed)
Hold coumadin tonight then decrease dose to 1 tablet daily except 1/2 tablet on Mondays, Wednesdays and Fridays Increase greens Recheck INR in 2 weeks

## 2018-11-09 ENCOUNTER — Telehealth: Payer: Self-pay | Admitting: Cardiovascular Disease

## 2018-11-09 NOTE — Telephone Encounter (Signed)
° ° ° °  COVID-19 Pre-Screening Questions: ° °• Do you currently have a fever? No °•  °• Have you recently travelled on a cruise, internationally, or to [place], [place], [place], [place], [place], or [place], [place] ([employer]) ? No °•  °• Have you been in contact with someone that is currently pending confirmation of Covid19 testing or has been confirmed to have the Covid19 virus? No °•  °• Are you currently experiencing fatigue or cough? No  ° ° °   ° ° ° ° ° °

## 2018-11-10 ENCOUNTER — Ambulatory Visit (INDEPENDENT_AMBULATORY_CARE_PROVIDER_SITE_OTHER): Payer: Medicare Other | Admitting: *Deleted

## 2018-11-10 ENCOUNTER — Other Ambulatory Visit: Payer: Self-pay

## 2018-11-10 DIAGNOSIS — Z5181 Encounter for therapeutic drug level monitoring: Secondary | ICD-10-CM | POA: Diagnosis not present

## 2018-11-10 DIAGNOSIS — I480 Paroxysmal atrial fibrillation: Secondary | ICD-10-CM

## 2018-11-10 LAB — POCT INR: INR: 2.6 (ref 2.0–3.0)

## 2018-11-10 NOTE — Patient Instructions (Signed)
Continue coumadin 1 tablet daily except 1/2 tablet on Mondays, Wednesdays and Fridays Continue greens Recheck INR in 2 weeks

## 2018-11-24 ENCOUNTER — Other Ambulatory Visit: Payer: Self-pay

## 2018-11-24 ENCOUNTER — Ambulatory Visit (INDEPENDENT_AMBULATORY_CARE_PROVIDER_SITE_OTHER): Payer: Medicare Other | Admitting: *Deleted

## 2018-11-24 DIAGNOSIS — Z5181 Encounter for therapeutic drug level monitoring: Secondary | ICD-10-CM | POA: Diagnosis not present

## 2018-11-24 DIAGNOSIS — I480 Paroxysmal atrial fibrillation: Secondary | ICD-10-CM | POA: Diagnosis not present

## 2018-11-24 LAB — POCT INR: INR: 2.4 (ref 2.0–3.0)

## 2018-11-24 NOTE — Patient Instructions (Signed)
Continue coumadin 1 tablet daily except 1/2 tablet on Mondays, Wednesdays and Fridays Continue greens Recheck INR in 3 weeks

## 2018-11-30 ENCOUNTER — Telehealth: Payer: Self-pay | Admitting: Cardiovascular Disease

## 2018-11-30 MED ORDER — METOPROLOL SUCCINATE ER 50 MG PO TB24: ORAL_TABLET | ORAL | 3 refills | Status: DC

## 2018-11-30 NOTE — Telephone Encounter (Signed)
Needing refill on his metoprolol succinate (TOPROL-XL) 50 MG 24 hr tablet [[id number]]  And will need the new Rx sent in.   Call was dropped

## 2018-12-03 ENCOUNTER — Telehealth: Payer: Self-pay | Admitting: *Deleted

## 2018-12-03 NOTE — Telephone Encounter (Signed)
Patient verbally consented for telehealth visits with [facility] and understands that his insurance company will be billed for the encounter.   Aware to have vitals available 

## 2018-12-08 ENCOUNTER — Telehealth (INDEPENDENT_AMBULATORY_CARE_PROVIDER_SITE_OTHER): Payer: Medicare Other | Admitting: Cardiovascular Disease

## 2018-12-08 ENCOUNTER — Encounter: Payer: Self-pay | Admitting: Cardiovascular Disease

## 2018-12-08 VITALS — BP 126/80 | HR 83 | Temp 97.2°F | Ht 68.5 in | Wt 155.0 lb

## 2018-12-08 DIAGNOSIS — I4891 Unspecified atrial fibrillation: Secondary | ICD-10-CM

## 2018-12-08 DIAGNOSIS — Z5181 Encounter for therapeutic drug level monitoring: Secondary | ICD-10-CM

## 2018-12-08 DIAGNOSIS — I429 Cardiomyopathy, unspecified: Secondary | ICD-10-CM

## 2018-12-08 DIAGNOSIS — E785 Hyperlipidemia, unspecified: Secondary | ICD-10-CM

## 2018-12-08 DIAGNOSIS — I10 Essential (primary) hypertension: Secondary | ICD-10-CM

## 2018-12-08 DIAGNOSIS — I25118 Atherosclerotic heart disease of native coronary artery with other forms of angina pectoris: Secondary | ICD-10-CM | POA: Diagnosis not present

## 2018-12-08 DIAGNOSIS — I350 Nonrheumatic aortic (valve) stenosis: Secondary | ICD-10-CM

## 2018-12-08 DIAGNOSIS — Z955 Presence of coronary angioplasty implant and graft: Secondary | ICD-10-CM

## 2018-12-08 DIAGNOSIS — I5022 Chronic systolic (congestive) heart failure: Secondary | ICD-10-CM

## 2018-12-08 NOTE — Progress Notes (Signed)
Virtual Visit via Telephone Note   This visit type was conducted due to national recommendations for restrictions regarding the COVID-19 Pandemic (e.g. social distancing) in an effort to limit this patient's exposure and mitigate transmission in our community.  Due to his co-morbid illnesses, this patient is at least at moderate risk for complications without adequate follow up.  This format is felt to be most appropriate for this patient at this time.  The patient did not have access to video technology/had technical difficulties with video requiring transitioning to audio format only (telephone).  All issues noted in this document were discussed and addressed.  No physical exam could be performed with this format.  Please refer to the patient's chart for his  consent [facility].   Date:  12/08/2018   ID:  Martin James, DOB [birth year], MRN [id number]  Patient Location: Home Provider Location: Home  PCP:  Shelva MajesticHunter, Stephen O, MD  Cardiologist:  Prentice DockerSuresh Koneswaran, MD  Electrophysiologist:  None   Evaluation Performed:  Follow-Up Visit  Chief Complaint:  CAD and atrial fibrillation  History of Present Illness:    Martin James is a 78 y.o. male with a history of coronary artery disease and reportedly had 4 stents placed, 2 roughly 20 years ago and another 2 in 2005. He also has a history of hypertension and hyperlipidemia.   I diagnosed him with atrial fibrillation on 09/16/18.  He called about increased HR's 100-110 bpm on 10/19/18 and I increased Toprol-XL to 75 mg daily. He has since stopped Coreg and Entresto due to hypotension.  Doing better than 2 months ago. Felt really bad on Entresto.   He walked 4 miles today. HR is in 90+ bpm range with walking.  Denies exertional chest pain. Denies exertional fatigue. Has had occasional "trembling" but seldom.  Denies bleeding problems with warfarin.  The patient does not have symptoms concerning for COVID-19 infection  (fever, chills, cough, or new shortness of breath).    Past Medical History:  Diagnosis Date  . Allergy   . CAD (coronary artery disease)   . Cataract   . Depression   . Hyperlipidemia   . Hypertension   . Insomnia   . Sinus congestion    Past Surgical History:  Procedure Laterality Date  . APPENDECTOMY    . CATARACT EXTRACTION W/PHACO Right 07/08/2013   Procedure: CATARACT EXTRACTION PHACO AND INTRAOCULAR LENS PLACEMENT (IOC);  Surgeon: Gemma PayorKerry Hunt, MD;  Location: [facility];  Service: Ophthalmology;  Laterality: Right;  CDE 26.34  . CORONARY ANGIOPLASTY     stents x4  . undescended testicle surgery       Current Meds  Medication Sig  . aspirin EC 81 MG tablet Take 81 mg by mouth daily.  Marland Kitchen. atorvastatin (LIPITOR) 40 MG tablet TAKE 1 TABLET BY MOUTH ONCE DAILY  . isosorbide mononitrate (IMDUR) 60 MG 24 hr tablet TAKE 1 TABLET BY MOUTH ONCE DAILY  . metoprolol succinate (TOPROL-XL) 50 MG 24 hr tablet Take 1 1/2 tabs (75mg ) by mouth daily  . warfarin (COUMADIN) 5 MG tablet Take 1 tablet (5 mg total) by mouth daily.     Allergies:   Patient has no known allergies.   Social History   Tobacco Use  . Smoking status: Former Smoker    Packs/day: 0.25    Years: 30.00    Pack years: 7.50    Types: Cigarettes    Last attempt to quit: 08/05/1990    Years since  quitting: 28.3  . Smokeless tobacco: Never Used  Substance Use Topics  . Alcohol use: Yes    Alcohol/week: 2.0 standard drinks    Types: 1 Glasses of wine, 1 Cans of beer per week    Comment: occassional  . Drug use: No     Family Hx: The patient's family history includes Diabetes (age of onset: 70) in his father; Stroke in his father. There is no history of Colon cancer, Rectal cancer, or Stomach cancer.  ROS:   Please see the history of present illness.     All other systems reviewed and are negative.   Prior CV studies:   The following studies were reviewed today:  Echo 10/01/18:   1. The left ventricle has  moderately reduced systolic function, with an ejection fraction of 35-40%. The cavity size was normal. Left ventricular diastology could not be evaluated secondary to atrial fibrillation. Elevated left ventricular end-diastolic  pressure Left ventrical global hypokinesis without regional wall motion abnormalities.  2. Left atrial size was severely dilated.  3. 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 Mild thickening of the mitral valve leaflet. Mitral valve regurgitation is moderate by color flow Doppler.  4. The tricuspid valve is normal in structure.  5. The aortic valve is tricuspid Mild thickening of the aortic valve Mild calcification of the aortic valve. Aortic valve regurgitation is mild by color flow Doppler. Mild-moderate stenosis of the aortic valve.  6. There is mild dilatation of the aortic root.  7. The interatrial septum was not well visualized.  Stress test 10/15/18:   Defect 1: There is a large defect of severe severity present in the basal inferoseptal, basal inferior, mid inferoseptal, mid inferior, apical anterior, apical septal and apical inferior location.  This is a high risk study.  Findings consistent with prior myocardial infarction. No ischemic zones.  Nuclear stress EF: 16%.   Labs/Other Tests and Data Reviewed:    EKG:  No ECG reviewed.  Recent Labs: 07/10/2018: Hemoglobin 14.2; Platelets 197.0 09/21/2018: ALT 20 10/09/2018: BUN 13; Creatinine, Ser 1.04; Potassium 4.6; Sodium 135   Recent Lipid Panel Lab Results  Component Value Date/Time   CHOL 110 07/10/2018 08:55 AM   TRIG 50.0 07/10/2018 08:55 AM   HDL 37.10 (L) 07/10/2018 08:55 AM   CHOLHDL 3 07/10/2018 08:55 AM   LDLCALC 63 07/10/2018 08:55 AM   LDLDIRECT 60.0 07/10/2017 09:05 AM    Wt Readings from Last 3 Encounters:  12/08/18 155 lb (70.3 kg)  10/16/18 153 lb (69.4 kg)  10/09/18 155 lb (70.3 kg)     Objective:    Vital Signs:  BP 126/80   Pulse 83   Temp (!) 97.2 F (36.2 C)   Ht 5' 8.5"  (1.74 m)   Wt 155 lb (70.3 kg)   BMI 23.22 kg/m    VITAL SIGNS:  reviewed  ASSESSMENT & PLAN:    1. Coronary artery disease with a history of multiple stents: Symptomatically stable.   I will continue ASA, Lipitor, carvedilol, and Imdur. Stress test and echo reviewed above, LVEF 35-40%. No ischemic zones.  2. Chronic hypertension: Blood pressure is controlled. No changes to therapy.  3. Hyperlipidemia: LDL 63 on 07/10/2018.  Continue atorvastatin.  4.  Atrial fibrillation: Could not afford Eliquis. Continue warfarin for anticoagulation. LA is severely dilated. Will aim for rate control strategy.  5. Aortic stenosis: Mild to moderate by echo in Feb 2020. I will monitor.  6. Cardiomyopathy/chronic systolic HF: Could not tolerate Coreg or Entresto  due to low BP. Continue Toprol-XL. NYHA class II symptoms. No diuretics required at this time.   COVID-19 Education: The signs and symptoms of COVID-19 were discussed with the patient and how to seek care for testing (follow up with PCP or arrange E-visit).  The importance of social distancing was discussed today.  Time:   Today, I have spent 25 minutes with the patient with telehealth technology discussing the above problems.     Medication Adjustments/Labs and Tests Ordered: Current medicines are reviewed at length with the patient today.  Concerns regarding medicines are outlined above.   Tests Ordered: No orders of the defined types were placed in this encounter.   Medication Changes: No orders of the defined types were placed in this encounter.   Disposition:  Follow up 4 months  Signed, Prentice Docker, MD  12/08/2018 3:40 PM    [facility]

## 2018-12-08 NOTE — Patient Instructions (Addendum)
Medication Instructions:   Your physician recommends that you continue on your current medications as directed. Please refer to the Current Medication list given to you today.  Labwork:  NONE  Testing/Procedures:  NONE  Follow-Up:  Your physician recommends that you schedule a follow-up appointment in: 4 months.  Any Other Special Instructions Will Be Listed Below (If Applicable).  If you need a refill on your cardiac medications before your next appointment, please call your pharmacy. 

## 2018-12-14 ENCOUNTER — Telehealth: Payer: Self-pay | Admitting: *Deleted

## 2018-12-14 NOTE — Telephone Encounter (Signed)

## 2018-12-15 ENCOUNTER — Other Ambulatory Visit: Payer: Self-pay

## 2018-12-15 ENCOUNTER — Ambulatory Visit (INDEPENDENT_AMBULATORY_CARE_PROVIDER_SITE_OTHER): Payer: Medicare Other | Admitting: *Deleted

## 2018-12-15 DIAGNOSIS — Z5181 Encounter for therapeutic drug level monitoring: Secondary | ICD-10-CM | POA: Diagnosis not present

## 2018-12-15 DIAGNOSIS — I480 Paroxysmal atrial fibrillation: Secondary | ICD-10-CM

## 2018-12-15 LAB — POCT INR: INR: 3 (ref 2.0–3.0)

## 2018-12-15 NOTE — Patient Instructions (Signed)
Continue coumadin 1 tablet daily except 1/2 tablet on Mondays, Wednesdays and Fridays  Continue greens Recheck INR in 4 weeks 

## 2018-12-23 ENCOUNTER — Other Ambulatory Visit: Payer: Self-pay | Admitting: Cardiovascular Disease

## 2018-12-23 NOTE — Telephone Encounter (Signed)
Done

## 2019-01-11 ENCOUNTER — Telehealth: Payer: Self-pay | Admitting: *Deleted

## 2019-01-11 NOTE — Telephone Encounter (Signed)

## 2019-01-12 ENCOUNTER — Ambulatory Visit (INDEPENDENT_AMBULATORY_CARE_PROVIDER_SITE_OTHER): Payer: Medicare Other | Admitting: *Deleted

## 2019-01-12 DIAGNOSIS — Z5181 Encounter for therapeutic drug level monitoring: Secondary | ICD-10-CM

## 2019-01-12 DIAGNOSIS — I480 Paroxysmal atrial fibrillation: Secondary | ICD-10-CM | POA: Diagnosis not present

## 2019-01-12 LAB — POCT INR: INR: 2.6 (ref 2.0–3.0)

## 2019-01-12 MED ORDER — WARFARIN SODIUM 5 MG PO TABS: ORAL_TABLET | ORAL | 3 refills | Status: DC

## 2019-01-12 NOTE — Patient Instructions (Signed)
Continue coumadin 1 tablet daily except 1/2 tablet on Mondays, Wednesdays and Fridays Continue greens Recheck INR in 5 weeks

## 2019-02-16 ENCOUNTER — Ambulatory Visit (INDEPENDENT_AMBULATORY_CARE_PROVIDER_SITE_OTHER): Payer: Medicare Other | Admitting: *Deleted

## 2019-02-16 DIAGNOSIS — Z5181 Encounter for therapeutic drug level monitoring: Secondary | ICD-10-CM | POA: Diagnosis not present

## 2019-02-16 DIAGNOSIS — I480 Paroxysmal atrial fibrillation: Secondary | ICD-10-CM | POA: Diagnosis not present

## 2019-02-16 LAB — POCT INR: INR: 2.5 (ref 2.0–3.0)

## 2019-02-16 NOTE — Patient Instructions (Signed)
Continue coumadin 1 tablet daily except 1/2 tablet on Mondays, Wednesdays and Fridays  Continue greens Recheck INR in 4 weeks 

## 2019-03-01 ENCOUNTER — Other Ambulatory Visit: Payer: Self-pay | Admitting: Family Medicine

## 2019-03-17 ENCOUNTER — Telehealth: Payer: Self-pay | Admitting: *Deleted

## 2019-03-17 NOTE — Telephone Encounter (Signed)

## 2019-03-18 ENCOUNTER — Other Ambulatory Visit: Payer: Self-pay

## 2019-03-18 ENCOUNTER — Ambulatory Visit (INDEPENDENT_AMBULATORY_CARE_PROVIDER_SITE_OTHER): Payer: Medicare Other | Admitting: *Deleted

## 2019-03-18 DIAGNOSIS — Z5181 Encounter for therapeutic drug level monitoring: Secondary | ICD-10-CM | POA: Diagnosis not present

## 2019-03-18 DIAGNOSIS — I480 Paroxysmal atrial fibrillation: Secondary | ICD-10-CM

## 2019-03-18 LAB — POCT INR: INR: 1.8 — AB (ref 2.0–3.0)

## 2019-03-18 NOTE — Patient Instructions (Signed)
Take 1 tablet today and tomorrow then resume 1 tablet daily except 1/2 tablet on Mondays, Wednesdays and Fridays Continue greens Recheck INR in 4 weeks

## 2019-04-13 ENCOUNTER — Encounter: Payer: Self-pay | Admitting: Cardiovascular Disease

## 2019-04-13 ENCOUNTER — Telehealth (INDEPENDENT_AMBULATORY_CARE_PROVIDER_SITE_OTHER): Payer: Medicare Other | Admitting: Cardiovascular Disease

## 2019-04-13 VITALS — BP 116/70 | HR 77 | Temp 95.4°F | Ht 69.0 in | Wt 155.0 lb

## 2019-04-13 DIAGNOSIS — I480 Paroxysmal atrial fibrillation: Secondary | ICD-10-CM | POA: Diagnosis not present

## 2019-04-13 DIAGNOSIS — I10 Essential (primary) hypertension: Secondary | ICD-10-CM

## 2019-04-13 DIAGNOSIS — I429 Cardiomyopathy, unspecified: Secondary | ICD-10-CM

## 2019-04-13 DIAGNOSIS — I25118 Atherosclerotic heart disease of native coronary artery with other forms of angina pectoris: Secondary | ICD-10-CM

## 2019-04-13 DIAGNOSIS — Z955 Presence of coronary angioplasty implant and graft: Secondary | ICD-10-CM

## 2019-04-13 DIAGNOSIS — I350 Nonrheumatic aortic (valve) stenosis: Secondary | ICD-10-CM | POA: Diagnosis not present

## 2019-04-13 DIAGNOSIS — I5022 Chronic systolic (congestive) heart failure: Secondary | ICD-10-CM | POA: Diagnosis not present

## 2019-04-13 DIAGNOSIS — I2510 Atherosclerotic heart disease of native coronary artery without angina pectoris: Secondary | ICD-10-CM

## 2019-04-13 DIAGNOSIS — E785 Hyperlipidemia, unspecified: Secondary | ICD-10-CM

## 2019-04-13 NOTE — Patient Instructions (Signed)

## 2019-04-13 NOTE — Progress Notes (Signed)
Virtual Visit via Telephone Note   This visit type was conducted due to national recommendations for restrictions regarding the COVID-19 Pandemic (e.g. social distancing) in an effort to limit this patient's exposure and mitigate transmission in our community.  Due to his co-morbid illnesses, this patient is at least at moderate risk for complications without adequate follow up.  This format is felt to be most appropriate for this patient at this time.  The patient did not have access to video technology/had technical difficulties with video requiring transitioning to audio format only (telephone).  All issues noted in this document were discussed and addressed.  No physical exam could be performed with this format.  Please refer to the patient's chart for his  consent to telehealth for [facility].   Date:  04/13/2019   ID:  Martin, James Nov 25, 1940, MRN [id number]  Patient Location: Home Provider Location: Office  PCP:  [name], MD  Cardiologist:  Prentice Docker, MD  Electrophysiologist:  None   Evaluation Performed:  Follow-Up Visit  Chief Complaint:  CAD and atrial fibrillation  History of Present Illness:    Martin James is a 78 y.o. male with a history of coronary artery disease and reportedly had 4 stents placed, 2 roughly 20 years ago and another 2 in 2005. He also has a history of hypertension and hyperlipidemia.   I diagnosed him with atrial fibrillation on 09/16/18.  Walks 6 miles a day. Denies chest pain and shortness of breath. He seldom has palpitations.  He is taking care of his wife who recently underwent knee replacement surgery.  Denies bleeding problems with warfarin.  The patient does not have symptoms concerning for COVID-19 infection (fever, chills, cough, or new shortness of breath).    Past Medical History:  Diagnosis Date  . Allergy   . CAD (coronary artery disease)   . Cataract   . Depression   . Hyperlipidemia   . Hypertension    . Insomnia   . Sinus congestion    Past Surgical History:  Procedure Laterality Date  . APPENDECTOMY    . CATARACT EXTRACTION W/PHACO Right 07/08/2013   Procedure: CATARACT EXTRACTION PHACO AND INTRAOCULAR LENS PLACEMENT (IOC);  Surgeon: Gemma Payor, MD;  Location: [facility];  Service: Ophthalmology;  Laterality: Right;  CDE 26.34  . CORONARY ANGIOPLASTY     stents x4  . undescended testicle surgery       Current Meds  Medication Sig  . aspirin EC 81 MG tablet Take 81 mg by mouth daily.  Marland Kitchen atorvastatin (LIPITOR) 40 MG tablet TAKE 1 TABLET BY MOUTH ONCE DAILY  . isosorbide mononitrate (IMDUR) 60 MG 24 hr tablet Take 1 tablet by mouth once daily  . metoprolol succinate (TOPROL-XL) 50 MG 24 hr tablet Take 1 1/2 tabs (75mg ) by mouth daily  . warfarin (COUMADIN) 5 MG tablet Take 1 tablet daily or as directed (Patient taking differently: Take 1 tablet daily or as directed MANAGED BY LISA)     Allergies:   Patient has no known allergies.   Social History   Tobacco Use  . Smoking status: Former Smoker    Packs/day: 0.25    Years: 30.00    Pack years: 7.50    Types: Cigarettes    Quit date: 08/05/1990    Years since quitting: 28.7  . Smokeless tobacco: Never Used  Substance Use Topics  . Alcohol use: Yes    Alcohol/week: 2.0 standard drinks    Types: 1  Glasses of wine, 1 Cans of beer per week    Comment: occassional  . Drug use: No     Family Hx: The patient's family history includes Diabetes (age of onset: 38) in his father; Stroke in his father. There is no history of Colon cancer, Rectal cancer, or Stomach cancer.  ROS:   Please see the history of present illness.     All other systems reviewed and are negative.   Prior CV studies:   The following studies were reviewed today:  Echo 10/01/18:  1. The left ventricle has moderately reduced systolic function, with an ejection fraction of 35-40%. The cavity size was normal. Left ventricular diastology could not be evaluated  secondary to atrial fibrillation. Elevated left ventricular end-diastolic pressure Left ventrical global hypokinesis without regional wall motion abnormalities. 2. Left atrial size was severely dilated. 3. 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 Mild thickening of the mitral valve leaflet. Mitral valve regurgitation is moderate by color flow Doppler. 4. The tricuspid valve is normal in structure. 5. The aortic valve is tricuspid Mild thickening of the aortic valve Mild calcification of the aortic valve. Aortic valve regurgitation is mild by color flow Doppler. Mild-moderate stenosis of the aortic valve. 6. There is mild dilatation of the aortic root. 7. The interatrial septum was not well visualized.  Stress test 10/15/18:   Defect 1: There is a large defect of severe severity present in the basal inferoseptal, basal inferior, mid inferoseptal, mid inferior, apical anterior, apical septal and apical inferior location.  This is a high risk study.  Findings consistent with prior myocardial infarction. No ischemic zones.  Nuclear stress EF: 16%.  Labs/Other Tests and Data Reviewed:    EKG:  No ECG reviewed.  Recent Labs: 07/10/2018: Hemoglobin 14.2; Platelets 197.0 09/21/2018: ALT 20 10/09/2018: BUN 13; Creatinine, Ser 1.04; Potassium 4.6; Sodium 135   Recent Lipid Panel Lab Results  Component Value Date/Time   CHOL 110 07/10/2018 08:55 AM   TRIG 50.0 07/10/2018 08:55 AM   HDL 37.10 (L) 07/10/2018 08:55 AM   CHOLHDL 3 07/10/2018 08:55 AM   LDLCALC 63 07/10/2018 08:55 AM   LDLDIRECT 60.0 07/10/2017 09:05 AM    Wt Readings from Last 3 Encounters:  04/13/19 155 lb (70.3 kg)  12/08/18 155 lb (70.3 kg)  10/16/18 153 lb (69.4 kg)     Objective:    Vital Signs:  BP 116/70   Pulse 77   Temp (!) 95.4 F (35.2 C)   Ht 5\' 9"  (1.753 m)   Wt 155 lb (70.3 kg)   BMI 22.89 kg/m    VITAL SIGNS:  reviewed  ASSESSMENT & PLAN:    1. Coronary artery disease with a history of  multiple stents: Symptomatically stable.I will continue ASA, Lipitor, carvedilol, and Imdur.Stress test and echo reviewed above, LVEF 35-40%. No ischemic zones.  2. Chronic hypertension: Blood pressure is controlled. No changes to therapy.  3. Hyperlipidemia:LDL 63 on 07/10/2018. Continue atorvastatin.  4. Paroxysmal atrial fibrillation: Could not afford Eliquis. Continue warfarin for anticoagulation. LA is severely dilated. Will aim for rate control strategy.  5. Aortic stenosis: Mild to moderate by echo in Feb 2020. I will monitor.  6. Cardiomyopathy/chronic systolic HF: Could not tolerate Coreg or Entresto due to low BP. Continue Toprol-XL. NYHA class II symptoms. No diuretics required at this time.   COVID-19 Education: The signs and symptoms of COVID-19 were discussed with the patient and how to seek care for testing (follow up with PCP or arrange  E-visit).  The importance of social distancing was discussed today.  Time:   Today, I have spent 5 minutes with the patient with telehealth technology discussing the above problems.     Medication Adjustments/Labs and Tests Ordered: Current medicines are reviewed at length with the patient today.  Concerns regarding medicines are outlined above.   Tests Ordered: No orders of the defined types were placed in this encounter.   Medication Changes: No orders of the defined types were placed in this encounter.   Follow Up:  Virtual Visit or In Person in 6 month(s)  Signed, Prentice DockerSuresh Lateka Rady, MD  04/13/2019 2:31 PM    [facility]

## 2019-04-16 ENCOUNTER — Ambulatory Visit (INDEPENDENT_AMBULATORY_CARE_PROVIDER_SITE_OTHER): Payer: Medicare Other | Admitting: *Deleted

## 2019-04-16 ENCOUNTER — Other Ambulatory Visit: Payer: Self-pay

## 2019-04-16 DIAGNOSIS — I480 Paroxysmal atrial fibrillation: Secondary | ICD-10-CM

## 2019-04-16 DIAGNOSIS — Z5181 Encounter for therapeutic drug level monitoring: Secondary | ICD-10-CM

## 2019-04-16 LAB — POCT INR: INR: 2.5 (ref 2.0–3.0)

## 2019-04-16 NOTE — Patient Instructions (Signed)
Continue coumadin 1 tablet daily except 1/2 tablet on Mondays, Wednesdays and Fridays  Continue greens Recheck INR in 4 weeks 

## 2019-05-17 ENCOUNTER — Other Ambulatory Visit: Payer: Self-pay

## 2019-05-17 ENCOUNTER — Ambulatory Visit (INDEPENDENT_AMBULATORY_CARE_PROVIDER_SITE_OTHER): Payer: Medicare Other | Admitting: *Deleted

## 2019-05-17 DIAGNOSIS — I480 Paroxysmal atrial fibrillation: Secondary | ICD-10-CM | POA: Diagnosis not present

## 2019-05-17 DIAGNOSIS — Z5181 Encounter for therapeutic drug level monitoring: Secondary | ICD-10-CM

## 2019-05-17 LAB — POCT INR: INR: 2.4 (ref 2.0–3.0)

## 2019-05-17 NOTE — Patient Instructions (Signed)
Continue coumadin 1 tablet daily except 1/2 tablet on Mondays, Wednesdays and Fridays Continue greens Recheck INR in 6 weeks

## 2019-05-31 ENCOUNTER — Other Ambulatory Visit: Payer: Self-pay | Admitting: Family Medicine

## 2019-06-28 ENCOUNTER — Ambulatory Visit (INDEPENDENT_AMBULATORY_CARE_PROVIDER_SITE_OTHER): Payer: Medicare Other | Admitting: *Deleted

## 2019-06-28 ENCOUNTER — Other Ambulatory Visit: Payer: Self-pay

## 2019-06-28 DIAGNOSIS — I480 Paroxysmal atrial fibrillation: Secondary | ICD-10-CM

## 2019-06-28 DIAGNOSIS — Z5181 Encounter for therapeutic drug level monitoring: Secondary | ICD-10-CM

## 2019-06-28 LAB — POCT INR: INR: 1.8 — AB (ref 2.0–3.0)

## 2019-06-28 NOTE — Patient Instructions (Signed)
Take warfarin 1 tablet tonight then resume 1 tablet daily except 1/2 tablet on Mondays, Wednesdays and Fridays Continue greens Recheck INR in 4 weeks

## 2019-07-12 ENCOUNTER — Other Ambulatory Visit: Payer: Self-pay

## 2019-07-13 ENCOUNTER — Encounter: Payer: Self-pay | Admitting: Family Medicine

## 2019-07-13 ENCOUNTER — Ambulatory Visit (INDEPENDENT_AMBULATORY_CARE_PROVIDER_SITE_OTHER): Payer: Medicare Other | Admitting: Family Medicine

## 2019-07-13 VITALS — BP 132/78 | HR 100 | Temp 98.1°F | Ht 67.5 in | Wt 153.8 lb

## 2019-07-13 DIAGNOSIS — I25118 Atherosclerotic heart disease of native coronary artery with other forms of angina pectoris: Secondary | ICD-10-CM

## 2019-07-13 DIAGNOSIS — E782 Mixed hyperlipidemia: Secondary | ICD-10-CM

## 2019-07-13 DIAGNOSIS — Z0000 Encounter for general adult medical examination without abnormal findings: Secondary | ICD-10-CM

## 2019-07-13 DIAGNOSIS — I10 Essential (primary) hypertension: Secondary | ICD-10-CM

## 2019-07-13 DIAGNOSIS — K760 Fatty (change of) liver, not elsewhere classified: Secondary | ICD-10-CM

## 2019-07-13 DIAGNOSIS — Z1283 Encounter for screening for malignant neoplasm of skin: Secondary | ICD-10-CM

## 2019-07-13 DIAGNOSIS — R739 Hyperglycemia, unspecified: Secondary | ICD-10-CM | POA: Diagnosis not present

## 2019-07-13 DIAGNOSIS — H532 Diplopia: Secondary | ICD-10-CM

## 2019-07-13 DIAGNOSIS — I4891 Unspecified atrial fibrillation: Secondary | ICD-10-CM

## 2019-07-13 LAB — CBC WITH DIFFERENTIAL/PLATELET
Basophils Absolute: 0.1 10*3/uL (ref 0.0–0.1)
Basophils Relative: 0.9 % (ref 0.0–3.0)
Eosinophils Absolute: 0.2 10*3/uL (ref 0.0–0.7)
Eosinophils Relative: 3.3 % (ref 0.0–5.0)
HCT: 44.6 % (ref 39.0–52.0)
Hemoglobin: 15 g/dL (ref 13.0–17.0)
Lymphocytes Relative: 16.6 % (ref 12.0–46.0)
Lymphs Abs: 1.1 10*3/uL (ref 0.7–4.0)
MCHC: 33.6 g/dL (ref 30.0–36.0)
MCV: 96.1 fl (ref 78.0–100.0)
Monocytes Absolute: 0.6 10*3/uL (ref 0.1–1.0)
Monocytes Relative: 9.3 % (ref 3.0–12.0)
Neutro Abs: 4.6 10*3/uL (ref 1.4–7.7)
Neutrophils Relative %: 69.9 % (ref 43.0–77.0)
Platelets: 175 10*3/uL (ref 150.0–400.0)
RBC: 4.64 Mil/uL (ref 4.22–5.81)
RDW: 13.3 % (ref 11.5–15.5)
WBC: 6.6 10*3/uL (ref 4.0–10.5)

## 2019-07-13 LAB — LIPID PANEL
Cholesterol: 127 mg/dL (ref 0–200)
HDL: 38.3 mg/dL — ABNORMAL LOW (ref >39.00)
LDL Cholesterol: 75 mg/dL (ref 0–99)
NonHDL: 88.93
Total CHOL/HDL Ratio: 3
Triglycerides: 68 mg/dL (ref 0.0–149.0)
VLDL: 13.6 mg/dL (ref 0.0–40.0)

## 2019-07-13 LAB — COMPREHENSIVE METABOLIC PANEL
ALT: 19 U/L (ref 0–53)
AST: 26 U/L (ref 0–37)
Albumin: 4.2 g/dL (ref 3.5–5.2)
Alkaline Phosphatase: 50 U/L (ref 39–117)
BUN: 18 mg/dL (ref 6–23)
CO2: 29 mEq/L (ref 19–32)
Calcium: 9.5 mg/dL (ref 8.4–10.5)
Chloride: 101 mEq/L (ref 96–112)
Creatinine, Ser: 0.98 mg/dL (ref 0.40–1.50)
GFR: 73.66 mL/min (ref >60.00)
Glucose, Bld: 103 mg/dL — ABNORMAL HIGH (ref 70–99)
Potassium: 4.5 mEq/L (ref 3.5–5.1)
Sodium: 135 mEq/L (ref 135–145)
Total Bilirubin: 1 mg/dL (ref 0.2–1.2)
Total Protein: 7 g/dL (ref 6.0–8.3)

## 2019-07-13 LAB — POC URINALSYSI DIPSTICK (AUTOMATED)
Bilirubin, UA: NEGATIVE
Glucose, UA: NEGATIVE
Ketones, UA: NEGATIVE
Leukocytes, UA: NEGATIVE
Nitrite, UA: NEGATIVE
Protein, UA: NEGATIVE
Spec Grav, UA: 1.025 (ref 1.010–1.025)
Urobilinogen, UA: 0.2 E.U./dL
pH, UA: 6 (ref 5.0–8.0)

## 2019-07-13 LAB — HEMOGLOBIN A1C: Hgb A1c MFr Bld: 5.8 % (ref 4.6–6.5)

## 2019-07-13 MED ORDER — METOPROLOL SUCCINATE ER 100 MG PO TB24: ORAL_TABLET | ORAL | 3 refills | Status: DC

## 2019-07-13 NOTE — Progress Notes (Signed)
Phone: [phone]   Subjective:  Patient presents today for their annual physical. Chief complaint-noted.   See problem oriented charting- Review of Systems  Constitutional: Negative.   HENT: Negative.   Eyes: Negative.   Respiratory: Negative.   Cardiovascular: Positive for palpitations.  Gastrointestinal: Negative.   Musculoskeletal: Negative.   Skin: Negative.   Neurological: Negative.   Endo/Heme/Allergies: Bruises/bleeds easily: Taking Coumadin.  Psychiatric/Behavioral: Negative.    The following were reviewed and entered/updated in epic: Past Medical History:  Diagnosis Date  . Allergy   . CAD (coronary artery disease)   . Cataract   . Depression   . Hyperlipidemia   . Hypertension   . Insomnia   . Sinus congestion    Patient Active Problem List   Diagnosis Date Noted  . Atrial fibrillation ([place]) 09/29/2018    Priority: High  . CAD (coronary artery disease) 02/17/2007    Priority: High  . Binocular diplopia 12/31/2016    Priority: Medium  . Hyperlipidemia 02/17/2007    Priority: Medium  . Essential hypertension 02/17/2007    Priority: Medium  . Headache 05/24/2014    Priority: Low  . INSOMNIA, PERSISTENT 03/03/2007    Priority: Low  . Depression 03/03/2007    Priority: Low  . SYNDROME, CARPAL TUNNEL 02/17/2007    Priority: Low  . Fatty liver 02/17/2007    Priority: Low  . Encounter for therapeutic drug monitoring 09/29/2018  . Chronic sinusitis 07/10/2017  . Right shoulder pain 09/02/2014   Past Surgical History:  Procedure Laterality Date  . APPENDECTOMY    . CATARACT EXTRACTION W/PHACO Right 07/08/2013   Procedure: CATARACT EXTRACTION PHACO AND INTRAOCULAR LENS PLACEMENT ([place]);  Surgeon: Tonny Branch, MD;  Location: [facility];  Service: Ophthalmology;  Laterality: Right;  CDE 26.34  . CORONARY ANGIOPLASTY     stents x4  . undescended testicle surgery      Family History  Problem Relation Age of Onset  . Diabetes Father 19  . Stroke Father         smoker  . Colon cancer Neg Hx   . Rectal cancer Neg Hx   . Stomach cancer Neg Hx     Medications- reviewed and updated Current Outpatient Medications  Medication Sig Dispense Refill  . aspirin EC 81 MG tablet Take 81 mg by mouth daily.    Marland Kitchen atorvastatin (LIPITOR) 40 MG tablet Take 1 tablet by mouth once daily 90 tablet 0  . isosorbide mononitrate (IMDUR) 60 MG 24 hr tablet Take 1 tablet by mouth once daily 90 tablet 0  . metoprolol succinate (TOPROL-XL) 100 MG 24 hr tablet Take 1 tab (100 mg) by mouth daily 90 tablet 3  . warfarin (COUMADIN) 5 MG tablet Take 1 tablet daily or as directed (Patient taking differently: Take 1 tablet daily or as directed MANAGED BY LISA) 90 tablet 3   No current facility-administered medications for this visit.     Allergies-reviewed and updated No Known Allergies  Social History   Social History Narrative   Married 1964. 2 kids-son, daughter. 5 grandkids.       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      Hobbies: walk 8 miles a day, neighborhood handyman   Objective  Objective:  BP 132/78   Pulse 100   Temp 98.1 F (36.7 C) (Temporal)   Ht 5' 7.5" (1.715 m)   Wt 153 lb 12.8 oz (69.8 kg)   SpO2 96%   BMI 23.73 kg/m  Gen: NAD, resting comfortably HEENT: Mask not  removed due to covid 19. TM normal. Bridge of nose normal. Eyelids normal.  Neck: no thyromegaly or cervical lymphadenopathy  CV: tachycardic and irregular no murmurs rubs or gallops Lungs: CTAB no crackles, wheeze, rhonchi Abdomen: soft/nontender/nondistended/normal bowel sounds. No rebound or guarding.  Ext: no edema Skin: warm, dry Neuro: grossly normal, moves all extremities, PERRLA   Assessment and Plan  78 y.o. male presenting for annual physical.  Health Maintenance counseling: 1. Anticipatory guidance: Patient counseled regarding regular dental exams q6 months, eye exams - advised to get updated exam,  avoiding smoking and second hand smoke , limiting alcohol to 2  beverages per week - 2 or less per month for him which is better with fatty liver.  2. Risk factor reduction:  Advised patient of need for regular exercise and diet rich and fruits and vegetables to reduce risk of heart attack and stroke. Exercise- walking daily-still walking 7 miles a day . Diet-eating healthy options. Excellent BMI Wt Readings from Last 3 Encounters:  07/13/19 153 lb 12.8 oz (69.8 kg)  04/13/19 155 lb (70.3 kg)  12/08/18 155 lb (70.3 kg)  3. Immunizations/screenings/ancillary studies-discussed Shingrix .  Immunization History  Administered Date(s) Administered  . Influenza Whole 06/04/2007, 06/07/2009, 05/03/2010  . Influenza, High Dose Seasonal PF 05/10/2013, 05/25/2016, 04/30/2018, 05/25/2019  . Influenza-Unspecified 05/14/2014, 06/21/2015, 05/13/2017, 04/30/2018  . Pneumococcal Conjugate-13 06/21/2015  . Pneumococcal Polysaccharide-23 06/04/2007  . Tdap 06/11/2012   Health Maintenance Due  Topic Date Due  . INFLUENZA VACCINE  Already had on 05/25/2019 03/06/2019   4. Prostate cancer screening- past age based screening recommendations. No changes in urinary symptoms Lab Results  Component Value Date   PSA 0.58 06/03/2011   PSA 0.48 04/26/2010   PSA 0.51 05/26/2008   5. Colon cancer screening -2015-only findings were severe diverticulosis-was told no repeat due to age. No blood in stool or melena.  reviewed report from Dr. Arlyce Dice 6. Skin cancer screening-no dermatologist- will refer today.  advised regular sunscreen use and to wear a hat. Denies worrisome, changing, or new skin lesions- does have some slightly light colored patches on top of scalp that do not itch- very mild scale. Discussed could use vaseline.  7. Former smoker 7.5 pack years and quit in 1992. UA ordered placed   Status of chronic or acute concerns   # atrial fibrillation S:Afollowed by cardiology.  Patient on Coumadin for anticoagulation.  He is on metoprolol for rate control. In February, Echo  35% EF and started on entresto. He states he felt "horrible"- dizzy and off balance. Even when he came off that has remained shaky- was on it for 2 weeks. Has done better on metoprolol- now up to 75 mg XR. HR at home around 76 or 78 at the lowest. Today up around 100 and 110 when he first came in- feels ok with this- still able to walk 7 miles a day A/P: Atrial fibrillation - appears to be persistent. Continue coumadin. Patients HR around 100 today and he states usually 75 or above at home - We are going to increase his metoprolol to 100mg  (will use 2 of 50mg  tablets til he runs out then pick up 100mg  tablets). I am going to let Dr. know about the change. I think his blood pressure can tolerate this chang   # tremor- started after entresto and has not resolved. Appears to be intention tremor and no resting tremor. Not clear the cause but hoping metoprolol higher dose helps.   #  Hyperlipidemia S:atorvastatin 40 mg-patient does not want to increase strength of statin unless LDL significantly increased-will use cut off of 80- fortunately last LDL was only 63 Lab Results  Component Value Date   CHOL 110 07/10/2018   HDL 37.10 (L) 07/10/2018   LDLCALC 63 07/10/2018   LDLDIRECT 60.0 07/10/2017   TRIG 50.0 07/10/2018   CHOLHDL 3 07/10/2018   A/P: Excellent control previously- update cholesterol today-continue current medication  # Coronary artery disease S:compliant with statin and aspirin.  He is also on metoprolol and Imdur- no angina while on Imdur.  Follows with cardiology yearly- each February.  A/P: Stable-continue current medications other than metoprolol increase and cardiology follow-up  # Hypertension S:controlled on metoprolol 75 mg 24-houry, Imdur 60 mg.  Has done well off amlodipine since 2018.  Review: taking medications as instructed, no medication side effects noted, no TIAs, no chest pain on exertion, no dyspnea on exertion, no swelling of ankles. Smoker: No.    A/P:  Good control on repeat today.  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- he was advised today  Fatty liver-on prior scan.  We will update LFTs today.  Continue to work on healthy eating/regular exercise- he does a great job with this.   Recommended follow up: Return in about 1 year (around 07/12/2020) for Hyperlipidemia, physical or sooner if needed.  Lab/Order associations: fasting   ICD-10-CM   1. Preventative health care  Z00.00 CBC with Differential/Platelet    Comprehensive metabolic panel    Lipid panel    POCT Urinalysis Dipstick (Automated)  2. Essential hypertension  I10 CBC with Differential/Platelet    Comprehensive metabolic panel    Lipid panel    POCT Urinalysis Dipstick (Automated)  3. Mixed hyperlipidemia  E78.2   4. Coronary artery disease of native artery of native heart with stable angina pectoris (HCC)  I25.118 CBC with Differential/Platelet    Comprehensive metabolic panel    Lipid panel  5. Fatty liver  K76.0   6. Atrial fibrillation, unspecified type (HCC)  I48.91   7. Binocular diplopia  H53.2   8. Skin cancer screening  Z12.83 Dermatology   Meds ordered this encounter  Medications  . metoprolol succinate (TOPROL-XL) 100 MG 24 hr tablet    Sig: Take 1 tab (100 mg) by mouth daily    Dispense:  90 tablet    Refill:  3    Dose increased 10/20/2018   Return precautions advised.  Tana ConchStephen Paloma Grange, MD

## 2019-07-13 NOTE — Patient Instructions (Addendum)
   Please stop by lab before you go If you do not have mychart- we will call you about results within 5 business days of [place] receiving them.  If you have mychart- we will send your results within 3 business days of [place] receiving them.  If abnormal or we want to clarify a result, we will call or mychart you to make sure you receive the message.  If you have questions or concerns or don't hear within 5-7 days, please send [place] a message or call us.   Recommended follow up: Return in about 1 year (around 07/12/2020) for physical or sooner if needed.   Mr. Champlain , Thank you for taking time to come for your Medicare Wellness Visit. I appreciate your ongoing commitment to your health goals. Please review the following plan we discussed and let me know if I can assist you in the future.   These are the goals we discussed: 1. Keep up great job walking 2. Update your eye exam 3. We will call you within two weeks about your referral to dermatology. If you do not hear within 3 weeks, give [place] a call.  4. Please check with your pharmacy to see if they have the shingrix vaccine. If they do- please get this immunization and update [place] by phone call or mychart with dates you receive the vaccine    This is a list of the screening recommended for you and due dates:  Health Maintenance  Topic Date Due  . Tetanus Vaccine  06/11/2022  . Flu Shot  Completed  . Pneumonia vaccines  Completed

## 2019-07-13 NOTE — Addendum Note (Signed)
Addended by: Tomi Likens on: 07/13/2019 10:14 AM   Modules accepted: Orders

## 2019-07-13 NOTE — Progress Notes (Signed)
Phone: [phone]    Subjective:   Patient presents today for their annual wellness visit.    Preventive Screening-Counseling & Management  Smoking Status: former Smoker- quit in 1990s. Get UA. No other screening required Second Hand Smoking status: No smokers in home Alcohol intake: 0-1 per week, usually 2 a month  Risk Factors Regular exercise: 7 miles walking per day Diet: reasonably healthy  Wt Readings from Last 3 Encounters:  07/13/19 153 lb 12.8 oz (69.8 kg)  04/13/19 155 lb (70.3 kg)  12/08/18 155 lb (70.3 kg)    Mobility assessment: excellent mobility- gets on to tablet without assist Fall Risk: None and quit climbing on ladders which is a good thing at his age.  Fall Risk  07/13/2019 07/10/2018 07/10/2018 12/31/2016 10/03/2015  Falls in the past year? 0 0 0 No No  Number falls in past yr: 0 - - - -  Injury with Fall? 0 - - - -  Risk for fall due to : - - - - -  Opioid use history:  no long term opioids use  Cardiac risk factors:  advanced age (older than 101 for men, 51 for women)  treated Hyperlipidemia  treated Hypertension  No diabetes. Did have slight increased risk 10 years ago- will update a1c today Lab Results  Component Value Date   HGBA1C 5.7 12/05/2008   Family History: stroke in dad Personal history of CAD- follows with cardiology    Depression Screen None. PHQ2 0  Depression screen [facility] 2/9 07/13/2019 07/10/2018 07/10/2018 12/31/2016 10/03/2015  Decreased Interest 0 0 0 0 0  Down, Depressed, Hopeless 0 0 0 0 0  PHQ - 2 Score 0 0 0 0 0  Altered sleeping 0 0 - - -  Tired, decreased energy 0 0 - - -  Change in appetite 0 0 - - -  Feeling bad or failure about yourself  0 0 - - -  Trouble concentrating 0 0 - - -  Moving slowly or fidgety/restless 0 0 - - -  Suicidal thoughts 0 0 - - -  PHQ-9 Score 0 0 - - -  Difficult doing work/chores Not difficult at all Not difficult at all - - -    Activities of Daily Living Independent ADLs (toileting,  bathing, dressing, transferring, eating) and IADLs (shopping, housekeeping, managing own medications, and handling finances)  Hearing Difficulties: -patient declines  Cognitive Testing             No reported trouble.   Mini cog: normal clock draw. 1/3 delayed recall. Normal test result   banana sunrise chair   List the Names of Other Physician/Practitioners you currently use: Patient Care Team: [name], MD as PCP - General (Family Medicine) Laqueta Linden, MD as PCP - Cardiology (Cardiology) -[facility] -referring to dermatology today  Immunization History  Administered Date(s) Administered  . Influenza Whole 06/04/2007, 06/07/2009, 05/03/2010  . Influenza, High Dose Seasonal PF 05/10/2013, 05/25/2016, 04/30/2018, 05/25/2019  . Influenza-Unspecified 05/14/2014, 06/21/2015, 05/13/2017, 04/30/2018  . Pneumococcal Conjugate-13 06/21/2015  . Pneumococcal Polysaccharide-23 06/04/2007  . Tdap 06/11/2012   Required Immunizations needed today- discussed shingrix Health Maintenance  Topic Date Due  . Tetanus Vaccine  06/11/2022  . Flu Shot  Completed  . Pneumonia vaccines  Completed   Screening tests-  There are no preventive care reminders to display for this patient. 1. Colon cancer screening- past age based screening 2. Lung Cancer screening- not a candidate 3. Skin cancer screening- refer  to dermatology 4. Prostate cancer screening- past age based screening Lab Results  Component Value Date   PSA 0.58 06/03/2011   PSA 0.48 04/26/2010   PSA 0.51 05/26/2008    ROS- No pertinent positives discovered in course of AWV  The following were reviewed and entered/updated in epic: Past Medical History:  Diagnosis Date  . Allergy   . CAD (coronary artery disease)   . Cataract   . Depression   . Hyperlipidemia   . Hypertension   . Insomnia   . Sinus congestion    Patient Active Problem List   Diagnosis Date Noted  . Atrial fibrillation ([place])  09/29/2018    Priority: High  . CAD (coronary artery disease) 02/17/2007    Priority: High  . Binocular diplopia 12/31/2016    Priority: Medium  . Hyperlipidemia 02/17/2007    Priority: Medium  . Essential hypertension 02/17/2007    Priority: Medium  . Headache 05/24/2014    Priority: Low  . INSOMNIA, PERSISTENT 03/03/2007    Priority: Low  . Depression 03/03/2007    Priority: Low  . SYNDROME, CARPAL TUNNEL 02/17/2007    Priority: Low  . Fatty liver 02/17/2007    Priority: Low  . Encounter for therapeutic drug monitoring 09/29/2018  . Chronic sinusitis 07/10/2017  . Right shoulder pain 09/02/2014   Past Surgical History:  Procedure Laterality Date  . APPENDECTOMY    . CATARACT EXTRACTION W/PHACO Right 07/08/2013   Procedure: CATARACT EXTRACTION PHACO AND INTRAOCULAR LENS PLACEMENT ([place]);  Surgeon: Tonny Branch, MD;  Location: [facility];  Service: Ophthalmology;  Laterality: Right;  CDE 26.34  . CORONARY ANGIOPLASTY     stents x4  . undescended testicle surgery      Family History  Problem Relation Age of Onset  . Diabetes Father 4  . Stroke Father        smoker  . Colon cancer Neg Hx   . Rectal cancer Neg Hx   . Stomach cancer Neg Hx     Medications- reviewed and updated Current Outpatient Medications  Medication Sig Dispense Refill  . aspirin EC 81 MG tablet Take 81 mg by mouth daily.    Marland Kitchen atorvastatin (LIPITOR) 40 MG tablet Take 1 tablet by mouth once daily 90 tablet 0  . isosorbide mononitrate (IMDUR) 60 MG 24 hr tablet Take 1 tablet by mouth once daily 90 tablet 0  . metoprolol succinate (TOPROL-XL) 100 MG 24 hr tablet Take 1 tab (100 mg) by mouth daily 90 tablet 3  . warfarin (COUMADIN) 5 MG tablet Take 1 tablet daily or as directed (Patient taking differently: Take 1 tablet daily or as directed MANAGED BY LISA) 90 tablet 3   No current facility-administered medications for this visit.     Allergies-reviewed and updated No Known Allergies  Social History    Socioeconomic History  . Marital status: Married    Spouse name: Not on file  . Number of children: Not on file  . Years of education: Not on file  . Highest education level: Not on file  Occupational History  . Occupation: retired  Scientific laboratory technician  . Financial resource strain: Not on file  . Food insecurity    Worry: Not on file    Inability: Not on file  . Transportation needs    Medical: Not on file    Non-medical: Not on file  Tobacco Use  . Smoking status: Former Smoker    Packs/day: 0.25    Years: 30.00  Pack years: 7.50    Types: Cigarettes    Quit date: 08/05/1990    Years since quitting: 28.9  . Smokeless tobacco: Never Used  Substance and Sexual Activity  . Alcohol use: Yes    Alcohol/week: 2.0 standard drinks    Types: 1 Glasses of wine, 1 Cans of beer per week    Comment: occassional  . Drug use: No  . Sexual activity: Yes    Birth control/protection: None  Lifestyle  . Physical activity    Days per week: Not on file    Minutes per session: Not on file  . Stress: Not on file  Relationships  . Social Musicianconnections    Talks on phone: Not on file    Gets together: Not on file    Attends religious service: Not on file    Active member of club or organization: Not on file    Attends meetings of clubs or organizations: Not on file    Relationship status: Not on file  Other Topics Concern  . Not on file  Social History Narrative   Married 1964. 2 kids-son, daughter. 5 grandkids.       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 Oropharynx normal Neck: no thyromegaly CV: RRR no murmurs rubs or gallops Lungs: CTAB no crackles, wheeze, rhonchi Abdomen: soft/nontender/nondistended/normal  bowel sounds. No rebound or guarding.  Ext: no edema Skin: warm, dry Neuro: grossly normal, moves all extremities, PERRLA   Assessment/Plan:  AWV completed- discussed recommended screenings and documented any personalized health advice and referrals for preventive counseling. See AVS as well which was given to patient.   Status of chronic or acute concerns  See CPE  Recommended follow up: 1 year AWV along with CPE Future Appointments  Date Time Provider Department Center  07/26/2019 10:15 AM [facility]     Lab/Order associations:   ICD-10-CM  1. Preventative health care  Z00.00   Tana ConchStephen Zeyna Mkrtchyan, MD

## 2019-07-14 ENCOUNTER — Other Ambulatory Visit: Payer: Self-pay

## 2019-07-14 DIAGNOSIS — R319 Hematuria, unspecified: Secondary | ICD-10-CM

## 2019-07-14 MED ORDER — ATORVASTATIN CALCIUM 80 MG PO TABS: 80.0000 mg | ORAL_TABLET | Freq: Every day | ORAL | 3 refills | Status: DC

## 2019-07-15 ENCOUNTER — Other Ambulatory Visit: Payer: Self-pay

## 2019-07-16 ENCOUNTER — Other Ambulatory Visit (INDEPENDENT_AMBULATORY_CARE_PROVIDER_SITE_OTHER): Payer: Medicare Other

## 2019-07-16 DIAGNOSIS — R319 Hematuria, unspecified: Secondary | ICD-10-CM

## 2019-07-16 LAB — URINALYSIS, MICROSCOPIC ONLY

## 2019-07-16 NOTE — Progress Notes (Signed)
Slight blood in the urine-recommend referral to urology under microscopic hematuria.  Team please enter this referral after discussing with patient

## 2019-07-26 ENCOUNTER — Other Ambulatory Visit: Payer: Self-pay

## 2019-07-26 ENCOUNTER — Ambulatory Visit (INDEPENDENT_AMBULATORY_CARE_PROVIDER_SITE_OTHER): Payer: Medicare Other | Admitting: *Deleted

## 2019-07-26 DIAGNOSIS — I480 Paroxysmal atrial fibrillation: Secondary | ICD-10-CM

## 2019-07-26 DIAGNOSIS — Z5181 Encounter for therapeutic drug level monitoring: Secondary | ICD-10-CM

## 2019-07-26 LAB — POCT INR: INR: 2 (ref 2.0–3.0)

## 2019-07-26 NOTE — Patient Instructions (Signed)
Increase warfarin to 1 tablet daily except 1/2 tablet on Tuesdays and Fridays Continue greens Recheck INR in 4 weeks

## 2019-08-12 ENCOUNTER — Encounter: Payer: Self-pay | Admitting: Family Medicine

## 2019-08-13 ENCOUNTER — Other Ambulatory Visit: Payer: Self-pay

## 2019-08-13 DIAGNOSIS — R319 Hematuria, unspecified: Secondary | ICD-10-CM

## 2019-08-23 ENCOUNTER — Other Ambulatory Visit: Payer: Self-pay

## 2019-08-23 ENCOUNTER — Ambulatory Visit (INDEPENDENT_AMBULATORY_CARE_PROVIDER_SITE_OTHER): Payer: Medicare Other | Admitting: *Deleted

## 2019-08-23 DIAGNOSIS — I480 Paroxysmal atrial fibrillation: Secondary | ICD-10-CM | POA: Diagnosis not present

## 2019-08-23 DIAGNOSIS — Z5181 Encounter for therapeutic drug level monitoring: Secondary | ICD-10-CM

## 2019-08-23 LAB — POCT INR: INR: 3.2 — AB (ref 2.0–3.0)

## 2019-08-23 NOTE — Patient Instructions (Signed)
Take warfarin 1/2 tablet tonight then resume 1 tablet daily except 1/2 tablet on Tuesdays and Fridays Has greens 2 x week.  Increase amount a little Recheck INR in 3 weeks

## 2019-08-28 ENCOUNTER — Other Ambulatory Visit: Payer: Self-pay | Admitting: Family Medicine

## 2019-09-13 ENCOUNTER — Ambulatory Visit (INDEPENDENT_AMBULATORY_CARE_PROVIDER_SITE_OTHER): Payer: Medicare Other | Admitting: *Deleted

## 2019-09-13 ENCOUNTER — Other Ambulatory Visit: Payer: Self-pay

## 2019-09-13 DIAGNOSIS — I480 Paroxysmal atrial fibrillation: Secondary | ICD-10-CM

## 2019-09-13 DIAGNOSIS — Z5181 Encounter for therapeutic drug level monitoring: Secondary | ICD-10-CM | POA: Diagnosis not present

## 2019-09-13 LAB — POCT INR: INR: 2.7 (ref 2.0–3.0)

## 2019-09-13 NOTE — Patient Instructions (Signed)
Continue warfarin 1 tablet daily except 1/2 tablet on Tuesdays and Fridays Continue greens/salads Recheck INR in 4 weeks

## 2019-10-01 ENCOUNTER — Encounter: Payer: Self-pay | Admitting: Urology

## 2019-10-01 ENCOUNTER — Other Ambulatory Visit: Payer: Self-pay

## 2019-10-01 ENCOUNTER — Other Ambulatory Visit (HOSPITAL_COMMUNITY)
Admission: RE | Admit: 2019-10-01 | Discharge: 2019-10-01 | Disposition: A | Discharge status: Home / Self Care | Payer: Medicare Other | Source: Other Acute Inpatient Hospital | Attending: Urology | Admitting: Urology

## 2019-10-01 ENCOUNTER — Ambulatory Visit: Payer: Medicare Other | Admitting: Urology

## 2019-10-01 VITALS — BP 139/88 | HR 91 | Temp 94.5°F | Ht 68.0 in | Wt 152.0 lb

## 2019-10-01 DIAGNOSIS — R319 Hematuria, unspecified: Secondary | ICD-10-CM

## 2019-10-01 DIAGNOSIS — R3129 Other microscopic hematuria: Secondary | ICD-10-CM

## 2019-10-01 LAB — POCT URINALYSIS DIPSTICK
Bilirubin, UA: NEGATIVE
Glucose, UA: NEGATIVE
Ketones, UA: NEGATIVE
Leukocytes, UA: NEGATIVE
Nitrite, UA: NEGATIVE
Protein, UA: NEGATIVE
Spec Grav, UA: 1.025 (ref 1.010–1.025)
Urobilinogen, UA: NEGATIVE E.U./dL — AB
pH, UA: 5 (ref 5.0–8.0)

## 2019-10-01 LAB — URINALYSIS, ROUTINE W REFLEX MICROSCOPIC
Bacteria, UA: NONE SEEN
Bilirubin Urine: NEGATIVE
Glucose, UA: NEGATIVE mg/dL
Ketones, ur: NEGATIVE mg/dL
Leukocytes,Ua: NEGATIVE
Nitrite: NEGATIVE
Protein, ur: NEGATIVE mg/dL
Specific Gravity, Urine: 1.013 (ref 1.005–1.030)
pH: 5 (ref 5.0–8.0)

## 2019-10-01 NOTE — Progress Notes (Signed)
Subjective: 1. Hematuria, unspecified type   2. Microhematuria      [name] is a 79 yo WM who is sent in consultation by Dr. Durene James for microhematuria on a UA on 12/8.  He had 3-6 RBC's on a repeat on 07/15/20.  He has had no gross hematuria.  He has had no prior GU history other than an orchiopexy and he has right atrophy.   His IPSS is 5 with his only real complaint being a weak stream.  He is on warfarin for afib since last February.    His INR was 2.0 in December and 3.2 on 08/23/19.   ROS:  Review of Systems  HENT: Positive for congestion.   All other systems reviewed and are negative.   No Known Allergies  Past Medical History:  Diagnosis Date  . Allergy   . Atrial fibrillation ([facility])   . CAD (coronary artery disease)   . Cataract   . Depression   . Hyperlipidemia   . Hypertension   . Insomnia   . Sinus congestion     Past Surgical History:  Procedure Laterality Date  . APPENDECTOMY    . CATARACT EXTRACTION W/PHACO Right 07/08/2013   Procedure: CATARACT EXTRACTION PHACO AND INTRAOCULAR LENS PLACEMENT ([facility]);  Surgeon: Martin Payor, MD;  Location: [facility];  Service: Ophthalmology;  Laterality: Right;  CDE 26.34  . CORONARY ANGIOPLASTY     stents x4  . ROTATOR CUFF REPAIR Right   . undescended testicle surgery      Social History   Socioeconomic History  . Marital status: Married    Spouse name: Not on file  . Number of children: 2  . Years of education: Not on file  . Highest education level: Not on file  Occupational History  . Occupation: retired  Tobacco Use  . Smoking status: Former Smoker    Packs/day: 0.25    Years: 30.00    Pack years: 7.50    Types: Cigarettes    Quit date: 08/05/1990    Years since quitting: 29.1  . Smokeless tobacco: Never Used  Substance and Sexual Activity  . Alcohol use: Yes    Alcohol/week: 2.0 standard drinks    Types: 1 Glasses of wine, 1 Cans of beer per week    Comment: occassional  . Drug use: No  . Sexual activity:  Yes    Birth control/protection: None  Other Topics Concern  . Not on file  Social History Narrative   Married 1964. 2 kids-son, daughter. 5 grandkids.       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 Difficulty of Paying Living Expenses: Not on file  Food Insecurity:   . Worried About Programme researcher, broadcasting/film/video in the Last Year: Not on file  . Ran Out of Food in the Last Year: Not on file  Transportation Needs:   . Lack of Transportation (Medical): Not on file  . Lack of Transportation (Non-Medical): Not on file  Physical Activity:   . Days of Exercise per Week: Not on file  . Minutes of Exercise per Session: Not on file  Stress:   . Feeling of Stress : Not on file  Social Connections:   . Frequency of Communication with Friends and Family: Not on file  . Frequency of Social Gatherings with Friends and Family: Not on file  . Attends Religious  Services: Not on file  . Active Member of Clubs or Organizations: Not on file  . Attends Archivist Meetings: Not on file  . Marital Status: Not on file  Intimate Partner Violence:   . Fear of Current or Ex-Partner: Not on file  . Emotionally Abused: Not on file  . Physically Abused: Not on file  . Sexually Abused: Not on file    Family History  Problem Relation Age of Onset  . Diabetes Father 9  . Stroke Father        smoker  . Colon cancer Neg Hx   . Rectal cancer Neg Hx   . Stomach cancer Neg Hx     Anti-infectives: Anti-infectives (From admission, onward)   None      Current Outpatient Medications  Medication Sig Dispense Refill  . aspirin EC 81 MG tablet Take 81 mg by mouth daily.    Marland Kitchen atorvastatin (LIPITOR) 80 MG tablet Take 1 tablet (80 mg total) by mouth daily. 90 tablet 3  . isosorbide mononitrate (IMDUR) 60 MG 24 hr tablet Take 1 tablet by mouth once daily 90 tablet 0  . metoprolol succinate  (TOPROL-XL) 100 MG 24 hr tablet Take 1 tab (100 mg) by mouth daily 90 tablet 3  . warfarin (COUMADIN) 5 MG tablet Take 1 tablet daily or as directed (Patient taking differently: Take 1 tablet daily or as directed MANAGED BY Martin James) 90 tablet 3   No current facility-administered medications for this visit.     Objective: Vital signs in last 24 hours: @VSRANGES @  Intake/Output from previous day: No intake/output data recorded. Intake/Output this shift: @IOTHISSHIFT @   Physical Exam Vitals reviewed.  Constitutional:      Appearance: Normal appearance.  Cardiovascular:     Rate and Rhythm: Tachycardia present. Rhythm irregular.     Heart sounds: Normal heart sounds.  Pulmonary:     Effort: Pulmonary effort is normal. No respiratory distress.     Breath sounds: Normal breath sounds.  Abdominal:     General: Abdomen is flat.     Palpations: Abdomen is soft.     Tenderness: There is no abdominal tenderness.     Hernia: No hernia is present.  Genitourinary:    Comments: Normal phallus with adequate meatus. Normal scrotum. Right testicle and epididymis atrophied, Left normal AP without lesions. NST without mass. Prostate 2+ and benign.  SV's non-palpable.  Musculoskeletal:     Cervical back: Normal range of motion and neck supple.  Lymphadenopathy:     Cervical: No cervical adenopathy.  Neurological:     Mental Status: He is alert.     Lab Results:  Results for orders placed or performed in visit on 10/01/19 (from the past 24 hour(s))  POCT urinalysis dipstick     Status: Abnormal   Collection Time: 10/01/19 10:09 AM  Result Value Ref Range   Color, UA yellow    Clarity, UA clear    Glucose, UA Negative Negative   Bilirubin, UA neg    Ketones, UA neg    Spec Grav, UA 1.025 1.010 - 1.025   Blood, UA moderate    pH, UA 5.0 5.0 - 8.0   Protein, UA Negative Negative   Urobilinogen, UA negative (A) 0.2 or 1.0 E.U./dL   Nitrite, UA neg    Leukocytes, UA Negative  Negative   Appearance clear    Odor      BMET Cr was 0.98 in 12/20.  PT/INR 3.2 in 1/21.  Studies/Results: No results found.   Assessment/Plan: Microhematuria.   I will have him set up for a CT hematuria study with return for possible cystoscopy.   No orders of the defined types were placed in this encounter.    Orders Placed This Encounter  Procedures  . CT HEMATURIA WORKUP    Standing Status:   Future    Standing Expiration Date:   12/28/2020    Order Specific Question:   Reason for Exam (SYMPTOM  OR DIAGNOSIS REQUIRED)    Answer:   microhematuria    Order Specific Question:   Preferred imaging location?    Answer:   [facility]    Order Specific Question:   Radiology Contrast Protocol - do NOT remove file path    Answer:   [email or web address]  . Urinalysis, Routine w reflex microscopic    Standing Status:   Future    Standing Expiration Date:   09/30/2020  . Basic metabolic panel    Standing Status:   Future    Standing Expiration Date:   09/30/2020  . POCT urinalysis dipstick     Return in about 3 weeks (around 10/22/2019) for With CT results for cystoscopy.  .    CC: Dr. Tana Conch.      Bjorn Pippin 10/01/2019 [phone]

## 2019-10-11 ENCOUNTER — Other Ambulatory Visit: Payer: Self-pay

## 2019-10-11 ENCOUNTER — Ambulatory Visit (INDEPENDENT_AMBULATORY_CARE_PROVIDER_SITE_OTHER): Payer: Medicare Other | Admitting: *Deleted

## 2019-10-11 DIAGNOSIS — I480 Paroxysmal atrial fibrillation: Secondary | ICD-10-CM

## 2019-10-11 DIAGNOSIS — Z5181 Encounter for therapeutic drug level monitoring: Secondary | ICD-10-CM

## 2019-10-11 LAB — POCT INR: INR: 2.9 (ref 2.0–3.0)

## 2019-10-11 NOTE — Patient Instructions (Signed)
Continue warfarin 1 tablet daily except 1/2 tablet on Tuesdays and Fridays Continue greens/salads Recheck INR in 6 weeks 

## 2019-10-22 ENCOUNTER — Other Ambulatory Visit: Payer: Medicare Other | Admitting: Urology

## 2019-10-22 ENCOUNTER — Ambulatory Visit (HOSPITAL_COMMUNITY)
Admission: RE | Admit: 2019-10-22 | Discharge: 2019-10-22 | Disposition: A | Discharge status: Home / Self Care | Payer: Medicare Other | Source: Ambulatory Visit | Attending: Urology | Admitting: Urology

## 2019-10-22 ENCOUNTER — Other Ambulatory Visit: Payer: Self-pay

## 2019-10-22 DIAGNOSIS — R3129 Other microscopic hematuria: Secondary | ICD-10-CM | POA: Diagnosis not present

## 2019-10-22 DIAGNOSIS — K5730 Diverticulosis of large intestine without perforation or abscess without bleeding: Secondary | ICD-10-CM | POA: Diagnosis not present

## 2019-10-22 DIAGNOSIS — N281 Cyst of kidney, acquired: Secondary | ICD-10-CM | POA: Diagnosis not present

## 2019-10-22 LAB — POCT I-STAT CREATININE: Creatinine, Ser: 1 mg/dL (ref 0.61–1.24)

## 2019-10-22 MED ORDER — IOHEXOL 300 MG/ML  SOLN
125.0000 mL | Freq: Once | INTRAMUSCULAR | Status: AC | PRN
  Administered 2019-10-22: 125 mL via INTRAVENOUS

## 2019-10-25 ENCOUNTER — Telehealth: Payer: Self-pay

## 2019-10-25 NOTE — Telephone Encounter (Signed)
-----   Message from Bjorn Pippin, MD sent at 10/25/2019  1:25 PM EDT ----- CT shows no cause for hematuria.  He will need to f/u as planned.   A few none GU findings are noted but none are of immediate concern and will be discussed at f/u.

## 2019-10-25 NOTE — Telephone Encounter (Signed)
Pt.notified

## 2019-11-11 NOTE — Progress Notes (Signed)
Subjective: 1. Hematuria, unspecified type   2. BPH with urinary obstruction   3. Pulmonary nodule less than 1 cm in diameter with low risk for malignant neoplasm      [name] returns today with the results of a CT for possible cystoscopy for evaluation of microhematuria.  He has 1+ blood on his UA today.  The CT showed BPH but no other GU findings.   CT IMPRESSION: 1. No findings to account for the patient's history of microscopic hematuria. Specifically, no urinary tract calculi no findings of urinary tract obstruction are noted at this time. 2. 3.8 cm peripelvic cyst in the interpolar region of the right kidney. 3. 5 mm subpleural pulmonary nodule in the right lower lobe. This is nonspecific, but statistically likely benign. No follow-up needed if patient is low-risk. Non-contrast chest CT can be considered in 12 months if patient is high-risk. This recommendation follows the consensus statement: Guidelines for Management of Incidental Pulmonary Nodules Detected on CT Images: From the Fleischner Society 2017; Radiology 2017; 284:228-243. 4. Colonic diverticulosis without evidence of acute diverticulitis at this time. 5. Evidence of interstitial pulmonary edema in the visualize lung bases. 6. Aortic atherosclerosis, in addition to at least 2 vessel coronary artery disease. Assessment for potential risk factor modification, dietary therapy or pharmacologic therapy may be warranted, if clinically indicated. 7. There are calcifications of the aortic valve. Echocardiographic correlation for evaluation of potential valvular dysfunction may be warranted if clinically indicated.   [name] is a 79 yo WM who is sent in consultation by Dr. Durene Cal for microhematuria on a UA on 12/8.  He had 3-6 RBC's on a repeat on 07/15/20.  He has had no gross hematuria.  He has had no prior GU history other than an orchiopexy and he has right atrophy.   His IPSS is 5 with his only real complaint  being a weak stream.  He is on warfarin for afib since last February.    His INR was 2.0 in December and 3.2 on 08/23/19.   ROS:  Review of Systems  HENT: Positive for congestion.   Genitourinary:       Nocturia   All other systems reviewed and are negative.   No Known Allergies  Past Medical History:  Diagnosis Date  . Allergy   . Atrial fibrillation (HCC)   . CAD (coronary artery disease)   . Cataract   . Depression   . Hyperlipidemia   . Hypertension   . Insomnia   . Sinus congestion     Past Surgical History:  Procedure Laterality Date  . APPENDECTOMY    . CATARACT EXTRACTION W/PHACO Right 07/08/2013   Procedure: CATARACT EXTRACTION PHACO AND INTRAOCULAR LENS PLACEMENT (IOC);  Surgeon: Gemma Payor, MD;  Location: [facility];  Service: Ophthalmology;  Laterality: Right;  CDE 26.34  . CORONARY ANGIOPLASTY     stents x4  . ROTATOR CUFF REPAIR Right   . undescended testicle surgery      Social History   Socioeconomic History  . Marital status: Married    Spouse name: Not on file  . Number of children: 2  . Years of education: Not on file  . Highest education level: Not on file  Occupational History  . Occupation: retired  Tobacco Use  . Smoking status: Former Smoker    Packs/day: 0.25    Years: 30.00    Pack years: 7.50    Types: Cigarettes    Quit date: 08/05/1990    Years  since quitting: 29.2  . Smokeless tobacco: Never Used  Substance and Sexual Activity  . Alcohol use: Yes    Alcohol/week: 2.0 standard drinks    Types: 1 Glasses of wine, 1 Cans of beer per week    Comment: occassional  . Drug use: No  . Sexual activity: Yes    Birth control/protection: None  Other Topics Concern  . Not on file  Social History Narrative   Married 1964. 2 kids-son, daughter. 5 grandkids.       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 Difficulty of Paying  Living Expenses:   Food Insecurity:   . Worried About Programme researcher, broadcasting/film/video in the Last Year:   . Barista in the Last Year:   Transportation Needs:   . Freight forwarder (Medical):   Marland Kitchen Lack of Transportation (Non-Medical):   Physical Activity:   . Days of Exercise per Week:   . Minutes of Exercise per Session:   Stress:   . Feeling of Stress :   Social Connections:   . Frequency of Communication with Friends and Family:   . Frequency of Social Gatherings with Friends and Family:   . Attends Religious Services:   . Active Member of Clubs or Organizations:   . Attends Banker Meetings:   Marland Kitchen Marital Status:   Intimate Partner Violence:   . Fear of Current or Ex-Partner:   . Emotionally Abused:   Marland Kitchen Physically Abused:   . Sexually Abused:     Family History  Problem Relation Age of Onset  . Diabetes Father 7  . Stroke Father        smoker  . Colon cancer Neg Hx   . Rectal cancer Neg Hx   . Stomach cancer Neg Hx     Anti-infectives: Anti-infectives (From admission, onward)   Start     Dose/Rate Route Frequency Ordered Stop   11/12/19 0945  CIPROFLOXACIN HCL 500 MG PO TABS     500 mg Oral  Once 11/12/19 8366        Current Outpatient Medications  Medication Sig Dispense Refill  . aspirin EC 81 MG tablet Take 81 mg by mouth daily.    Marland Kitchen atorvastatin (LIPITOR) 80 MG tablet Take 1 tablet (80 mg total) by mouth daily. 90 tablet 3  . isosorbide mononitrate (IMDUR) 60 MG 24 hr tablet Take 1 tablet by mouth once daily 90 tablet 0  . metoprolol succinate (TOPROL-XL) 100 MG 24 hr tablet Take 1 tab (100 mg) by mouth daily 90 tablet 3  . warfarin (COUMADIN) 5 MG tablet Take 1 tablet daily or as directed (Patient taking differently: Take 1 tablet daily or as directed MANAGED BY LISA) 90 tablet 3   Current Facility-Administered Medications  Medication Dose Route Frequency Provider Last Rate Last Admin  . ciprofloxacin (CIPRO) tablet 500 mg  500 mg Oral Once  Bjorn Pippin, MD         Objective: Vital signs in last 24 hours: BP 125/82   Pulse 83   Temp (!) 97.5 F (36.4 C)   Ht 5\' 9"  (1.753 m)   Wt 151 lb (68.5 kg)   BMI 22.30 kg/m       Physical Exam  Lab Results:  Results for orders placed or performed in visit on 11/12/19 (from the past 24 hour(s))  POCT urinalysis dipstick     Status: None   Collection Time: 11/12/19  8:59 AM  Result Value Ref Range   Color, UA yellow    Clarity, UA     Glucose, UA Negative Negative   Bilirubin, UA neg    Ketones, UA neg    Spec Grav, UA 1.010 1.010 - 1.025   Blood, UA +    pH, UA 5.0 5.0 - 8.0   Protein, UA Negative Negative   Urobilinogen, UA 0.2 0.2 or 1.0 E.U./dL   Nitrite, UA neg    Leukocytes, UA Negative Negative   Appearance clear    Odor      BMET Cr was 0.98 in 12/20.  PT/INR 3.2 in 1/21.     Studies/Results: No results found.  Procedure: Cystoscopy.  Preop diagnosis: Microhematuria.  Postop diagnosis: Same with BPH and bladder outlet obstruction.  Surgeon: Dr. Irine Seal.  Anesthesia: Local.  Complications: None.  Indications: The patient is a 79 year old male with microhematuria and a negative CT he is to undergo cystoscopy.  Procedure: He was prepped with Betadine solution and draped.  His urethra was instilled with 10 mL of 2% lidocaine jelly.  The flexible cystoscope was then passed.  Examination demonstrated a normal urethra.  The external sphincter was intact.  The prostatic urethra was approximately 4 cm in length with bilobar hyperplasia and obstruction.  There was some inflammatory polyps of the prostatic urethra.  The bladder wall had moderate trabeculation.  No mucosal lesions or stones were noted.  Ureteral orifices were unremarkable.  He was given Cipro to cover the procedure.  There were no complications. Assessment/Plan: Microhematuria.   His work up is negative except for BPH.  Lung nodule.  I will message Dr. Yong Channel to arrange a CT chest in  one year.      Meds ordered this encounter  Medications  . ciprofloxacin (CIPRO) tablet 500 mg     Orders Placed This Encounter  Procedures  . POCT urinalysis dipstick     Return if symptoms worsen or fail to improve.    CC: Dr. Garret Reddish.      Irine Seal 11/12/2019 [phone]

## 2019-11-12 ENCOUNTER — Telehealth: Payer: Self-pay

## 2019-11-12 ENCOUNTER — Encounter: Payer: Self-pay | Admitting: Family Medicine

## 2019-11-12 ENCOUNTER — Encounter: Payer: Self-pay | Admitting: Urology

## 2019-11-12 ENCOUNTER — Ambulatory Visit: Payer: Medicare Other | Admitting: Urology

## 2019-11-12 ENCOUNTER — Other Ambulatory Visit: Payer: Self-pay

## 2019-11-12 VITALS — BP 125/82 | HR 83 | Temp 97.5°F | Ht 69.0 in | Wt 151.0 lb

## 2019-11-12 DIAGNOSIS — R911 Solitary pulmonary nodule: Secondary | ICD-10-CM | POA: Diagnosis not present

## 2019-11-12 DIAGNOSIS — Z9189 Other specified personal risk factors, not elsewhere classified: Secondary | ICD-10-CM | POA: Diagnosis not present

## 2019-11-12 DIAGNOSIS — N401 Enlarged prostate with lower urinary tract symptoms: Secondary | ICD-10-CM | POA: Diagnosis not present

## 2019-11-12 DIAGNOSIS — R319 Hematuria, unspecified: Secondary | ICD-10-CM | POA: Diagnosis not present

## 2019-11-12 DIAGNOSIS — N138 Other obstructive and reflux uropathy: Secondary | ICD-10-CM

## 2019-11-12 LAB — POCT URINALYSIS DIPSTICK
Bilirubin, UA: NEGATIVE
Glucose, UA: NEGATIVE
Ketones, UA: NEGATIVE
Leukocytes, UA: NEGATIVE
Nitrite, UA: NEGATIVE
Protein, UA: NEGATIVE
Spec Grav, UA: 1.01 (ref 1.010–1.025)
Urobilinogen, UA: 0.2 E.U./dL
pH, UA: 5 (ref 5.0–8.0)

## 2019-11-12 MED ORDER — CIPROFLOXACIN HCL 500 MG PO TABS
500.0000 mg | ORAL_TABLET | Freq: Once | ORAL | Status: AC
  Administered 2019-11-12: 500 mg via ORAL

## 2019-11-15 ENCOUNTER — Ambulatory Visit: Payer: Medicare Other | Admitting: Family Medicine

## 2019-11-15 ENCOUNTER — Encounter: Payer: Self-pay | Admitting: Family Medicine

## 2019-11-15 ENCOUNTER — Other Ambulatory Visit: Payer: Self-pay

## 2019-11-15 VITALS — BP 120/60 | HR 88 | Ht 68.0 in | Wt 150.8 lb

## 2019-11-15 DIAGNOSIS — I10 Essential (primary) hypertension: Secondary | ICD-10-CM

## 2019-11-15 DIAGNOSIS — E785 Hyperlipidemia, unspecified: Secondary | ICD-10-CM | POA: Diagnosis not present

## 2019-11-15 DIAGNOSIS — I429 Cardiomyopathy, unspecified: Secondary | ICD-10-CM

## 2019-11-15 DIAGNOSIS — I2510 Atherosclerotic heart disease of native coronary artery without angina pectoris: Secondary | ICD-10-CM | POA: Diagnosis not present

## 2019-11-15 DIAGNOSIS — I480 Paroxysmal atrial fibrillation: Secondary | ICD-10-CM

## 2019-11-15 DIAGNOSIS — I350 Nonrheumatic aortic (valve) stenosis: Secondary | ICD-10-CM | POA: Diagnosis not present

## 2019-11-15 NOTE — Patient Instructions (Signed)

## 2019-11-15 NOTE — Progress Notes (Addendum)
Cardiology Office Note  Date: 11/15/2019   ID: Arrion, Broaddus [birth year], MRN [id number]  PCP:  Marin Olp, MD  Cardiologist:  Kate Sable, MD Electrophysiologist:  None   Chief Complaint: Follow up CAD, AF, HLD, ICM, AS, HTN  History of Present Illness: Martin James is a 79 y.o. male with a history of CAD (Stents x 4),  A Fib (CHADS VASC = 4), HLD,HTN, ICM, AS.  Last encounter with Dr. Bronson Ing via telemedicine 04/13/2019. At that time he was walking 6 miles a day without any complaints of chest pain or shortness of breath. He was taking care of his wife who had recently undergone knee replacement with. He denied any bleeding while taking warfarin. Blood pressure and lipid levels well controlled at that time.  Patient states has been doing very good recently.  He continues to walk 6 to 7 miles daily.  States he recently had a CT of his bladder for microscopic hematuria.  He states CT scan showed no evidence of bleeding source.  He continues on Coumadin and aspirin.  Most recent INR was 2.9.  He has a follow-up with Edrick Oh, RN at Coumadin clinic in the next week or so.  He denies any anginal or exertional symptoms, orthostatic symptoms, CVA or TIA-like symptoms, or frank bleeding in stool or urine.  Denies any claudication-like symptoms, DVT or PE-like symptoms, or lower extremity edema.   Past Medical History:  Diagnosis Date  . Allergy   . Atrial fibrillation ([place])   . CAD (coronary artery disease)   . Cataract   . Depression   . Hyperlipidemia   . Hypertension   . Insomnia   . Sinus congestion     Past Surgical History:  Procedure Laterality Date  . APPENDECTOMY    . CATARACT EXTRACTION W/PHACO Right 07/08/2013   Procedure: CATARACT EXTRACTION PHACO AND INTRAOCULAR LENS PLACEMENT ([place]);  Surgeon: Tonny Branch, MD;  Location: [facility];  Service: Ophthalmology;  Laterality: Right;  CDE 26.34  . CORONARY ANGIOPLASTY     stents x4  . ROTATOR CUFF REPAIR  Right   . undescended testicle surgery      Current Outpatient Medications  Medication Sig Dispense Refill  . aspirin EC 81 MG tablet Take 81 mg by mouth daily.    Marland Kitchen atorvastatin (LIPITOR) 80 MG tablet Take 1 tablet (80 mg total) by mouth daily. 90 tablet 3  . isosorbide mononitrate (IMDUR) 60 MG 24 hr tablet Take 1 tablet by mouth once daily 90 tablet 0  . metoprolol succinate (TOPROL-XL) 100 MG 24 hr tablet Take 1 tab (100 mg) by mouth daily 90 tablet 3  . warfarin (COUMADIN) 5 MG tablet Take 1 tablet daily or as directed (Patient taking differently: Take 1 tablet daily or as directed MANAGED BY LISA) 90 tablet 3   No current facility-administered medications for this visit.   Allergies:  Patient has no known allergies.   Social History: The patient  reports that he quit smoking about 29 years ago. His smoking use included cigarettes. He has a 7.50 pack-year smoking history. He has never used smokeless tobacco. He reports current alcohol use of about 2.0 standard drinks of alcohol per week. He reports that he does not use drugs.   Family History: The patient's family history includes Diabetes (age of onset: 87) in his father; Stroke in his father.   ROS:  Please see the history of present illness. Otherwise, complete review of systems is positive  for none.  All other systems are reviewed and negative.   Physical Exam: VS:  There were no vitals taken for this visit., BMI There is no height or weight on file to calculate BMI.  Wt Readings from Last 3 Encounters:  11/12/19 151 lb (68.5 kg)  10/01/19 152 lb (68.9 kg)  07/13/19 153 lb 12.8 oz (69.8 kg)    General: Patient appears comfortable at rest. Neck: Supple, no elevated JVP or carotid bruits, no thyromegaly. Lungs: Clear to auscultation, nonlabored breathing at rest. Cardiac: Regular rate and rhythm, no S3 or significant systolic murmur, no pericardial rub. Extremities: No pitting edema, distal pulses 2+. Skin: Warm and dry.  Musculoskeletal: No kyphosis. Neuropsychiatric: Alert and oriented x3, affect grossly appropriate.  ECG:  An ECG dated 11/15/2019 was personally reviewed today and demonstrated:  Atrial fibrillation rate of 82 nonspecific ST depression plus T wave abnormality  Recent Labwork: 07/13/2019: ALT 19; AST 26; BUN 18; Hemoglobin 15.0; Platelets 175.0; Potassium 4.5; Sodium 135 10/22/2019: Creatinine, Ser 1.00     Component Value Date/Time   CHOL 127 07/13/2019 1013   TRIG 68.0 07/13/2019 1013   HDL 38.30 (L) 07/13/2019 1013   CHOLHDL 3 07/13/2019 1013   VLDL 13.6 07/13/2019 1013   LDLCALC 75 07/13/2019 1013   LDLDIRECT 60.0 07/10/2017 0905    Other Studies Reviewed Today:  Echo 10/01/18:  1. The left ventricle has moderately reduced systolic function, with an ejection fraction of 35-40%.The cavity size was normal. Left ventricular diastology could not be evaluated secondary to atrial fibrillation. Elevated left ventricular end-diastolic pressure Left ventrical global hypokinesis without regional wall motion abnormalities. 2. Left atrial size was severely dilated. 3. 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 Mild thickening of the mitral valve leaflet. Mitral valve regurgitation is moderateby color flow Doppler. 4. The tricuspid valve is normal in structure. 5. The aortic valve is tricuspid Mild thickening of the aortic valve Mild calcification of the aortic valve. Aortic valve regurgitation is mild by color flow Doppler.Mild-moderate stenosis of the aortic valve. 6. There is mild dilatation of the aortic root. 7. The interatrial septum was not well visualized.  Stress test 10/15/18:   Defect 1: There is a large defect of severe severity present in the basal inferoseptal, basal inferior, mid inferoseptal, mid inferior, apical anterior, apical septal and apical inferior location.  This is a high risk study.  Findings consistent with prior myocardial infarction. No ischemic zones.   Nuclear stress EF: 16%.   Assessment and Plan:  1. CAD in native artery   2. Essential hypertension   3. Hyperlipidemia LDL goal <70   4. Paroxysmal atrial fibrillation (HCC)   5. Aortic valve stenosis, etiology of cardiac valve disease unspecified   6. Cardiomyopathy, unspecified type (HCC)    1. CAD in native artery Previous Hx of stents x 4.  He denies any recent progressive anginal or exertional symptoms.  He walks 6 to 7 miles per day without any symptoms at all.  Continue aspirin 81 mg,  2. Essential hypertension He is normotensive today with a blood pressure of 120/60.  Continue Toprol-XL 100 mg daily  3. Hyperlipidemia LDL goal <70 Lipid panel 07/13/2019 total cholesterol 127, triglycerides 68, HDL 38, LDL 75.  Continue Lipitor 80 mg daily.   4. Paroxysmal atrial fibrillation (HCC) CHA2DS2-VASc 4.  On warfarin.  Recent INR 2.9 on 10/11/2019.  Heart rate today is 82 with good rate control.  Continue Toprol-XL 100 mg daily.  Continue Coumadin as directed.  Follow-up  with Coumadin clinic  5. Aortic valve stenosis, etiology of cardiac valve disease unspecified Mild-moderate stenosis of the aortic valve on echo 09/2018  6. Cardiomyopathy, unspecified type (HCC)  Echo 09/2018 moderately reduced systolic function with an ejection fraction of 35-40%  Medication Adjustments/Labs and Tests Ordered: Current medicines are reviewed at length with the patient today.  Concerns regarding medicines are outlined above.   Disposition: Follow-up with Dr. Purvis Sheffield or APP 6 months.  Signed, Rennis Harding, NP 11/15/2019 6:30 AM    [facility] [address] [place], [place], [place] [address] Phone: [phone]; Fax: [phone]

## 2019-11-16 ENCOUNTER — Other Ambulatory Visit: Payer: Self-pay | Admitting: Family Medicine

## 2019-11-22 ENCOUNTER — Other Ambulatory Visit: Payer: Self-pay

## 2019-11-22 ENCOUNTER — Ambulatory Visit (INDEPENDENT_AMBULATORY_CARE_PROVIDER_SITE_OTHER): Payer: Medicare Other | Admitting: *Deleted

## 2019-11-22 DIAGNOSIS — I480 Paroxysmal atrial fibrillation: Secondary | ICD-10-CM

## 2019-11-22 DIAGNOSIS — Z5181 Encounter for therapeutic drug level monitoring: Secondary | ICD-10-CM

## 2019-11-22 LAB — POCT INR: INR: 3 (ref 2.0–3.0)

## 2019-11-22 NOTE — Patient Instructions (Signed)
Continue warfarin 1 tablet daily except 1/2 tablet on Tuesdays and Fridays Continue greens/salads Recheck INR in 6 weeks 

## 2019-12-17 NOTE — Telephone Encounter (Signed)
Created in error

## 2019-12-20 ENCOUNTER — Encounter: Payer: Self-pay | Admitting: Family Medicine

## 2020-01-04 ENCOUNTER — Other Ambulatory Visit: Payer: Self-pay

## 2020-01-04 ENCOUNTER — Ambulatory Visit (INDEPENDENT_AMBULATORY_CARE_PROVIDER_SITE_OTHER): Payer: Medicare Other | Admitting: *Deleted

## 2020-01-04 DIAGNOSIS — Z5181 Encounter for therapeutic drug level monitoring: Secondary | ICD-10-CM

## 2020-01-04 DIAGNOSIS — I480 Paroxysmal atrial fibrillation: Secondary | ICD-10-CM

## 2020-01-04 LAB — POCT INR: INR: 2.9 (ref 2.0–3.0)

## 2020-01-04 NOTE — Patient Instructions (Signed)
Continue warfarin 1 tablet daily except 1/2 tablet on Tuesdays and Fridays Continue greens/salads Recheck INR in 6 weeks 

## 2020-02-15 ENCOUNTER — Ambulatory Visit (INDEPENDENT_AMBULATORY_CARE_PROVIDER_SITE_OTHER): Payer: Medicare Other | Admitting: *Deleted

## 2020-02-15 DIAGNOSIS — Z5181 Encounter for therapeutic drug level monitoring: Secondary | ICD-10-CM

## 2020-02-15 DIAGNOSIS — I480 Paroxysmal atrial fibrillation: Secondary | ICD-10-CM

## 2020-02-15 LAB — POCT INR: INR: 2.2 (ref 2.0–3.0)

## 2020-02-15 NOTE — Patient Instructions (Signed)
Continue warfarin 1 tablet daily except 1/2 tablet on Tuesdays and Fridays Continue greens/salads Recheck INR in 6 weeks

## 2020-02-26 ENCOUNTER — Other Ambulatory Visit: Payer: Self-pay | Admitting: Family Medicine

## 2020-03-10 ENCOUNTER — Telehealth: Payer: Self-pay | Admitting: Family Medicine

## 2020-03-10 NOTE — Progress Notes (Signed)
  Chronic Care Management   Outreach Note  03/10/2020 Name: Martin James MRN: [id number] DOB: [birth year]  Referred by: [name], MD Reason for referral : No chief complaint on file.   An unsuccessful telephone outreach was attempted today. The patient was referred to the pharmacist for assistance with care management and care coordination.   Follow Up Plan:   [name] Upstream Scheduler

## 2020-03-24 ENCOUNTER — Telehealth: Payer: Self-pay | Admitting: Family Medicine

## 2020-03-24 NOTE — Progress Notes (Signed)
°  Chronic Care Management   Outreach Note  03/24/2020 Name: Martin James MRN: [id number] DOB: 04/10/1941  Referred by: [name], MD Reason for referral : No chief complaint on file.   An unsuccessful telephone outreach was attempted today. The patient was referred to the pharmacist for assistance with care management and care coordination.   Follow Up Plan:   [name] Upstream Scheduler

## 2020-03-28 ENCOUNTER — Ambulatory Visit (INDEPENDENT_AMBULATORY_CARE_PROVIDER_SITE_OTHER): Payer: Medicare Other | Admitting: *Deleted

## 2020-03-28 DIAGNOSIS — Z5181 Encounter for therapeutic drug level monitoring: Secondary | ICD-10-CM | POA: Diagnosis not present

## 2020-03-28 DIAGNOSIS — I480 Paroxysmal atrial fibrillation: Secondary | ICD-10-CM

## 2020-03-28 LAB — POCT INR: INR: 3.6 — AB (ref 2.0–3.0)

## 2020-03-28 MED ORDER — WARFARIN SODIUM 5 MG PO TABS: ORAL_TABLET | ORAL | 3 refills | Status: DC

## 2020-03-28 NOTE — Addendum Note (Signed)
Addended by: Louanna Raw on: 03/28/2020 04:29 PM   Modules accepted: Orders

## 2020-03-28 NOTE — Patient Instructions (Signed)
Hold warfarin tonight then decrease dose to 1 tablet daily except 1/2 tablet on Tuesdays, Thursdays and Saturdays Continue greens/salads Recheck INR in 4 weeks

## 2020-04-04 ENCOUNTER — Telehealth: Payer: Self-pay | Admitting: Family Medicine

## 2020-04-04 NOTE — Progress Notes (Signed)
  Chronic Care Management   Outreach Note  04/04/2020 Name: SAVIAN MAZON MRN: [id number] DOB: [birth year]  Referred by: [name], MD Reason for referral : No chief complaint on file.   An unsuccessful telephone outreach was attempted today. The patient was referred to the pharmacist for assistance with care management and care coordination.   Follow Up Plan:   [name] Upstream Scheduler

## 2020-04-25 ENCOUNTER — Ambulatory Visit (INDEPENDENT_AMBULATORY_CARE_PROVIDER_SITE_OTHER): Payer: Medicare Other | Admitting: *Deleted

## 2020-04-25 DIAGNOSIS — Z5181 Encounter for therapeutic drug level monitoring: Secondary | ICD-10-CM | POA: Diagnosis not present

## 2020-04-25 DIAGNOSIS — I480 Paroxysmal atrial fibrillation: Secondary | ICD-10-CM

## 2020-04-25 LAB — POCT INR: INR: 3.2 — AB (ref 2.0–3.0)

## 2020-04-25 NOTE — Patient Instructions (Signed)
Hold warfarin tonight then decrease dose to 1/2 tablet daily except 1 tablet on Mondays, Wednesdays and Fridays Continue greens/salads Recheck INR in 4 weeks

## 2020-05-11 ENCOUNTER — Encounter: Payer: Self-pay | Admitting: Family Medicine

## 2020-05-17 ENCOUNTER — Encounter (HOSPITAL_COMMUNITY): Payer: Self-pay

## 2020-05-17 ENCOUNTER — Emergency Department (HOSPITAL_COMMUNITY)
Admission: EM | Admit: 2020-05-17 | Discharge: 2020-05-17 | Disposition: A | Discharge status: Home / Self Care | Payer: Medicare Other | Attending: Emergency Medicine | Admitting: Emergency Medicine

## 2020-05-17 ENCOUNTER — Emergency Department (HOSPITAL_COMMUNITY): Payer: Medicare Other

## 2020-05-17 ENCOUNTER — Other Ambulatory Visit: Payer: Self-pay

## 2020-05-17 ENCOUNTER — Telehealth: Payer: Self-pay

## 2020-05-17 DIAGNOSIS — Z955 Presence of coronary angioplasty implant and graft: Secondary | ICD-10-CM | POA: Diagnosis not present

## 2020-05-17 DIAGNOSIS — Z79899 Other long term (current) drug therapy: Secondary | ICD-10-CM | POA: Insufficient documentation

## 2020-05-17 DIAGNOSIS — Z20822 Contact with and (suspected) exposure to covid-19: Secondary | ICD-10-CM | POA: Diagnosis not present

## 2020-05-17 DIAGNOSIS — I4891 Unspecified atrial fibrillation: Secondary | ICD-10-CM | POA: Insufficient documentation

## 2020-05-17 DIAGNOSIS — I10 Essential (primary) hypertension: Secondary | ICD-10-CM | POA: Diagnosis not present

## 2020-05-17 DIAGNOSIS — R911 Solitary pulmonary nodule: Secondary | ICD-10-CM | POA: Diagnosis not present

## 2020-05-17 DIAGNOSIS — Z87891 Personal history of nicotine dependence: Secondary | ICD-10-CM | POA: Insufficient documentation

## 2020-05-17 DIAGNOSIS — R059 Cough, unspecified: Secondary | ICD-10-CM | POA: Insufficient documentation

## 2020-05-17 DIAGNOSIS — Z7982 Long term (current) use of aspirin: Secondary | ICD-10-CM | POA: Insufficient documentation

## 2020-05-17 DIAGNOSIS — R0602 Shortness of breath: Secondary | ICD-10-CM | POA: Diagnosis not present

## 2020-05-17 DIAGNOSIS — Z7901 Long term (current) use of anticoagulants: Secondary | ICD-10-CM | POA: Insufficient documentation

## 2020-05-17 DIAGNOSIS — R0600 Dyspnea, unspecified: Secondary | ICD-10-CM | POA: Insufficient documentation

## 2020-05-17 DIAGNOSIS — I2510 Atherosclerotic heart disease of native coronary artery without angina pectoris: Secondary | ICD-10-CM | POA: Insufficient documentation

## 2020-05-17 DIAGNOSIS — J90 Pleural effusion, not elsewhere classified: Secondary | ICD-10-CM | POA: Insufficient documentation

## 2020-05-17 LAB — CBC WITH DIFFERENTIAL/PLATELET
Abs Immature Granulocytes: 0.01 10*3/uL (ref 0.00–0.07)
Basophils Absolute: 0.1 10*3/uL (ref 0.0–0.1)
Basophils Relative: 1 %
Eosinophils Absolute: 0.2 10*3/uL (ref 0.0–0.5)
Eosinophils Relative: 3 %
HCT: 42.9 % (ref 39.0–52.0)
Hemoglobin: 14 g/dL (ref 13.0–17.0)
Immature Granulocytes: 0 %
Lymphocytes Relative: 16 %
Lymphs Abs: 1.1 10*3/uL (ref 0.7–4.0)
MCH: 32.2 pg (ref 26.0–34.0)
MCHC: 32.6 g/dL (ref 30.0–36.0)
MCV: 98.6 fL (ref 80.0–100.0)
Monocytes Absolute: 0.6 10*3/uL (ref 0.1–1.0)
Monocytes Relative: 8 %
Neutro Abs: 4.9 10*3/uL (ref 1.7–7.7)
Neutrophils Relative %: 72 %
Platelets: 200 10*3/uL (ref 150–400)
RBC: 4.35 MIL/uL (ref 4.22–5.81)
RDW: 13.8 % (ref 11.5–15.5)
WBC: 6.9 10*3/uL (ref 4.0–10.5)
nRBC: 0 % (ref 0.0–0.2)

## 2020-05-17 LAB — COMPREHENSIVE METABOLIC PANEL
ALT: 25 U/L (ref 0–44)
AST: 35 U/L (ref 15–41)
Albumin: 4 g/dL (ref 3.5–5.0)
Alkaline Phosphatase: 53 U/L (ref 38–126)
Anion gap: 11 (ref 5–15)
BUN: 16 mg/dL (ref 8–23)
CO2: 23 mmol/L (ref 22–32)
Calcium: 9 mg/dL (ref 8.9–10.3)
Chloride: 100 mmol/L (ref 98–111)
Creatinine, Ser: 1.04 mg/dL (ref 0.61–1.24)
GFR, Estimated: >60 mL/min (ref >60)
Glucose, Bld: 103 mg/dL — ABNORMAL HIGH (ref 70–99)
Potassium: 4.2 mmol/L (ref 3.5–5.1)
Sodium: 134 mmol/L — ABNORMAL LOW (ref 135–145)
Total Bilirubin: 1.4 mg/dL — ABNORMAL HIGH (ref 0.3–1.2)
Total Protein: 7.3 g/dL (ref 6.5–8.1)

## 2020-05-17 LAB — RESPIRATORY PANEL BY RT PCR (FLU A&B, COVID)
Influenza A by PCR: NEGATIVE
Influenza B by PCR: NEGATIVE
SARS Coronavirus 2 by RT PCR: NEGATIVE

## 2020-05-17 LAB — TROPONIN I (HIGH SENSITIVITY)
Troponin I (High Sensitivity): 8 ng/L (ref <18)
Troponin I (High Sensitivity): 9 ng/L (ref <18)

## 2020-05-17 MED ORDER — METOPROLOL SUCCINATE ER 50 MG PO TB24: 50.0000 mg | ORAL_TABLET | Freq: Every day | ORAL | 1 refills | Status: DC

## 2020-05-17 MED ORDER — FUROSEMIDE 20 MG PO TABS: 20.0000 mg | ORAL_TABLET | Freq: Every day | ORAL | 0 refills | Status: DC

## 2020-05-17 NOTE — Discharge Instructions (Signed)
Spoke to the cardiologist.  He recommended to take a dose of metoprolol 50 mg in the evening and to add Lasix 20 mg in the morning.  Prescriptions given for both of these medications and sent to your pharmacy.  Make sure you visit your appointment on Tuesday.  All this information will be in the computer for the doctor.

## 2020-05-17 NOTE — Telephone Encounter (Signed)
FYI

## 2020-05-17 NOTE — Telephone Encounter (Signed)
Patient is in ED  Nurse Assessment Nurse: Shon Baton, RN, Patrice Date/Time Lamount Cohen Time): 05/17/2020 9:19:36 AM Confirm and document reason for call. If symptomatic, describe symptoms. ---Caller states her husband having trouble breathing , labored, breathing problems for a week. Breathing more difficult when laying down. Hx A-Fib and heart Failure. No Chest pain. No swelling. Patient speaks in full sentences .Has Congestion. Fully vaccinated against COVID Does the patient have any new or worsening symptoms? ---Yes Will a triage be completed? ---Yes Related visit to physician within the last 2 weeks? ---No Does the PT have any chronic conditions? (i.e. diabetes, asthma, this includes High risk factors for pregnancy, etc.) ---Yes List chronic conditions. ---CHF, A-Fib, HTN, Is this a behavioral health or substance abuse call? ---No Guidelines Guideline Title Affirmed Question Affirmed Notes Nurse Date/Time (Eastern Time) Breathing Difficulty [1] MODERATE difficulty breathing (e.g., speaks in phrases, SOB even at rest, pulse Shon Baton, RN, Rodell Perna 05/17/2020 9:25:24 AM PLEASE NOTE: All timestamps contained within this report are represented as [place] Standard Time. CONFIDENTIALTY NOTICE: This fax transmission is intended only for the addressee. It contains information that is legally privileged, confidential or otherwise protected from use or disclosure. If you are not the intended recipient, you are strictly prohibited from reviewing, disclosing, copying using or disseminating any of this information or taking any action in reliance on or regarding this information. If you have received this fax in error, please notify us immediately by telephone so that we can arrange for its return to [place]. Phone: [phone], Toll-Free: [phone], Fax: [phone] Page: 2 of 2 Call Id: [id number] Guidelines Guideline Title Affirmed Question Affirmed Notes Nurse Date/Time Lamount Cohen Time) 100-120) AND  [2] NEWonset or WORSE than normal Disp. Time Lamount Cohen Time) Disposition Final User 05/17/2020 9:15:33 AM Send to Urgent Queue Gerrie Nordmann 05/17/2020 9:30:33 AM Go to ED Now Yes Shon Baton, RN, Patrice Caller Disagree/Comply Comply Caller Understands Yes PreDisposition Call Doctor Care Advice Given Per Guideline GO TO ED NOW: * You need to be seen in the Emergency Department. NOTE TO TRIAGER - DRIVING: * Another adult should drive. BRING MEDICINES: CALL EMS 911 IF: * Call EMS if you become worse. Referrals GO TO FACILITY OTHER - SPECIFY

## 2020-05-17 NOTE — Telephone Encounter (Signed)
Agree with disposition. 

## 2020-05-17 NOTE — ED Provider Notes (Signed)
[facility] EMERGENCY DEPARTMENT Provider Note   CSN: [id number] Arrival date & time: 05/17/20  1050     History Chief Complaint  Patient presents with  . Shortness of Breath    Deidre AlaClyde H Brew is a 79 y.o. male.  Level 5 caveat for acuity of condition.  Chief complaint dyspnea for several days worse with lying down with a minimal amount of chest pain.  Patient walks 7 miles per day.  He has known history of A. fib and aortic stenosis along with mitral regurg.  He is [facility].  No peripheral edema.  Slight cough.        Past Medical History:  Diagnosis Date  . Allergy   . Atrial fibrillation (HCC)   . CAD (coronary artery disease)   . Cataract   . Depression   . Hyperlipidemia   . Hypertension   . Insomnia   . Sinus congestion     Patient Active Problem List   Diagnosis Date Noted  . Pulmonary nodule 11/12/2019  . Atrial fibrillation (HCC) 09/29/2018  . Encounter for therapeutic drug monitoring 09/29/2018  . Chronic sinusitis 07/10/2017  . Binocular diplopia 12/31/2016  . Right shoulder pain 09/02/2014  . Headache 05/24/2014  . INSOMNIA, PERSISTENT 03/03/2007  . Depression 03/03/2007  . Hyperlipidemia 02/17/2007  . SYNDROME, CARPAL TUNNEL 02/17/2007  . Essential hypertension 02/17/2007  . CAD (coronary artery disease) 02/17/2007  . Fatty liver 02/17/2007    Past Surgical History:  Procedure Laterality Date  . APPENDECTOMY    . CATARACT EXTRACTION W/PHACO Right 07/08/2013   Procedure: CATARACT EXTRACTION PHACO AND INTRAOCULAR LENS PLACEMENT (IOC);  Surgeon: Gemma PayorKerry Hunt, MD;  Location: [facility];  Service: Ophthalmology;  Laterality: Right;  CDE 26.34  . CORONARY ANGIOPLASTY     stents x4  . ROTATOR CUFF REPAIR Right   . undescended testicle surgery         Family History  Problem Relation Age of Onset  . Diabetes Father 1460  . Stroke Father        smoker  . Colon cancer Neg Hx   . Rectal cancer Neg Hx   . Stomach cancer  Neg Hx     Social History   Tobacco Use  . Smoking status: Former Smoker    Packs/day: 0.25    Years: 30.00    Pack years: 7.50    Types: Cigarettes    Quit date: 08/05/1990    Years since quitting: 29.8  . Smokeless tobacco: Never Used  Substance Use Topics  . Alcohol use: Yes    Alcohol/week: 2.0 standard drinks    Types: 1 Glasses of wine, 1 Cans of beer per week    Comment: occassional  . Drug use: No    Home Medications Prior to Admission medications   Medication Sig Start Date End Date Taking? Authorizing Provider  aspirin EC 81 MG tablet Take 81 mg by mouth daily.   Yes [provider]  atorvastatin (LIPITOR) 80 MG tablet Take 1 tablet (80 mg total) by mouth daily. 07/14/19  Yes Shelva MajesticHunter, Stephen O, MD  isosorbide mononitrate (IMDUR) 60 MG 24 hr tablet Take 1 tablet by mouth once daily 02/28/20  Yes Shelva MajesticHunter, Stephen O, MD  loratadine (CLARITIN) 10 MG tablet Take 10 mg by mouth daily as needed for allergies.   Yes [provider]  metoprolol succinate (TOPROL-XL) 100 MG 24 hr tablet Take 1 tab (100 mg) by mouth daily 07/13/19  Yes Hunter,  Aldine Contes, MD  warfarin (COUMADIN) 5 MG tablet Take 1 tablet daily except 1/2 tablet on Tuesdays, Thursdays and Saturdays or as directed Patient taking differently: Take 1 tablet daily except 1/2 tablet on Tuesdays, Thursdays and Saturdays, and Sundays or as directed. Taken with evening meal 03/28/20  Yes Jonelle Sidle, MD  furosemide (LASIX) 20 MG tablet Take 1 tablet (20 mg total) by mouth daily. 05/17/20   Donnetta Hutching, MD  metoprolol succinate (TOPROL XL) 50 MG 24 hr tablet Take 1 tablet (50 mg total) by mouth daily. Take with or immediately following a meal. 05/17/20   Donnetta Hutching, MD    Allergies    Patient has no known allergies.  Review of Systems   Review of Systems  Unable to perform ROS: Acuity of condition    Physical Exam Updated Vital Signs BP 117/84   Pulse 85   Temp 98.3 F (36.8 C) (Oral)   Resp  20   Ht 5' 8.5" (1.74 m)   Wt 68 kg   SpO2 97%   BMI 22.48 kg/m   Physical Exam Vitals and nursing note reviewed.  Constitutional:      Appearance: He is well-developed.     Comments: No acute distress, no obvious dyspnea.  HENT:     Head: Normocephalic and atraumatic.  Eyes:     Conjunctiva/sclera: Conjunctivae normal.  Cardiovascular:     Rate and Rhythm: Tachycardia present.     Comments: Irregularly irregular. Pulmonary:     Effort: Pulmonary effort is normal.     Breath sounds: Normal breath sounds.  Abdominal:     General: Bowel sounds are normal.     Palpations: Abdomen is soft.  Musculoskeletal:        General: Normal range of motion.     Cervical back: Neck supple.  Skin:    General: Skin is warm and dry.  Neurological:     General: No focal deficit present.     Mental Status: He is alert and oriented to person, place, and time.  Psychiatric:        Behavior: Behavior normal.     ED Results / Procedures / Treatments   Labs (all labs ordered are listed, but only abnormal results are displayed) Labs Reviewed  COMPREHENSIVE METABOLIC PANEL - Abnormal; Notable for the following components:      Result Value   Sodium 134 (*)    Glucose, Bld 103 (*)    Total Bilirubin 1.4 (*)    All other components within normal limits  RESPIRATORY PANEL BY RT PCR (FLU A&B, COVID)  CBC WITH DIFFERENTIAL/PLATELET  TROPONIN I (HIGH SENSITIVITY)  TROPONIN I (HIGH SENSITIVITY)    EKG EKG Interpretation  Date/Time:  Wednesday May 17 2020 11:00:35 EDT Ventricular Rate:  132 PR Interval:    QRS Duration: 121 QT Interval:  339 QTC Calculation: 503 R Axis:   55 Text Interpretation: Atrial fibrillation Ventricular preexcitation(WPW) Confirmed by Donnetta Hutching ([id number]) on 05/17/2020 11:13:19 AM Also confirmed by Donnetta Hutching ([id number])  on 05/17/2020 12:46:15 PM Also confirmed by Donnetta Hutching ([id number])  on 05/17/2020 1:26:05 PM Also confirmed by Donnetta Hutching ([id number])  on 05/17/2020  2:36:52 PM   Radiology DG Chest 2 View  Result Date: 05/17/2020 CLINICAL DATA:  Shortness of breath EXAM: CHEST - 2 VIEW COMPARISON:  05/16/2004 FINDINGS: The heart size and mediastinal contours are within normal limits. No mediastinal shift. Atherosclerotic calcification of the aortic knob. Probable calcified granuloma at the left lung base. Mildly  prominent interstitial markings within the lung bases, which may reflect mild edema. Suspect small right-sided pneumothorax along the lateral aspect of the right upper lobe (less than 5%). The visualized skeletal structures are unremarkable. IMPRESSION: 1. Suspect small right-sided pneumothorax along the lateral aspect of the right upper lobe. Superimposed skin fold could also result in this appearance. Consider repeat study after patient repositioning or CT of the chest for further evaluation. 2. Mildly prominent interstitial markings within the lung bases, which may reflect mild edema. These results were called by telephone at the time of interpretation on 05/17/2020 at 12:02 pm to provider [facility] , who verbally acknowledged these results. Electronically Signed   By: Duanne Guess D.O.   On: 05/17/2020 12:03   CT Chest Wo Contrast  Result Date: 05/17/2020 CLINICAL DATA:  Shortness of breath.  Possible pneumothorax. EXAM: CT CHEST WITHOUT CONTRAST TECHNIQUE: Multidetector CT imaging of the chest was performed following the standard protocol without IV contrast. COMPARISON:  Radiograph of same day. FINDINGS: Cardiovascular: Atherosclerosis of thoracic aorta is noted without aneurysm formation. Normal cardiac size. No pericardial effusion. Coronary artery calcifications are noted. Mediastinum/Nodes: Thyroid gland is unremarkable. Calcified left hilar lymph nodes are noted consistent with prior granulomatous disease. Esophagus is unremarkable. Lungs/Pleura: No pneumothorax is noted. Moderate bilateral pleural effusions are noted with minimal adjacent  subsegmental atelectasis in both lung bases. 7 mm subpleural nodule is noted anteriorly in the right upper lobe best seen on image number 77 of series 4. Upper Abdomen: Calcified splenic granulomata are noted. Musculoskeletal: No chest wall mass or suspicious bone lesions identified. IMPRESSION: 1. Moderate bilateral pleural effusions are noted with minimal adjacent subsegmental atelectasis in both lung bases. No pneumothorax is noted. 2. Coronary artery calcifications are noted suggesting coronary artery disease. 3. 7 mm subpleural nodule is noted anteriorly in the right upper lobe. Non-contrast chest CT at 6-12 months is recommended. If the nodule is stable at time of repeat CT, then future CT at 18-24 months (from today's scan) is considered optional for low-risk patients, but is recommended for high-risk patients. This recommendation follows the consensus statement: Guidelines for Management of Incidental Pulmonary Nodules Detected on CT Images: From the Fleischner Society 2017; Radiology 2017; 284:228-243. Aortic Atherosclerosis (ICD10-I70.0). Electronically Signed   By: Lupita Raider M.D.   On: 05/17/2020 14:31    Procedures Procedures (including critical care time)  Medications Ordered in ED Medications - No data to display  ED Course  I have reviewed the triage vital signs and the nursing notes.  Pertinent labs & imaging results that were available during my care of the patient were reviewed by me and considered in my medical decision making (see chart for details).    MDM Rules/Calculators/A&P                          Patient presents with dyspnea and minimal chest pain.  Initially was in rapid A. fib.  Troponin negative x2.  CT scan shows bilateral pleural effusions and a right lung nodule.  Cardiologist reviewed case and noted a history of aortic stenosis and mitral regurg.  His recommendation was to add metoprolol 50 mg at night and Lasix 20 mg in the morning.  These recommendations  were discussed with the patient and his wife.  He has follow-up on Tuesday in [place].  All test results were discussed with the patient and his wife.   CRITICAL CARE Performed by: Donnetta Hutching Total critical care  time: 30 minutes Critical care time was exclusive of separately billable procedures and treating other patients. Critical care was necessary to treat or prevent imminent or life-threatening deterioration. Critical care was time spent personally by me on the following activities: development of treatment plan with patient and/or surrogate as well as nursing, discussions with consultants, evaluation of patient's response to treatment, examination of patient, obtaining history from patient or surrogate, ordering and performing treatments and interventions, ordering and review of laboratory studies, ordering and review of radiographic studies, pulse oximetry and re-evaluation of patient's condition. Final Clinical Impression(s) / ED Diagnoses Final diagnoses:  Dyspnea, unspecified type  Atrial fibrillation, unspecified type (HCC)  Pleural effusion  Nodule of right lung    Rx / DC Orders ED Discharge Orders         Ordered    metoprolol succinate (TOPROL XL) 50 MG 24 hr tablet  Daily       Note to Pharmacy: Take in the evening   05/17/20 1542    furosemide (LASIX) 20 MG tablet  Daily        05/17/20 1542           Donnetta Hutching, MD 05/17/20 1600

## 2020-05-17 NOTE — ED Triage Notes (Signed)
Pt to er, pt states that he is here for sob, states that it is worse when he goes to bed at night, states that he also has some pressure in his chest, pt states that he has lots of mucous.  Pt states that even still he walks about 7 miles a day.  States that standing up makes his sob better, states that it is harder to breath when he lays down.

## 2020-05-20 ENCOUNTER — Other Ambulatory Visit: Payer: Self-pay | Admitting: Family Medicine

## 2020-05-22 NOTE — Progress Notes (Signed)
Cardiology Office Note  Date: 05/23/2020   ID: Martin James, DOB September 17, 1940, MRN [id number]  PCP:  Shelva MajesticHunter, Stephen O, MD  Cardiologist:  No primary care provider on file. Electrophysiologist:  None   Chief Complaint: Follow up CAD, AF, HLD, ICM, AS, HTN  History of Present Illness: Martin James is a 79 y.o. male with a history of CAD (Stents x 4),  A Fib (CHADS VASC = 4), HLD,HTN, ICM, AS.  Last encounter with Dr. Purvis SheffieldKoneswaran via telemedicine 04/13/2019. At that time he was walking 6 miles a day without any complaints of chest pain or shortness of breath. He was taking care of his wife who had recently undergone knee replacement with. He denied any bleeding while taking warfarin. Blood pressure and lipid levels well controlled at that time.  Patient states has been doing very good recently.  He continues to walk 6 to 7 miles daily.  States he recently had a CT of his bladder for microscopic hematuria.  He states CT scan showed no evidence of bleeding source.  He continues on Coumadin and aspirin.  Most recent INR was 2.9.  He has a follow-up with Vashti HeyLisa Reid, RN at Coumadin clinic in the next week or so.  He denies any anginal or exertional symptoms, orthostatic symptoms, CVA or TIA-like symptoms, or frank bleeding in stool or urine.  Denies any claudication-like symptoms, DVT or PE-like symptoms, or lower extremity edema.   Recent presentation to [facility] emergency room on 05/17/2020 for shortness of breath.  Stated had been having dyspnea for several days worse with lying down with minimal lateral chest pain.  Known history of atrial fibrillation and aortic stenosis with mitral regurgitation.  He was in atrial fibrillation with RVR. His Toprol was increased to 50 mg daily at night and Lasix started and 20 mg daily. Troponins were negative x2.  CT scan showed bilateral pleural effusions with right lung nodule.   He is here for hospital follow-up today status post recent visit on 05/17/2020  for shortness of breath with A. fib with RVR.  Heart rate today is 65 blood pressure 102/78.  Patient states he is not having any issues today.  Denies any anginal or exertional symptoms.  He occasionally feels palpitations but they are asymptomatic.  He continues to walk approximately 7 miles each day.  He is tolerating the extra nighttime dose of Toprol XL 50 mg.  Tolerating Lasix well.  States he slept better last night and he slept in a month.  Denies any orthostatic symptoms, CVA or TIA-like symptoms.  Continues to have some mild orthopnea at night with not as significant as prior to presentation to the ED.  Denies any weight gain or lower extremity edema.  No DVT or PE-like symptoms.  He states a while back when seeing Dr. Purvis SheffieldKoneswaran there was mention of possible ambulation.  He states he was never referred for possible a ablation due to medication which converted him back to normal sinus rhythm.  He is wondering if this may be a possibility.  Past Medical History:  Diagnosis Date  . Allergy   . Atrial fibrillation (HCC)   . CAD (coronary artery disease)   . Cataract   . Depression   . Hyperlipidemia   . Hypertension   . Insomnia   . Sinus congestion     Past Surgical History:  Procedure Laterality Date  . APPENDECTOMY    . CATARACT EXTRACTION W/PHACO Right 07/08/2013   Procedure: CATARACT  EXTRACTION PHACO AND INTRAOCULAR LENS PLACEMENT (IOC);  Surgeon: Gemma Payor, MD;  Location: [facility];  Service: Ophthalmology;  Laterality: Right;  CDE 26.34  . CORONARY ANGIOPLASTY     stents x4  . ROTATOR CUFF REPAIR Right   . undescended testicle surgery      Current Outpatient Medications  Medication Sig Dispense Refill  . aspirin EC 81 MG tablet Take 81 mg by mouth daily.    Marland Kitchen atorvastatin (LIPITOR) 80 MG tablet Take 1 tablet (80 mg total) by mouth daily. 90 tablet 3  . furosemide (LASIX) 20 MG tablet Take 1 tablet (20 mg total) by mouth daily. 30 tablet 0  . isosorbide mononitrate (IMDUR) 60  MG 24 hr tablet Take 1 tablet by mouth once daily 90 tablet 0  . loratadine (CLARITIN) 10 MG tablet Take 10 mg by mouth daily as needed for allergies.    . metoprolol succinate (TOPROL-XL) 100 MG 24 hr tablet Take 100 mg by mouth in the morning.    . metoprolol succinate (TOPROL-XL) 50 MG 24 hr tablet Take 50 mg by mouth daily with supper.    . warfarin (COUMADIN) 5 MG tablet Take 1 tablet daily except 1/2 tablet on Tuesdays, Thursdays and Saturdays or as directed (Patient taking differently: Take 1 tablet daily except 1/2 tablet on Tuesdays, Thursdays and Saturdays, and Sundays or as directed. Taken with evening meal) 90 tablet 3   No current facility-administered medications for this visit.   Allergies:  Patient has no known allergies.   Social History: The patient  reports that he quit smoking about 29 years ago. His smoking use included cigarettes. He has a 7.50 pack-year smoking history. He has never used smokeless tobacco. He reports current alcohol use of about 2.0 standard drinks of alcohol per week. He reports that he does not use drugs.   Family History: The patient's family history includes Diabetes (age of onset: 17) in his father; Stroke in his father.   ROS:  Please see the history of present illness. Otherwise, complete review of systems is positive for none.  All other systems are reviewed and negative.   Physical Exam: VS:  BP 102/78   Pulse 65   Ht 5' 8.5" (1.74 m)   Wt 147 lb (66.7 kg)   SpO2 96%   BMI 22.03 kg/m , BMI Body mass index is 22.03 kg/m.  Wt Readings from Last 3 Encounters:  05/23/20 147 lb (66.7 kg)  05/17/20 150 lb (68 kg)  11/15/19 150 lb 12.8 oz (68.4 kg)    General: Patient appears comfortable at rest. Neck: Supple, no elevated JVP or carotid bruits, no thyromegaly. Lungs: Clear to auscultation, nonlabored breathing at rest. Cardiac: Irregularly irregular rate and rhythm, no S3 or significant systolic murmur, no pericardial rub. Extremities: No  pitting edema, distal pulses 2+. Skin: Warm and dry. Musculoskeletal: No kyphosis. Neuropsychiatric: Alert and oriented x3, affect grossly appropriate.  ECG:  An ECG dated 11/15/2019 was personally reviewed today and demonstrated:  Atrial fibrillation rate of 82 nonspecific ST depression plus T wave abnormality  Recent Labwork: 05/17/2020: ALT 25; AST 35; BUN 16; Creatinine, Ser 1.04; Hemoglobin 14.0; Platelets 200; Potassium 4.2; Sodium 134     Component Value Date/Time   CHOL 127 07/13/2019 1013   TRIG 68.0 07/13/2019 1013   HDL 38.30 (L) 07/13/2019 1013   CHOLHDL 3 07/13/2019 1013   VLDL 13.6 07/13/2019 1013   LDLCALC 75 07/13/2019 1013   LDLDIRECT 60.0 07/10/2017 0905  Other Studies Reviewed Today:   CT chest 05/17/2020 IMPRESSION: 1. Moderate bilateral pleural effusions are noted with minimal adjacent subsegmental atelectasis in both lung bases. No pneumothorax is noted. 2. Coronary artery calcifications are noted suggesting coronary artery disease. 3. 7 mm subpleural nodule is noted anteriorly in the right upper lobe. Non-contrast chest CT at 6-12 months is recommended. If the nodule is stable at time of repeat CT, then future CT at 18-24 months (from today's scan) is considered optional for low-risk patients, but is recommended for high-risk patients. This recommendation follows the consensus statement: Guidelines for Management of Incidental Pulmonary Nodules Detected on CT Images: From the Fleischner Society 2017; Radiology 2017; 284:228-243.  Aortic Atherosclerosis (ICD10-I70.0).   Echo 10/01/18: 1. The left ventricle has moderately reduced systolic function, with an ejection fraction of 35-40%.The cavity size was normal. Left ventricular diastology could not be evaluated secondary to atrial fibrillation. Elevated left ventricular end-diastolic pressure Left ventrical global hypokinesis without regional wall motion abnormalities. 2. Left atrial size was  severely dilated. 3. 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 Mild thickening of the mitral valve leaflet. Mitral valve regurgitation is moderateby color flow Doppler. 4. The tricuspid valve is normal in structure. 5. The aortic valve is tricuspid Mild thickening of the aortic valve Mild calcification of the aortic valve. Aortic valve regurgitation is mild by color flow Doppler.Mild-moderate stenosis of the aortic valve. 6. There is mild dilatation of the aortic root. 7. The interatrial septum was not well visualized.  Stress test 10/15/18:  Defect 1: There is a large defect of severe severity present in the basal inferoseptal, basal inferior, mid inferoseptal, mid inferior, apical anterior, apical septal and apical inferior location.  This is a high risk study.  Findings consistent with prior myocardial infarction. No ischemic zones.  Nuclear stress EF: 16%.   Assessment and Plan:  1. Shortness of breath   2. Orthopnea   3. CAD in native artery   4. Essential hypertension   5. Mixed hyperlipidemia   6. Atrial fibrillation, unspecified type (HCC)   7. Aortic valve stenosis, etiology of cardiac valve disease unspecified   8. Cardiomyopathy, unspecified type (HCC)    1. Shortness of breath  Recent ED visit for c/o dyspnea for several days worse when lying down. Hx of ischemic cardiomyopathy, atrial fibrillation, aortic stenosis, mitral regurgitation.  Get a repeat echo  2. Atrial fibrillation, unspecified type (HCC) CHA2DS2-VASc 4.  On warfarin.  Recent INR 2.9 on 10/11/2019.   Recent ED presentation  with Afib RVR rate of 132 on EKG.  Cardiology recommended adding metoprolol 50 mg pm and Lasix 20 mg am. Continue Toprol-XL 100 mg daily in a.m. and 50 mg p.m.  Continue Coumadin as directed.  Follow-up with Coumadin clinic.  Patient stated previously RF ablation might have been an option a while back when he was seeing Dr. Purvis Sheffield.  He was wondering if this was an option.  Will refer to  atrial fibrillation clinic.  Heart rate is well controlled today at 65 bpm.  3. Orthopnea Becomes SOB when lying flat in bed per recent ED visit for SOB on 05/17/2020.  States that orthopnea is a little better but he still has some symptoms of orthopnea.  States he has slept the best last night in quite some time since the emergency room visit recently and the addition of Toprol 50 at night and Lasix 20 mg daily.   4. CAD in native artery Previous Hx of stents x 4.  He denies  any recent progressive anginal or exertional symptoms.  He walks 6 to 7 miles per day without any symptoms at all.  Continue aspirin 81 mg,  4. Essential hypertension Blood pressure well controlled today at 102/78.  Continue Toprol-XL 100 mg daily a.m. and 50 mg in p.m.  6. Hyperlipidemia LDL goal <70 Lipid panel 07/13/2019 total cholesterol 127, triglycerides 68, HDL 38, LDL 75.  Continue Lipitor 80 mg daily.   7. Aortic valve stenosis, etiology of cardiac valve disease unspecified Mild-moderate stenosis of the aortic valve on echo 09/2018.  We are repeating echo as noted above in #1.  8. Cardiomyopathy, unspecified type (HCC)  Echo 09/2018 moderately reduced systolic function with an ejection fraction of 35-40%.  Continue Lasix 20 mg daily.  Medication Adjustments/Labs and Tests Ordered: Current medicines are reviewed at length with the patient today.  Concerns regarding medicines are outlined above.   Disposition: Follow-up with Dr. Dr. Diona Browner or APP 1 month Signed, Rennis Harding, NP 05/23/2020 8:46 AM    [facility] [address] [place], [place], [place] [address] Phone: [phone]; Fax: [phone]

## 2020-05-23 ENCOUNTER — Ambulatory Visit (INDEPENDENT_AMBULATORY_CARE_PROVIDER_SITE_OTHER): Payer: Medicare Other | Admitting: *Deleted

## 2020-05-23 ENCOUNTER — Encounter: Payer: Self-pay | Admitting: Family Medicine

## 2020-05-23 ENCOUNTER — Ambulatory Visit: Payer: Medicare Other | Admitting: Family Medicine

## 2020-05-23 VITALS — BP 102/78 | HR 65 | Ht 68.5 in | Wt 147.0 lb

## 2020-05-23 DIAGNOSIS — E782 Mixed hyperlipidemia: Secondary | ICD-10-CM

## 2020-05-23 DIAGNOSIS — R0602 Shortness of breath: Secondary | ICD-10-CM | POA: Diagnosis not present

## 2020-05-23 DIAGNOSIS — I480 Paroxysmal atrial fibrillation: Secondary | ICD-10-CM

## 2020-05-23 DIAGNOSIS — I2510 Atherosclerotic heart disease of native coronary artery without angina pectoris: Secondary | ICD-10-CM

## 2020-05-23 DIAGNOSIS — I10 Essential (primary) hypertension: Secondary | ICD-10-CM

## 2020-05-23 DIAGNOSIS — I4891 Unspecified atrial fibrillation: Secondary | ICD-10-CM

## 2020-05-23 DIAGNOSIS — Z5181 Encounter for therapeutic drug level monitoring: Secondary | ICD-10-CM

## 2020-05-23 DIAGNOSIS — I350 Nonrheumatic aortic (valve) stenosis: Secondary | ICD-10-CM

## 2020-05-23 DIAGNOSIS — R0601 Orthopnea: Secondary | ICD-10-CM

## 2020-05-23 DIAGNOSIS — I429 Cardiomyopathy, unspecified: Secondary | ICD-10-CM

## 2020-05-23 LAB — POCT INR: INR: 2.9 (ref 2.0–3.0)

## 2020-05-23 NOTE — Patient Instructions (Signed)
Continue warfarin 1/2 tablet daily except 1 tablet on Mondays, Wednesdays and Fridays Continue greens/salads Recheck INR in 5 weeks

## 2020-05-23 NOTE — Patient Instructions (Addendum)
Your physician recommends that you schedule a follow-up appointment in: 1 MONTH WITH Nena Polio, NP  Your physician recommends that you continue on your current medications as directed. Please refer to the Current Medication list given to you today.  Your physician has requested that you have an echocardiogram. Echocardiography is a painless test that uses sound waves to create images of your heart. It provides your doctor with information about the size and shape of your heart and how well your heart's chambers and valves are working. This procedure takes approximately one hour. There are no restrictions for this procedure.   Thank you for choosing [facility]!!    You have been referred to:  AFib clinic

## 2020-05-31 ENCOUNTER — Encounter (HOSPITAL_COMMUNITY): Payer: Self-pay | Admitting: Nurse Practitioner

## 2020-05-31 ENCOUNTER — Other Ambulatory Visit: Payer: Self-pay

## 2020-05-31 ENCOUNTER — Ambulatory Visit (HOSPITAL_COMMUNITY)
Admission: RE | Admit: 2020-05-31 | Discharge: 2020-05-31 | Disposition: A | Discharge status: Home / Self Care | Payer: Medicare Other | Source: Ambulatory Visit | Attending: Nurse Practitioner | Admitting: Nurse Practitioner

## 2020-05-31 VITALS — BP 100/58 | Ht 68.5 in | Wt 145.0 lb

## 2020-05-31 DIAGNOSIS — D6869 Other thrombophilia: Secondary | ICD-10-CM | POA: Diagnosis not present

## 2020-05-31 DIAGNOSIS — I429 Cardiomyopathy, unspecified: Secondary | ICD-10-CM | POA: Insufficient documentation

## 2020-05-31 DIAGNOSIS — Z87891 Personal history of nicotine dependence: Secondary | ICD-10-CM | POA: Diagnosis not present

## 2020-05-31 DIAGNOSIS — I110 Hypertensive heart disease with heart failure: Secondary | ICD-10-CM | POA: Diagnosis not present

## 2020-05-31 DIAGNOSIS — I2510 Atherosclerotic heart disease of native coronary artery without angina pectoris: Secondary | ICD-10-CM | POA: Diagnosis not present

## 2020-05-31 DIAGNOSIS — I4811 Longstanding persistent atrial fibrillation: Secondary | ICD-10-CM | POA: Insufficient documentation

## 2020-05-31 DIAGNOSIS — I5022 Chronic systolic (congestive) heart failure: Secondary | ICD-10-CM | POA: Insufficient documentation

## 2020-05-31 DIAGNOSIS — Z7901 Long term (current) use of anticoagulants: Secondary | ICD-10-CM | POA: Diagnosis not present

## 2020-05-31 NOTE — Progress Notes (Signed)
Primary Care Physician: [name], MD Referring Physician: Nena Polio, NP    Martin James is a 79 y.o. male with a h/o long standing persistent  Afib, by review of Ekg's, since  2020, ,HTN, CAD( s/p remote 4 stents placed), CHA2DS2VASc score of at least 4 on warfarin. He is in the afib clinic to discuss rate control on referral of Nena Polio, NP. He had a recent ER visit, 10/13 for shortness of breath, PND, orthopnea and was found to be in afib with RVR with HF. Per pt he was diuresed and felt better, BB dose was increased.Those symptoms have  resolved. He did have f/u with Mardelle Matte and is pending a repeat echo as his last one in 2020 showed EF of 35-40% and severally dilated left atrium. He does walk 7 miles every day and states that he does not feel his afib. In fact, he generally feels very well. Minimal awareness of his afib. He wears a fitbit and most of his HR's  are in the  80's, rarely in the 90's.   Today, he denies symptoms of palpitations, chest pain, shortness of breath, orthopnea, PND, lower extremity edema, dizziness, presyncope, syncope, or neurologic sequela. The patient is tolerating medications without difficulties and is otherwise without complaint today.   Past Medical History:  Diagnosis Date  . Allergy   . Atrial fibrillation (HCC)   . CAD (coronary artery disease)   . Cataract   . Depression   . Hyperlipidemia   . Hypertension   . Insomnia   . Sinus congestion    Past Surgical History:  Procedure Laterality Date  . APPENDECTOMY    . CATARACT EXTRACTION W/PHACO Right 07/08/2013   Procedure: CATARACT EXTRACTION PHACO AND INTRAOCULAR LENS PLACEMENT (IOC);  Surgeon: Gemma Payor, MD;  Location: [facility];  Service: Ophthalmology;  Laterality: Right;  CDE 26.34  . CORONARY ANGIOPLASTY     stents x4  . ROTATOR CUFF REPAIR Right   . undescended testicle surgery      Current Outpatient Medications  Medication Sig Dispense Refill  . aspirin EC 81 MG tablet Take 81 mg  by mouth daily.    Marland Kitchen atorvastatin (LIPITOR) 80 MG tablet Take 1 tablet (80 mg total) by mouth daily. 90 tablet 3  . furosemide (LASIX) 20 MG tablet Take 1 tablet (20 mg total) by mouth daily. 30 tablet 0  . isosorbide mononitrate (IMDUR) 60 MG 24 hr tablet Take 1 tablet by mouth once daily 90 tablet 0  . loratadine (CLARITIN) 10 MG tablet Take 10 mg by mouth daily as needed for allergies.    . metoprolol succinate (TOPROL-XL) 100 MG 24 hr tablet Take 100 mg by mouth in the morning.    . metoprolol succinate (TOPROL-XL) 50 MG 24 hr tablet Take 50 mg by mouth daily with supper.    . warfarin (COUMADIN) 5 MG tablet Take 1 tablet daily except 1/2 tablet on Tuesdays, Thursdays and Saturdays or as directed (Patient taking differently: Take 1 tablet daily except 1/2 tablet on Tuesdays, Thursdays and Saturdays, and Sundays or as directed. Taken with evening meal) 90 tablet 3   No current facility-administered medications for this encounter.    No Known Allergies  Social History   Socioeconomic History  . Marital status: Married    Spouse name: Not on file  . Number of children: 2  . Years of education: Not on file  . Highest education level: Not on file  Occupational History  .  Occupation: retired  Tobacco Use  . Smoking status: Former Smoker    Packs/day: 0.25    Years: 30.00    Pack years: 7.50    Types: Cigarettes    Quit date: 08/05/1990    Years since quitting: 29.8  . Smokeless tobacco: Never Used  Substance and Sexual Activity  . Alcohol use: Yes    Alcohol/week: 2.0 standard drinks    Types: 1 Glasses of wine, 1 Cans of beer per week    Comment: occassional  . Drug use: No  . Sexual activity: Yes    Birth control/protection: None  Other Topics Concern  . Not on file  Social History Narrative   Married 1964. 2 kids-son, daughter. 5 grandkids.       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      Hobbies: walk 8 miles a day, neighborhood Gaffer   Social Determinants of Manufacturing engineer Strain:   . Difficulty of Paying Living Expenses: Not on file  Food Insecurity:   . Worried About Programme researcher, broadcasting/film/video in the Last Year: Not on file  . Ran Out of Food in the Last Year: Not on file  Transportation Needs:   . Lack of Transportation (Medical): Not on file  . Lack of Transportation (Non-Medical): Not on file  Physical Activity:   . Days of Exercise per Week: Not on file  . Minutes of Exercise per Session: Not on file  Stress:   . Feeling of Stress : Not on file  Social Connections:   . Frequency of Communication with Friends and Family: Not on file  . Frequency of Social Gatherings with Friends and Family: Not on file  . Attends Religious Services: Not on file  . Active Member of Clubs or Organizations: Not on file  . Attends Banker Meetings: Not on file  . Marital Status: Not on file  Intimate Partner Violence:   . Fear of Current or Ex-Partner: Not on file  . Emotionally Abused: Not on file  . Physically Abused: Not on file  . Sexually Abused: Not on file    Family History  Problem Relation Age of Onset  . Diabetes Father 35  . Stroke Father        smoker  . Colon cancer Neg Hx   . Rectal cancer Neg Hx   . Stomach cancer Neg Hx     ROS- All systems are reviewed and negative except as per the HPI above  Physical Exam: Vitals:   05/31/20 0926  BP: (!) 100/58  Weight: 65.8 kg  Height: 5' 8.5" (1.74 m)   Wt Readings from Last 3 Encounters:  05/31/20 65.8 kg  05/23/20 66.7 kg  05/17/20 68 kg    Labs: Lab Results  Component Value Date   NA 134 (L) 05/17/2020   K 4.2 05/17/2020   CL 100 05/17/2020   CO2 23 05/17/2020   GLUCOSE 103 (H) 05/17/2020   BUN 16 05/17/2020   CREATININE 1.04 05/17/2020   CALCIUM 9.0 05/17/2020   Lab Results  Component Value Date   INR 2.9 05/23/2020   Lab Results  Component Value Date   CHOL 127 07/13/2019   HDL 38.30 (L) 07/13/2019   LDLCALC 75 07/13/2019   TRIG 68.0  07/13/2019     GEN- The patient is well appearing, alert and oriented x 3 today.   Head- normocephalic, atraumatic Eyes-  Sclera clear, conjunctiva pink Ears- hearing intact Oropharynx- clear Neck- supple, no JVP Lymph-  no cervical lymphadenopathy Lungs- Clear to ausculation bilaterally, normal work of breathing Heart- irregular  rate and rhythm, no murmurs, rubs or gallops, PMI not laterally displaced GI- soft, NT, ND, + BS Extremities- no clubbing, cyanosis, or edema MS- no significant deformity or atrophy Skin- no rash or lesion Psych- euthymic mood, full affect Neuro- strength and sensation are intact  EKG-afib at 91 bpm, qrs int 118 ms, qtc 492 ms   Echo- 2018-1. The left ventricle has moderately reduced systolic function, with an  ejection fraction of 35-40%. The cavity size was normal. Left ventricular  diastology could not be evaluated secondary to atrial fibrillation.  Elevated left ventricular end-diastolic  pressure Left ventrical global hypokinesis without regional wall motion  abnormalities.  2. Left atrial size was severely dilated.  3. 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 Mild thickening of the mitral valve  leaflet. Mitral valve regurgitation is moderate by color flow Doppler.  4. The tricuspid valve is normal in structure.  5. The aortic valve is tricuspid Mild thickening of the aortic valve Mild  calcification of the aortic valve. Aortic valve regurgitation is mild by  color flow Doppler. mild-moderate stenosis of the aortic valve.  6. There is mild dilatation of the aortic root.  7. The interatrial septum was not well visualized.   Assessment and Plan:  1. Longstanding persistent afib He is rate controlled and feels well His metoprolol was increased from 100 mg dally to an additional 50 mg at hs Echo is pending  With his severely dilated left atrium,and long standing persistent afib, it may be very difficult to restore and maintain SR He is not an  ablation candidate for extended time in persistent afib and again, severely dilated left atrium We discussed that use of amiodarone or tikosyn may be able to restore rhythm, but he feels well and does not want to make any changes at this time   2. Cardiomyopathy /chronic systolic HF He feels improved after being diuresed in the ER Normovolemic today  He had not be able to tolerate entresto in the past for low BP Update of Echo  pending, if EF is decreased may warrant LHC with his extensive CAD   3, CAD No chest pain with his daily walking but recent  RVR//HF may be anginal equivalent as he has been living in afib for awhile without this issue  4. CHA2DS2VASc score of at least 4 Continue  Warfarin, INR checked 05/23/20 at  2.9  He has f/u with Nena Polio, PA, 11/16  afib clinic as needed   Elvina Sidle. [facility] [address] [place], [place] [address] [phone]

## 2020-06-01 ENCOUNTER — Telehealth: Payer: Self-pay | Admitting: Family Medicine

## 2020-06-01 NOTE — Telephone Encounter (Signed)
Britta Mccreedy (wife) called stating that since yesterday afternoon patient continues to have shortness of breath with episodes of atrial fib.

## 2020-06-01 NOTE — Telephone Encounter (Signed)
Pt c/o SOB and can't sleep well - says HR is 96 - went to see afib clinic yesterday and discussed possibly pt starting amiodarone but pt declined at that time since he felt fine yesterday - requesting to be seen again by NP - scheduled for tomorrow at 930am

## 2020-06-02 ENCOUNTER — Telehealth: Payer: Self-pay | Admitting: Family Medicine

## 2020-06-02 ENCOUNTER — Ambulatory Visit: Payer: Medicare Other | Admitting: Family Medicine

## 2020-06-02 ENCOUNTER — Encounter: Payer: Self-pay | Admitting: Family Medicine

## 2020-06-02 ENCOUNTER — Encounter: Payer: Self-pay | Admitting: *Deleted

## 2020-06-02 ENCOUNTER — Other Ambulatory Visit: Payer: Self-pay

## 2020-06-02 VITALS — BP 88/60 | HR 91 | Ht 68.5 in | Wt 146.0 lb

## 2020-06-02 DIAGNOSIS — I25118 Atherosclerotic heart disease of native coronary artery with other forms of angina pectoris: Secondary | ICD-10-CM | POA: Diagnosis not present

## 2020-06-02 DIAGNOSIS — I4811 Longstanding persistent atrial fibrillation: Secondary | ICD-10-CM

## 2020-06-02 DIAGNOSIS — R0602 Shortness of breath: Secondary | ICD-10-CM

## 2020-06-02 DIAGNOSIS — R0601 Orthopnea: Secondary | ICD-10-CM

## 2020-06-02 DIAGNOSIS — I350 Nonrheumatic aortic (valve) stenosis: Secondary | ICD-10-CM

## 2020-06-02 DIAGNOSIS — I10 Essential (primary) hypertension: Secondary | ICD-10-CM

## 2020-06-02 DIAGNOSIS — I429 Cardiomyopathy, unspecified: Secondary | ICD-10-CM

## 2020-06-02 DIAGNOSIS — E782 Mixed hyperlipidemia: Secondary | ICD-10-CM

## 2020-06-02 MED ORDER — AMIODARONE HCL 200 MG PO TABS: 200.0000 mg | ORAL_TABLET | Freq: Two times a day (BID) | ORAL | 6 refills | Status: DC

## 2020-06-02 MED ORDER — FUROSEMIDE 20 MG PO TABS
20.0000 mg | ORAL_TABLET | Freq: Every day | ORAL | 3 refills | Status: DC
Start: 2020-06-02 — End: 2021-06-04

## 2020-06-02 NOTE — Telephone Encounter (Signed)
Thank You.

## 2020-06-02 NOTE — Telephone Encounter (Signed)
Appt already made for next week in the [place] office after his Nurse Visit Appt. Placed a note on the appt that the pt is pending a DCCV, that he is on Amiodarone, and send results to Rudi Coco.

## 2020-06-02 NOTE — Telephone Encounter (Signed)
FROM DONNA CARROLL : If you would please get an Ekg in one week there after start of amio, and send to my attention in EPIC. Then I will see back in 2-3 weeks to see about getting scheduled for cardioversion. I assume you are loading 200 mg bid. He will need weekly INR's in preparation for cardioversion in one month( I think I remember he is on warfarin). Please let Vashti Hey know to send results to our attention.

## 2020-06-02 NOTE — Progress Notes (Addendum)
Cardiology Office Note  Date: 06/02/2020   ID: Martin James, DOB 1940-09-18, MRN [id number]  PCP:  Shelva MajesticHunter, Stephen O, MD  Cardiologist:  No primary care provider on file. Electrophysiologist:  None   Chief Complaint: Follow up CAD, AF, HLD, ICM, AS, HTN  History of Present Illness: Martin James is a 79 y.o. male with a history of CAD (Stents x 4),  A Fib (CHADS VASC = 4), HLD,HTN, ICM, AS.  Last encounter with Dr. Purvis SheffieldKoneswaran via telemedicine 04/13/2019. At that time he was walking 6 miles a day without any complaints of chest pain or shortness of breath. He was taking care of his wife who had recently undergone knee replacement with. He denied any bleeding while taking warfarin. Blood pressure and lipid levels well controlled at that time.  Patient states has been doing very good recently.  He continues to walk 6 to 7 miles daily.  States he recently had a CT of his bladder for microscopic hematuria.  He states CT scan showed no evidence of bleeding source.  He continues on Coumadin and aspirin.  Most recent INR was 2.9.  He has a follow-up with Vashti HeyLisa Reid, RN at Coumadin clinic in the next week or so.  He denies any anginal or exertional symptoms, orthostatic symptoms, CVA or TIA-like symptoms, or frank bleeding in stool or urine.  Denies any claudication-like symptoms, DVT or PE-like symptoms, or lower extremity edema.   Recent presentation to [facility] room on 05/17/2020 for shortness of breath.  Stated had been having dyspnea for several days worse with lying down with minimal lateral chest pain.  Known history of atrial fibrillation and aortic stenosis with mitral regurgitation.  He was in atrial fibrillation with RVR. His Toprol was increased to 50 mg daily at night and Lasix started and 20 mg daily. Troponins were negative x2.  CT scan showed bilateral pleural effusions with right lung nodule.   At last visit he was for hospital follow-up  status post recent visit on  05/17/2020 for shortness of breath with A. fib with RVR.  Heart rate today was 65, blood pressure 102/78.  Denied any anginal or exertional symptoms.  He occasionally felt palpitations but they were asymptomatic.  He continues to walk approximately 7 miles each day.  Tolerating the extra nighttime dose of Toprol XL 50 mg.  Tolerating Lasix well.  Denies any orthostatic symptoms, CVA or TIA-like symptoms.  Continue to have some mild orthopnea at night with not as significant as prior to presentation to the ED. denied any weight gain or lower extremity edema.  No DVT or PE-like symptoms.  He stated a while back when seeing Dr. Purvis SheffieldKoneswaran there was mention of possible RF ablation.  He states he was never referred for possible RF ablation due to medication which converted him back to normal sinus rhythm.  He was wondering if this may be a possibility.   He is here today with complaints of not being able to sleep due to uncomfortable sensation in his chest described as fluttering in the evening when he attempts to go to bed.  He denies any issues when he is up and about.  He walks several miles per day.  Denies any shortness of breath currently.  No sensation of palpitations during the day when he is walking around and active.  Recently saw Rudi CocoDonna Carroll at atrial fibrillation clinic.  She offered amiodarone or Tikosyn.  Patient wanted to defer at that time.  Patient's blood pressure is on the low side today with blood pressure of 88/60.  He is asymptomatic.  His EKG shows atrial fibrillation with rate controlled at 82.  He has a pending echocardiogram to recheck LV function.    Past Medical History:  Diagnosis Date  . Allergy   . Atrial fibrillation (HCC)   . CAD (coronary artery disease)   . Cataract   . Depression   . Hyperlipidemia   . Hypertension   . Insomnia   . Sinus congestion     Past Surgical History:  Procedure Laterality Date  . APPENDECTOMY    . CATARACT EXTRACTION W/PHACO Right  07/08/2013   Procedure: CATARACT EXTRACTION PHACO AND INTRAOCULAR LENS PLACEMENT (IOC);  Surgeon: Gemma Payor, MD;  Location: [facility];  Service: Ophthalmology;  Laterality: Right;  CDE 26.34  . CORONARY ANGIOPLASTY     stents x4  . ROTATOR CUFF REPAIR Right   . undescended testicle surgery      Current Outpatient Medications  Medication Sig Dispense Refill  . aspirin EC 81 MG tablet Take 81 mg by mouth daily.    Marland Kitchen atorvastatin (LIPITOR) 80 MG tablet Take 1 tablet (80 mg total) by mouth daily. 90 tablet 3  . furosemide (LASIX) 20 MG tablet Take 1 tablet (20 mg total) by mouth daily. 30 tablet 0  . isosorbide mononitrate (IMDUR) 60 MG 24 hr tablet Take 1 tablet by mouth once daily 90 tablet 0  . loratadine (CLARITIN) 10 MG tablet Take 10 mg by mouth daily as needed for allergies.    . metoprolol succinate (TOPROL-XL) 100 MG 24 hr tablet Take 100 mg by mouth in the morning.    . metoprolol succinate (TOPROL-XL) 50 MG 24 hr tablet Take 50 mg by mouth daily with supper.    . warfarin (COUMADIN) 5 MG tablet Take 1 tablet daily except 1/2 tablet on Tuesdays, Thursdays and Saturdays or as directed (Patient taking differently: Take 1 tablet daily except 1/2 tablet on Tuesdays, Thursdays and Saturdays, and Sundays or as directed. Taken with evening meal) 90 tablet 3   No current facility-administered medications for this visit.   Allergies:  Patient has no known allergies.   Social History: The patient  reports that he quit smoking about 29 years ago. His smoking use included cigarettes. He has a 7.50 pack-year smoking history. He has never used smokeless tobacco. He reports current alcohol use of about 2.0 standard drinks of alcohol per week. He reports that he does not use drugs.   Family History: The patient's family history includes Diabetes (age of onset: 62) in his father; Stroke in his father.   ROS:  Please see the history of present illness. Otherwise, complete review of systems is positive  for none.  All other systems are reviewed and negative.   Physical Exam: VS:  BP (!) 88/60   Pulse 91   Ht 5' 8.5" (1.74 m)   Wt 146 lb (66.2 kg)   SpO2 98%   BMI 21.88 kg/m , BMI Body mass index is 21.88 kg/m.  Wt Readings from Last 3 Encounters:  06/02/20 146 lb (66.2 kg)  05/31/20 145 lb (65.8 kg)  05/23/20 147 lb (66.7 kg)    General: Patient appears comfortable at rest. Neck: Supple, no elevated JVP or carotid bruits, no thyromegaly. Lungs: Clear to auscultation, nonlabored breathing at rest. Cardiac: Irregularly irregular rate and rhythm, no S3 or significant systolic murmur, no pericardial rub. Extremities: No pitting edema, distal pulses 2+.  Skin: Warm and dry. Musculoskeletal: No kyphosis. Neuropsychiatric: Alert and oriented x3, affect grossly appropriate.  ECG:  An ECG dated 11/15/2019 was personally reviewed today and demonstrated:  Atrial fibrillation rate of 82 nonspecific ST depression plus T wave abnormality  Recent Labwork: 05/17/2020: ALT 25; AST 35; BUN 16; Creatinine, Ser 1.04; Hemoglobin 14.0; Platelets 200; Potassium 4.2; Sodium 134     Component Value Date/Time   CHOL 127 07/13/2019 1013   TRIG 68.0 07/13/2019 1013   HDL 38.30 (L) 07/13/2019 1013   CHOLHDL 3 07/13/2019 1013   VLDL 13.6 07/13/2019 1013   LDLCALC 75 07/13/2019 1013   LDLDIRECT 60.0 07/10/2017 0905    Other Studies Reviewed Today:   CT chest 05/17/2020 IMPRESSION: 1. Moderate bilateral pleural effusions are noted with minimal adjacent subsegmental atelectasis in both lung bases. No pneumothorax is noted. 2. Coronary artery calcifications are noted suggesting coronary artery disease. 3. 7 mm subpleural nodule is noted anteriorly in the right upper lobe. Non-contrast chest CT at 6-12 months is recommended. If the nodule is stable at time of repeat CT, then future CT at 18-24 months (from today's scan) is considered optional for low-risk patients, but is recommended for high-risk  patients. This recommendation follows the consensus statement: Guidelines for Management of Incidental Pulmonary Nodules Detected on CT Images: From the Fleischner Society 2017; Radiology 2017; 284:228-243.  Aortic Atherosclerosis (ICD10-I70.0).   Echo 10/01/18: 1. The left ventricle has moderately reduced systolic function, with an ejection fraction of 35-40%.The cavity size was normal. Left ventricular diastology could not be evaluated secondary to atrial fibrillation. Elevated left ventricular end-diastolic pressure Left ventrical global hypokinesis without regional wall motion abnormalities. 2. Left atrial size was severely dilated. 3. 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 Mild thickening of the mitral valve leaflet. Mitral valve regurgitation is moderateby color flow Doppler. 4. The tricuspid valve is normal in structure. 5. The aortic valve is tricuspid Mild thickening of the aortic valve Mild calcification of the aortic valve. Aortic valve regurgitation is mild by color flow Doppler.Mild-moderate stenosis of the aortic valve. 6. There is mild dilatation of the aortic root. 7. The interatrial septum was not well visualized.  Stress test 10/15/18:  Defect 1: There is a large defect of severe severity present in the basal inferoseptal, basal inferior, mid inferoseptal, mid inferior, apical anterior, apical septal and apical inferior location.  This is a high risk study.  Findings consistent with prior myocardial infarction. No ischemic zones.  Nuclear stress EF: 16%.   Assessment and Plan:  1. Longstanding persistent atrial fibrillation (HCC)    1. Shortness of breath Today he denies any shortness of breath.  There is a pending echo to reevaluate LV function, diastolic function, and valvular function.  2. Atrial fibrillation, unspecified type (HCC) CHA2DS2-VASc 4.  On warfarin.  Recently saw Rudi Coco at atrial fibrillation clinic.  She offered to place the patient  on amiodarone or possibly Tikosyn.  He deferred.  Today he presents with continued complaints of unusual feeling in his chest at night when attempting to sleep.  He states it almost feels like restless leg syndrome in his chest.  EKG today shows controlled rate atrial fibrillation with rate of 82.  He is willing to try amiodarone.  Will load with 200 mg p.o. amiodarone twice daily for 1 week.  Refer back to atrial fibrillation clinic in 1 week for management.  We are stopping a.m. dose of metoprolol and continuing metoprolol to 50 mg in the afternoon.   3.  Orthopnea No current complaints of orthopnea.  4. CAD in native artery Previous Hx of stents x 4.  He denies any recent progressive anginal or exertional symptoms.  He walks 6 to 7 miles per day without any symptoms at all.  Continue aspirin 81 mg,  4. Essential hypertension Blood pressure low today at 88/60.  He denies any orthostatic symptoms or shortness of breath/dizziness.  We are stopping a.m. metoprolol and continuing afternoon Toprol 50 mg daily.  6. Hyperlipidemia LDL goal <70 Lipid panel 07/13/2019 total cholesterol 127, triglycerides 68, HDL 38, LDL 75.  Continue Lipitor 80 mg daily.   7. Aortic valve stenosis, etiology of cardiac valve disease unspecified Mild-moderate stenosis of the aortic valve on echo 09/2018.  We are repeating echo as noted above in #1.  8. Cardiomyopathy, unspecified type (HCC)  Echo 09/2018 moderately reduced systolic function with an ejection fraction of 35-40%.  Continue Lasix 20 mg daily.  Medication Adjustments/Labs and Tests Ordered: Current medicines are reviewed at length with the patient today.  Concerns regarding medicines are outlined above.   Disposition: Follow-up with Dr. Dr. Diona Browner or APP 3 months Signed, Rennis Harding, NP 06/02/2020 9:46 AM    [facility] at [facility] [address] [place], [place], [place] [address] Phone: [phone]; Fax: [phone]

## 2020-06-02 NOTE — Patient Instructions (Addendum)
Medication Instructions:   Lasix refilled today for 90 day supply.   Stop the morning dose of the Toprol XL.   Continue with the evening dose of Toprol XL 50mg  every evening.   Begin Amiodarone 200mg  twice a day x 1 week.   Continue all other medications.    Labwork: none  Testing/Procedures: none  Follow-Up: 3 months   Any Other Special Instructions Will Be Listed Below (If Applicable). Afib clinic follow up in the next week.    If you need a refill on your cardiac medications before your next appointment, please call your pharmacy.

## 2020-06-08 ENCOUNTER — Ambulatory Visit (INDEPENDENT_AMBULATORY_CARE_PROVIDER_SITE_OTHER): Payer: Medicare Other | Admitting: *Deleted

## 2020-06-08 ENCOUNTER — Other Ambulatory Visit: Payer: Self-pay

## 2020-06-08 DIAGNOSIS — I4811 Longstanding persistent atrial fibrillation: Secondary | ICD-10-CM | POA: Diagnosis not present

## 2020-06-08 DIAGNOSIS — I480 Paroxysmal atrial fibrillation: Secondary | ICD-10-CM | POA: Diagnosis not present

## 2020-06-08 DIAGNOSIS — Z5181 Encounter for therapeutic drug level monitoring: Secondary | ICD-10-CM | POA: Diagnosis not present

## 2020-06-08 LAB — POCT INR: INR: 2.9 (ref 2.0–3.0)

## 2020-06-08 NOTE — Patient Instructions (Addendum)
Pending DCCV  (1st pre-DCCV) Continue warfarin 1/2 tablet daily except 1 tablet on Mondays, Wednesdays and Fridays Continue greens/salads Started Amiodarone 200mg  bid x 1 month.  On 11/26 decrease to 200mg  daily Recheck INR in 1 week

## 2020-06-08 NOTE — Progress Notes (Signed)
Pt made aware - has appt with afib clinic 11/12

## 2020-06-08 NOTE — Progress Notes (Signed)
Pt here for EKG per 10/29 phone note 1 week after starting amio 200 mg bid EKG done and will forward to provider

## 2020-06-09 ENCOUNTER — Ambulatory Visit: Payer: Medicare Other

## 2020-06-13 ENCOUNTER — Ambulatory Visit (INDEPENDENT_AMBULATORY_CARE_PROVIDER_SITE_OTHER): Payer: Medicare Other

## 2020-06-13 ENCOUNTER — Ambulatory Visit (INDEPENDENT_AMBULATORY_CARE_PROVIDER_SITE_OTHER): Payer: Medicare Other | Admitting: *Deleted

## 2020-06-13 ENCOUNTER — Other Ambulatory Visit: Payer: Self-pay

## 2020-06-13 DIAGNOSIS — Z5181 Encounter for therapeutic drug level monitoring: Secondary | ICD-10-CM

## 2020-06-13 DIAGNOSIS — I480 Paroxysmal atrial fibrillation: Secondary | ICD-10-CM

## 2020-06-13 DIAGNOSIS — R0602 Shortness of breath: Secondary | ICD-10-CM

## 2020-06-13 LAB — ECHOCARDIOGRAM COMPLETE
AR max vel: 1.35 cm2
AV Area VTI: 1.23 cm2
AV Area mean vel: 1.21 cm2
AV Mean grad: 11.5 mmHg
AV Peak grad: 18.6 mmHg
Ao pk vel: 2.16 m/s
Area-P 1/2: 4.61 cm2
Calc EF: 30.7 %
MV M vel: 3.93 m/s
MV Peak grad: 61.7 mmHg
P 1/2 time: 302 msec
S' Lateral: 5.13 cm
Single Plane A2C EF: 33.3 %
Single Plane A4C EF: 25.3 %

## 2020-06-13 LAB — POCT INR: INR: 3.7 — AB (ref 2.0–3.0)

## 2020-06-13 NOTE — Patient Instructions (Signed)
Pending DCCV  (2nd  pre-DCCV) Hold warfarin tonight then decrease dose to 1/2 tablet daily except 1 tablet on Sundays Continue greens/salads Started Amiodarone 200mg  bid x 1 month.  On 11/26 decrease to 200mg  daily Recheck INR in 1 week

## 2020-06-15 ENCOUNTER — Telehealth: Payer: Self-pay | Admitting: *Deleted

## 2020-06-15 NOTE — Telephone Encounter (Signed)
Patient informed and verbalized understanding of plan. Copy sent to PCP 

## 2020-06-15 NOTE — Telephone Encounter (Signed)
-----  Message from Satira Sark, MD sent at 06/15/2020  8:18 AM EST ----- I reviewed the chart.  This is a former patient of Dr. Bronson Ing that I have not met.  There look to be several issues here to consider, I am not clear on the present medication management strategy and why he is on amiodarone at this point with persistent atrial fibrillation and a severely dilated left atrium.  It is unlikely that he is going to maintain sinus rhythm and apparently is not very symptomatic with his atrial fibrillation.  He has had progressive cardiomyopathy, but it sounds like by the recent note he is clinically stable.  Low blood pressures limit medication adjustments.  Please schedule an office visit with me I will try to put together a plan, but I would not necessarily jump to a heart catheterization without further discussion. ----- Message ----- From: Verta Ellen., NP Sent: 06/14/2020  11:35 PM EST To: Laurine Blazer, LPN, Satira Sark, MD  Please call the patient and let him know the echocardiogram shows his pumping function has decreased from previous echocardiogram. He has mild to moderate leaking in his mitral and tricuspid valves. His aortic valve is mildly leaking. He has some mild to moderate aortic valve stenosis/narrowing. I am copying Dr Domenic Polite to get his input on whether the patient may need cardiac catheterization to further assess.

## 2020-06-16 ENCOUNTER — Ambulatory Visit (HOSPITAL_COMMUNITY): Payer: Medicare Other | Admitting: Nurse Practitioner

## 2020-06-19 ENCOUNTER — Telehealth: Payer: Self-pay | Admitting: Cardiology

## 2020-06-19 ENCOUNTER — Encounter: Payer: Self-pay | Admitting: Cardiology

## 2020-06-19 ENCOUNTER — Ambulatory Visit (INDEPENDENT_AMBULATORY_CARE_PROVIDER_SITE_OTHER): Payer: Medicare Other | Admitting: *Deleted

## 2020-06-19 DIAGNOSIS — Z5181 Encounter for therapeutic drug level monitoring: Secondary | ICD-10-CM | POA: Diagnosis not present

## 2020-06-19 DIAGNOSIS — I480 Paroxysmal atrial fibrillation: Secondary | ICD-10-CM

## 2020-06-19 LAB — POCT INR: INR: 2.6 (ref 2.0–3.0)

## 2020-06-19 NOTE — Telephone Encounter (Signed)
New message     Patient's wife called patient is falling asleep in chair , his bp is running 91/58 should he take his daily medication ?

## 2020-06-19 NOTE — Telephone Encounter (Signed)
Pt voiced understanding

## 2020-06-19 NOTE — Telephone Encounter (Signed)
Would hold amiodarone for now and decrease Imdur to 30 mg daily for his next dose.  I will be meeting him in the office tomorrow.

## 2020-06-19 NOTE — Patient Instructions (Signed)
Pending DCCV  (3rd  pre-DCCV) Continue warfarin 1/2 tablet daily except 1 tablet on Sundays Continue greens/salads Started Amiodarone 200mg  bid x 1 month.  On 11/26 decrease to 200mg  daily Recheck INR in 1 week

## 2020-06-19 NOTE — Progress Notes (Signed)
Cardiology Office Note  Date: 06/20/2020   ID: Martin James, DOB 12/27/1940, MRN [id number]  PCP:  Shelva MajesticHunter, Stephen O, MD  Cardiologist:  Nona DellSamuel Tawyna Pellot, MD Electrophysiologist:  None   Chief Complaint  Patient presents with  . Cardiac follow-up    History of Present Illness: Martin James is an 79 y.o. male former patient of Dr. Purvis SheffieldKoneswaran now presenting to establish follow-up with me.  I reviewed extensive records and updated the chart.  He was most recently seen by Mr. Vincenza HewsQuinn NP in October and has been concurrently managed in the atrial fibrillation clinic.  He is here today with his wife.  We discussed his symptoms.  Within the last 3 months he has had some episodes of orthopnea, vague sense of palpitations, but in general his heart rate has been well controlled on medical therapy including Toprol-XL along with Coumadin for stroke prophylaxis.  He does not describe any obvious angina, no leg swelling.  He has been on Lasix for about the last month.  His weight has been stable.  He states that he "cannot sit still" and walks for exercise, states that he walked 5 miles yesterday.  He describes NYHA class II dyspnea.  No palpitations or syncope.  He underwent a recent follow-up echocardiogram that demonstrated further reduction in LVEF, now to the range of 20 to 25%, mildly decreased RV contraction with RVSP 47 mmHg, severe left atrial enlargement, mild to moderate mitral regurgitation, moderate tricuspid regurgitation, and mild to moderate calcific aortic stenosis.  Previous LVEF had been 35 to 40% as of February 2020.  He tells me that he recalls being placed on Entresto last year and did not tolerate even low-dose due to significant weakness and likely in the setting of low blood pressure at that point.  He is on Coumadin with follow-up in the anticoagulation clinic.  CHA2DS2-VASc score is 4.  He was started on amiodarone recently by Mr. Vincenza HewsQuinn NP in anticipation of a potential  cardioversion attempt through the atrial fibrillation clinic.  He called the office stating that he was weak and fatigued, was hypotensive yesterday, blood pressure looks better today.  I reviewed his present medications which are outlined below.  Past Medical History:  Diagnosis Date  . Atrial fibrillation (HCC)   . CAD (coronary artery disease)    BMS x 2 distal RCA 1998; DES x 2 mid to distal RCA 2005, had occluded distal circumflex with left to left collaterals as well  . Cataract   . Depression   . Essential hypertension   . Hyperlipidemia   . Insomnia   . Ischemic cardiomyopathy   . Sinus congestion     Past Surgical History:  Procedure Laterality Date  . APPENDECTOMY    . CATARACT EXTRACTION W/PHACO Right 07/08/2013   Procedure: CATARACT EXTRACTION PHACO AND INTRAOCULAR LENS PLACEMENT (IOC);  Surgeon: Gemma PayorKerry Hunt, MD;  Location: [facility];  Service: Ophthalmology;  Laterality: Right;  CDE 26.34  . CORONARY ANGIOPLASTY     stents x4  . ROTATOR CUFF REPAIR Right   . Undescended testicle surgery      Current Outpatient Medications  Medication Sig Dispense Refill  . aspirin EC 81 MG tablet Take 81 mg by mouth daily.    Marland Kitchen. atorvastatin (LIPITOR) 80 MG tablet Take 1 tablet (80 mg total) by mouth daily. 90 tablet 3  . furosemide (LASIX) 20 MG tablet Take 1 tablet (20 mg total) by mouth daily. 90 tablet 3  . isosorbide mononitrate (  IMDUR) 30 MG 24 hr tablet Take 30 mg by mouth daily.    Marland Kitchen loratadine (CLARITIN) 10 MG tablet Take 10 mg by mouth daily as needed for allergies.    . metoprolol succinate (TOPROL-XL) 50 MG 24 hr tablet Take 50 mg by mouth daily with supper.    . warfarin (COUMADIN) 5 MG tablet Take 1 tablet daily except 1/2 tablet on Tuesdays, Thursdays and Saturdays or as directed (Patient taking differently: Take 1 tablet daily except 1/2 tablet on Tuesdays, Thursdays and Saturdays, and Sundays or as directed. Taken with evening meal) 90 tablet 3  . digoxin (LANOXIN) 0.125  MG tablet Take 1 tablet (0.125 mg total) by mouth daily. 90 tablet 1   No current facility-administered medications for this visit.   Allergies:  Patient has no known allergies.   Social History: The patient  reports that he quit smoking about 29 years ago. His smoking use included cigarettes. He has a 7.50 pack-year smoking history. He has never used smokeless tobacco. He reports current alcohol use of about 2.0 standard drinks of alcohol per week. He reports that he does not use drugs.   Family History: The patient's family history includes Diabetes in his father; Stroke in his father.   ROS: No syncope.  Physical Exam: VS:  BP 104/68   Pulse 61   Ht 5' 6.5" (1.689 m)   Wt 144 lb (65.3 kg)   SpO2 93%   BMI 22.89 kg/m , BMI Body mass index is 22.89 kg/m.  Wt Readings from Last 3 Encounters:  06/20/20 144 lb (65.3 kg)  06/02/20 146 lb (66.2 kg)  05/31/20 145 lb (65.8 kg)    General: Elderly male, appears comfortable at rest. HEENT: Conjunctiva and lids normal, wearing a mask. Neck: Supple, no elevated JVP or carotid bruits, no thyromegaly. Lungs: Clear to auscultation, nonlabored breathing at rest. Cardiac: Irregularly irregular, no S3, 2/6 apical systolic murmur, no pericardial rub. Abdomen: Soft, nontender, bowel sounds present. Extremities: No pitting edema, distal pulses 2+. Skin: Warm and dry. Musculoskeletal: No kyphosis. Neuropsychiatric: Alert and oriented x3, affect grossly appropriate.  ECG:  An ECG dated 06/08/2020 was personally reviewed today and demonstrated:  Course atrial fibrillation/atypical atrial flutter with variable block, increased voltage, diffuse repolarization abnormalities and PVC.  Recent Labwork: 05/17/2020: ALT 25; AST 35; BUN 16; Creatinine, Ser 1.04; Hemoglobin 14.0; Platelets 200; Potassium 4.2; Sodium 134     Component Value Date/Time   CHOL 127 07/13/2019 1013   TRIG 68.0 07/13/2019 1013   HDL 38.30 (L) 07/13/2019 1013   CHOLHDL 3  07/13/2019 1013   VLDL 13.6 07/13/2019 1013   LDLCALC 75 07/13/2019 1013   LDLDIRECT 60.0 07/10/2017 0905    Other Studies Reviewed Today:  Cardiac catheterization 05/16/2004:  1.  Left main is normal.  2.  Left anterior descending artery has a 30% stenosis in the proximal      vessel and diffuse 30% stenosis in the midvessel.  The LAD gives rise to      a large first diagonal branch which also has a 30% stenosis proximally.  3.  Left circumflex has a 20% stenosis in the midvessel.  The circumflex      gives rise to a normal size first obtuse marginal, small second obtuse      marginal, and large third obtuse marginal.  Distal to the third obtuse      marginal, the circumflex is very small and it appears to be chronically  occluded with left-to-left collaterals filling a smaller fourth obtuse      marginal branch.  4.  Right coronary artery has a 40% stenosis in the proximal vessel.  In the      distal vessel just beyond the acute margin, there are overlapping      stents.  The vessel is 100% occluded within the proximal to midportion      of the stents.  The stents do extend into the AV groove where the right      coronary artery crossed the origin of the posterior descending artery      and posterolateral branch.  Within this area the stent is still open,      however, there is a long 70% stenosis within the distal portion of the      stented segment of the right coronary artery.  The distal right coronary      artery which includes a normal size posterior descending artery, two      small posterolateral branches followed by a normal size third      posterolateral branch filled via grade III right-to-right collaterals      arising from an acute marginal branch.  In the ostium of the posterior      descending artery, there appears to be a 50% stenosis.  Echocardiogram 06/13/2020: 1. Left ventricular ejection fraction, by estimation, is 20 to 25%. The  left ventricle has  severely decreased function. The left ventricle  demonstrates regional wall motion abnormalities (see scoring  diagram/findings for description). There is mild  asymmetric left ventricular hypertrophy of the septal segment. Left  ventricular diastolic parameters are indeterminate.  2. Right ventricular systolic function is mildly reduced. The right  ventricular size is normal. There is moderately elevated pulmonary artery  systolic pressure. The estimated right ventricular systolic pressure is  47.4 mmHg.  3. Left atrial size was severely dilated.  4. Right atrial size was moderately dilated.  5. The mitral valve is grossly normal. Mild to moderate mitral valve  regurgitation.  6. Tricuspid valve regurgitation is moderate.  7. The aortic valve is tricuspid. There is moderate calcification of the  aortic valve. Aortic valve regurgitation is mild. Mild to moderate aortic  valve stenosis. Aortic valve mean gradient measures 11.5 mmHg. Aortic  valve Vmax measures 2.16 m/s.  Dimentionless index 0.43.  8. The inferior vena cava is normal in size with greater than 50%  respiratory variability, suggesting right atrial pressure of 3 mmHg.   Comparison(s): Echocardiogram done 10/01/18 showed an EF of 35-40%.   Assessment and Plan:  1.  Progressive cardiomyopathy with underlying ischemic heart disease, possibly overall mixed picture however.  LVEF most recently 20 to 25% with mild RV dysfunction and moderate pulmonary hypertension.  Functional status sounds fairly reasonable based on discussion today, although he has had some intermittent orthopnea, seems to be a little better since being on Lasix.  It does not appear that he has uncontrolled atrial fibrillation as a cause of his cardiomyopathy based on recorded heart rates.  Beta-blocker has been down titrated (given recent attempt at amiodarone load), plan to continue Toprol-XL at 50 mg daily for now.  Start Lanoxin 0.125 mg daily, continue  Imdur and Lasix for now.  He reports prior intolerance of even low-dose Entresto which was attempted last year when his LVEF was 35 to 40%.  He has not been on Aldactone as yet.  Recent systolics 90-100 range.  After discussion of risks and benefits, plan is  to get him scheduled for a right and left heart catheterization with Dr. Gala Romney to better understand coronary anatomy and hemodynamics.  Medications can be adjusted further from there.  2.  Persistent/permanent atrial fibrillation with CHA2DS2-VASc score of 4.  He has a severely dilated left atrium by his most recent echocardiogram and I doubt that he will maintain sinus rhythm long-term.  For now I would hold off on scheduling an attempt at cardioversion, I have asked him to stop amiodarone.  Focus will be on heart rate control and anticoagulation.  3.  Mitral regurgitation, mild to moderate.  4.  CAD status post previous stent interventions to the RCA, most recently in 2005 as discussed above.  He had an occluded distal circumflex at that time with left to left collaterals.  No definite angina on Imdur.  He has remained on low-dose aspirin as well as statin therapy.  Medication Adjustments/Labs and Tests Ordered: Current medicines are reviewed at length with the patient today.  Concerns regarding medicines are outlined above.   Tests Ordered: Orders Placed This Encounter  Procedures  . Basic metabolic panel  . CBC    Medication Changes: Meds ordered this encounter  Medications  . digoxin (LANOXIN) 0.125 MG tablet    Sig: Take 1 tablet (0.125 mg total) by mouth daily.    Dispense:  90 tablet    Refill:  1    06/20/2020 NEW    Disposition:  Follow up 4 to 6 weeks.  Signed, Jonelle Sidle, MD, [facility], [place] 06/20/2020 9:22 AM    [facility] [address] [place], [place], [place] [address] Phone: [phone]; Fax: [phone]

## 2020-06-19 NOTE — H&P (View-Only) (Signed)
  Cardiology Office Note  Date: 06/20/2020   ID: Martin James, DOB 09/13/1940, MRN [id number]  PCP:  Hunter, Stephen O, MD  Cardiologist:  Malli Falotico, MD Electrophysiologist:  None   Chief Complaint  Patient presents with  . Cardiac follow-up    History of Present Illness: Martin James is an 79 y.o. male former patient of Dr. Koneswaran now presenting to establish follow-up with me.  I reviewed extensive records and updated the chart.  He was most recently seen by Mr. Quinn NP in October and has been concurrently managed in the atrial fibrillation clinic.  He is here today with his wife.  We discussed his symptoms.  Within the last 3 months he has had some episodes of orthopnea, vague sense of palpitations, but in general his heart rate has been well controlled on medical therapy including Toprol-XL along with Coumadin for stroke prophylaxis.  He does not describe any obvious angina, no leg swelling.  He has been on Lasix for about the last month.  His weight has been stable.  He states that he "cannot sit still" and walks for exercise, states that he walked 5 miles yesterday.  He describes NYHA class II dyspnea.  No palpitations or syncope.  He underwent a recent follow-up echocardiogram that demonstrated further reduction in LVEF, now to the range of 20 to 25%, mildly decreased RV contraction with RVSP 47 mmHg, severe left atrial enlargement, mild to moderate mitral regurgitation, moderate tricuspid regurgitation, and mild to moderate calcific aortic stenosis.  Previous LVEF had been 35 to 40% as of February 2020.  He tells me that he recalls being placed on Entresto last year and did not tolerate even low-dose due to significant weakness and likely in the setting of low blood pressure at that point.  He is on Coumadin with follow-up in the anticoagulation clinic.  CHA2DS2-VASc score is 4.  He was started on amiodarone recently by Mr. Quinn NP in anticipation of a potential  cardioversion attempt through the atrial fibrillation clinic.  He called the office stating that he was weak and fatigued, was hypotensive yesterday, blood pressure looks better today.  I reviewed his present medications which are outlined below.  Past Medical History:  Diagnosis Date  . Atrial fibrillation (HCC)   . CAD (coronary artery disease)    BMS x 2 distal RCA 1998; DES x 2 mid to distal RCA 2005, had occluded distal circumflex with left to left collaterals as well  . Cataract   . Depression   . Essential hypertension   . Hyperlipidemia   . Insomnia   . Ischemic cardiomyopathy   . Sinus congestion     Past Surgical History:  Procedure Laterality Date  . APPENDECTOMY    . CATARACT EXTRACTION W/PHACO Right 07/08/2013   Procedure: CATARACT EXTRACTION PHACO AND INTRAOCULAR LENS PLACEMENT (IOC);  Surgeon: Kerry Hunt, MD;  Location: [facility];  Service: Ophthalmology;  Laterality: Right;  CDE 26.34  . CORONARY ANGIOPLASTY     stents x4  . ROTATOR CUFF REPAIR Right   . Undescended testicle surgery      Current Outpatient Medications  Medication Sig Dispense Refill  . aspirin EC 81 MG tablet Take 81 mg by mouth daily.    . atorvastatin (LIPITOR) 80 MG tablet Take 1 tablet (80 mg total) by mouth daily. 90 tablet 3  . furosemide (LASIX) 20 MG tablet Take 1 tablet (20 mg total) by mouth daily. 90 tablet 3  . isosorbide mononitrate (  IMDUR) 30 MG 24 hr tablet Take 30 mg by mouth daily.    Marland Kitchen loratadine (CLARITIN) 10 MG tablet Take 10 mg by mouth daily as needed for allergies.    . metoprolol succinate (TOPROL-XL) 50 MG 24 hr tablet Take 50 mg by mouth daily with supper.    . warfarin (COUMADIN) 5 MG tablet Take 1 tablet daily except 1/2 tablet on Tuesdays, Thursdays and Saturdays or as directed (Patient taking differently: Take 1 tablet daily except 1/2 tablet on Tuesdays, Thursdays and Saturdays, and Sundays or as directed. Taken with evening meal) 90 tablet 3  . digoxin (LANOXIN) 0.125  MG tablet Take 1 tablet (0.125 mg total) by mouth daily. 90 tablet 1   No current facility-administered medications for this visit.   Allergies:  Patient has no known allergies.   Social History: The patient  reports that he quit smoking about 29 years ago. His smoking use included cigarettes. He has a 7.50 pack-year smoking history. He has never used smokeless tobacco. He reports current alcohol use of about 2.0 standard drinks of alcohol per week. He reports that he does not use drugs.   Family History: The patient's family history includes Diabetes in his father; Stroke in his father.   ROS: No syncope.  Physical Exam: VS:  BP 104/68   Pulse 61   Ht 5' 6.5" (1.689 m)   Wt 144 lb (65.3 kg)   SpO2 93%   BMI 22.89 kg/m , BMI Body mass index is 22.89 kg/m.  Wt Readings from Last 3 Encounters:  06/20/20 144 lb (65.3 kg)  06/02/20 146 lb (66.2 kg)  05/31/20 145 lb (65.8 kg)    General: Elderly male, appears comfortable at rest. HEENT: Conjunctiva and lids normal, wearing a mask. Neck: Supple, no elevated JVP or carotid bruits, no thyromegaly. Lungs: Clear to auscultation, nonlabored breathing at rest. Cardiac: Irregularly irregular, no S3, 2/6 apical systolic murmur, no pericardial rub. Abdomen: Soft, nontender, bowel sounds present. Extremities: No pitting edema, distal pulses 2+. Skin: Warm and dry. Musculoskeletal: No kyphosis. Neuropsychiatric: Alert and oriented x3, affect grossly appropriate.  ECG:  An ECG dated 06/08/2020 was personally reviewed today and demonstrated:  Course atrial fibrillation/atypical atrial flutter with variable block, increased voltage, diffuse repolarization abnormalities and PVC.  Recent Labwork: 05/17/2020: ALT 25; AST 35; BUN 16; Creatinine, Ser 1.04; Hemoglobin 14.0; Platelets 200; Potassium 4.2; Sodium 134     Component Value Date/Time   CHOL 127 07/13/2019 1013   TRIG 68.0 07/13/2019 1013   HDL 38.30 (L) 07/13/2019 1013   CHOLHDL 3  07/13/2019 1013   VLDL 13.6 07/13/2019 1013   LDLCALC 75 07/13/2019 1013   LDLDIRECT 60.0 07/10/2017 0905    Other Studies Reviewed Today:  Cardiac catheterization 05/16/2004:  1.  Left main is normal.  2.  Left anterior descending artery has a 30% stenosis in the proximal      vessel and diffuse 30% stenosis in the midvessel.  The LAD gives rise to      a large first diagonal branch which also has a 30% stenosis proximally.  3.  Left circumflex has a 20% stenosis in the midvessel.  The circumflex      gives rise to a normal size first obtuse marginal, small second obtuse      marginal, and large third obtuse marginal.  Distal to the third obtuse      marginal, the circumflex is very small and it appears to be chronically  occluded with left-to-left collaterals filling a smaller fourth obtuse      marginal branch.  4.  Right coronary artery has a 40% stenosis in the proximal vessel.  In the      distal vessel just beyond the acute margin, there are overlapping      stents.  The vessel is 100% occluded within the proximal to midportion      of the stents.  The stents do extend into the AV groove where the right      coronary artery crossed the origin of the posterior descending artery      and posterolateral branch.  Within this area the stent is still open,      however, there is a long 70% stenosis within the distal portion of the      stented segment of the right coronary artery.  The distal right coronary      artery which includes a normal size posterior descending artery, two      small posterolateral branches followed by a normal size third      posterolateral branch filled via grade III right-to-right collaterals      arising from an acute marginal branch.  In the ostium of the posterior      descending artery, there appears to be a 50% stenosis.  Echocardiogram 06/13/2020: 1. Left ventricular ejection fraction, by estimation, is 20 to 25%. The  left ventricle has  severely decreased function. The left ventricle  demonstrates regional wall motion abnormalities (see scoring  diagram/findings for description). There is mild  asymmetric left ventricular hypertrophy of the septal segment. Left  ventricular diastolic parameters are indeterminate.  2. Right ventricular systolic function is mildly reduced. The right  ventricular size is normal. There is moderately elevated pulmonary artery  systolic pressure. The estimated right ventricular systolic pressure is  47.4 mmHg.  3. Left atrial size was severely dilated.  4. Right atrial size was moderately dilated.  5. The mitral valve is grossly normal. Mild to moderate mitral valve  regurgitation.  6. Tricuspid valve regurgitation is moderate.  7. The aortic valve is tricuspid. There is moderate calcification of the  aortic valve. Aortic valve regurgitation is mild. Mild to moderate aortic  valve stenosis. Aortic valve mean gradient measures 11.5 mmHg. Aortic  valve Vmax measures 2.16 m/s.  Dimentionless index 0.43.  8. The inferior vena cava is normal in size with greater than 50%  respiratory variability, suggesting right atrial pressure of 3 mmHg.   Comparison(s): Echocardiogram done 10/01/18 showed an EF of 35-40%.   Assessment and Plan:  1.  Progressive cardiomyopathy with underlying ischemic heart disease, possibly overall mixed picture however.  LVEF most recently 20 to 25% with mild RV dysfunction and moderate pulmonary hypertension.  Functional status sounds fairly reasonable based on discussion today, although he has had some intermittent orthopnea, seems to be a little better since being on Lasix.  It does not appear that he has uncontrolled atrial fibrillation as a cause of his cardiomyopathy based on recorded heart rates.  Beta-blocker has been down titrated (given recent attempt at amiodarone load), plan to continue Toprol-XL at 50 mg daily for now.  Start Lanoxin 0.125 mg daily, continue  Imdur and Lasix for now.  He reports prior intolerance of even low-dose Entresto which was attempted last year when his LVEF was 35 to 40%.  He has not been on Aldactone as yet.  Recent systolics 90-100 range.  After discussion of risks and benefits, plan is  to get him scheduled for a right and left heart catheterization with Dr. Gala Romney to better understand coronary anatomy and hemodynamics.  Medications can be adjusted further from there.  2.  Persistent/permanent atrial fibrillation with CHA2DS2-VASc score of 4.  He has a severely dilated left atrium by his most recent echocardiogram and I doubt that he will maintain sinus rhythm long-term.  For now I would hold off on scheduling an attempt at cardioversion, I have asked him to stop amiodarone.  Focus will be on heart rate control and anticoagulation.  3.  Mitral regurgitation, mild to moderate.  4.  CAD status post previous stent interventions to the RCA, most recently in 2005 as discussed above.  He had an occluded distal circumflex at that time with left to left collaterals.  No definite angina on Imdur.  He has remained on low-dose aspirin as well as statin therapy.  Medication Adjustments/Labs and Tests Ordered: Current medicines are reviewed at length with the patient today.  Concerns regarding medicines are outlined above.   Tests Ordered: Orders Placed This Encounter  Procedures  . Basic metabolic panel  . CBC    Medication Changes: Meds ordered this encounter  Medications  . digoxin (LANOXIN) 0.125 MG tablet    Sig: Take 1 tablet (0.125 mg total) by mouth daily.    Dispense:  90 tablet    Refill:  1    06/20/2020 NEW    Disposition:  Follow up 4 to 6 weeks.  Signed, Jonelle Sidle, MD, [facility], [place] 06/20/2020 9:22 AM    [facility] at [facility] [address] [place], [place], [place] [address] Phone: [phone]; Fax: [phone]

## 2020-06-19 NOTE — Telephone Encounter (Signed)
Pt took amiodarone 200 mg (morning dose) and Imdur 60 mg this morning - BP was 91/58 HR 78 - pt c/o feeling weak and tired and wanted to know should he take the evening dose of amiodarone and the morning dose tomorrow - notes from 10/29 says to take amiodarone 200 mg bid x 1 week - pt has appt tomorrow with Dr Diona Browner

## 2020-06-20 ENCOUNTER — Ambulatory Visit: Payer: Medicare Other | Admitting: Cardiology

## 2020-06-20 ENCOUNTER — Telehealth: Payer: Self-pay | Admitting: Cardiology

## 2020-06-20 ENCOUNTER — Other Ambulatory Visit: Payer: Self-pay | Admitting: Cardiology

## 2020-06-20 ENCOUNTER — Ambulatory Visit: Payer: Medicare Other | Admitting: Family Medicine

## 2020-06-20 ENCOUNTER — Encounter: Payer: Self-pay | Admitting: Cardiology

## 2020-06-20 VITALS — BP 104/68 | HR 61 | Ht 66.5 in | Wt 144.0 lb

## 2020-06-20 DIAGNOSIS — Z01810 Encounter for preprocedural cardiovascular examination: Secondary | ICD-10-CM | POA: Diagnosis not present

## 2020-06-20 DIAGNOSIS — I25119 Atherosclerotic heart disease of native coronary artery with unspecified angina pectoris: Secondary | ICD-10-CM | POA: Diagnosis not present

## 2020-06-20 DIAGNOSIS — I255 Ischemic cardiomyopathy: Secondary | ICD-10-CM

## 2020-06-20 DIAGNOSIS — Z5181 Encounter for therapeutic drug level monitoring: Secondary | ICD-10-CM

## 2020-06-20 DIAGNOSIS — I4819 Other persistent atrial fibrillation: Secondary | ICD-10-CM

## 2020-06-20 MED ORDER — DIGOXIN 125 MCG PO TABS: 0.1250 mg | ORAL_TABLET | Freq: Every day | ORAL | 1 refills | Status: DC

## 2020-06-20 MED ORDER — SODIUM CHLORIDE 0.9% FLUSH: 3.0000 mL | Freq: Two times a day (BID) | INTRAVENOUS | Status: DC

## 2020-06-20 NOTE — Patient Instructions (Addendum)
Medication Instructions:   Your physician has recommended you make the following change in your medication:   Start digoxin 0.125 mg by mouth daily  Continue other medications the same  Labwork:  Your physician recommends that you return for lab work in: BMET & CBC& PT/INR & covid test-date will be given once heart cath scheduled  Testing/Procedures: Your physician has requested that you have a left and right cardiac catheterization. Cardiac catheterization is used to diagnose and/or treat various heart conditions. Doctors may recommend this procedure for a number of different reasons. The most common reason is to evaluate chest pain. Chest pain can be a symptom of coronary artery disease (CAD), and cardiac catheterization can show whether plaque is narrowing or blocking your heart's arteries. This procedure is also used to evaluate the valves, as well as measure the blood flow and oxygen levels in different parts of your heart. For further information please visit [email or web address] Please follow instruction sheet, as given.  Follow-Up:  Your physician recommends that you schedule a follow-up appointment in: 4-6 weeks with Dr. Diona Browner  Any Other Special Instructions Will Be Listed Below (If Applicable).  If you need a refill on your cardiac medications before your next appointment, please call your pharmacy.     [facility] CARDIOVASCULAR DIVISION [facility] [place] [address] [place] [address] Dept: [phone] Loc: [phone]  Martin James  06/20/2020  You are scheduled for a Cardiac Catheterization on Tuesday, July 04 2020 with Dr. Jones Broom.   1. Please arrive at the [facility] (Main Entrance A) at [facility]: [address] [place], [place] [address] @7 :00 am (This time is two hours before your procedure to ensure your preparation). Free valet parking service is available.   Special note:  Every effort is made to have your procedure done on time. Please understand that emergencies sometimes delay scheduled procedures.  2. Diet: Do not eat solid foods after midnight.  The patient may have clear liquids until 5am upon the day of the procedure.  3. Labs: You will need to have blood drawn 07/03/2020 at [facility] . You do not need to be fasting. Covid test is scheduled at 8:30 am at [facility]. Please quarantine after covid test until your heart cath is complete.  4. Medication instructions in preparation for your procedure:   Contrast Allergy: No  Hold your warfarin 3 days before your heart cath starting 07/01/2020  Hold your furosemide on the morning of your cath  On the morning of your procedure, take your Aspirin 81 mg and any morning medicines NOT listed above.  You may use sips of water.  5. Plan for one night stay--bring personal belongings. 6. Bring a current list of your medications and current insurance cards. 7. You MUST have a responsible person to drive you home. 8. Someone MUST be with you the first 24 hours after you arrive home or your discharge will be delayed. 9. Please wear clothes that are easy to get on and off and wear slip-on shoes.  Thank you for allowing 07/03/2020 to care for you!   -- [facility]

## 2020-06-20 NOTE — Telephone Encounter (Signed)
Pre-cert Verification for the following procedure     Left and Right heart cath dx: cardiomyopathy with Dr. Jones Broom 07/04/2020 9:00 amN:

## 2020-06-27 ENCOUNTER — Other Ambulatory Visit: Payer: Self-pay

## 2020-06-27 ENCOUNTER — Ambulatory Visit (INDEPENDENT_AMBULATORY_CARE_PROVIDER_SITE_OTHER): Payer: Medicare Other | Admitting: *Deleted

## 2020-06-27 DIAGNOSIS — Z5181 Encounter for therapeutic drug level monitoring: Secondary | ICD-10-CM | POA: Diagnosis not present

## 2020-06-27 DIAGNOSIS — I480 Paroxysmal atrial fibrillation: Secondary | ICD-10-CM

## 2020-06-27 LAB — POCT INR: INR: 2.3 (ref 2.0–3.0)

## 2020-06-27 MED ORDER — WARFARIN SODIUM 5 MG PO TABS
ORAL_TABLET | ORAL | 3 refills | Status: DC
Start: 2020-06-27 — End: 2021-01-04

## 2020-06-27 NOTE — Patient Instructions (Signed)
Pending cardiac cath 11/30 Amiodarone 200mg  twice daily was discontinued Increase warfarin to 1/2 tablet daily except 1 tablet on Sundays and Wednesdays.  Will hold warfarin 3 days before cath.  Take last dose on 06/30/20.  Resume after procedure per MD order. Continue greens/salads Recheck INR in 1 week after cath.

## 2020-06-27 NOTE — Addendum Note (Signed)
Addended by: Louanna Raw on: 06/27/2020 08:01 AM   Modules accepted: Orders

## 2020-07-03 ENCOUNTER — Other Ambulatory Visit (HOSPITAL_COMMUNITY)
Admission: RE | Admit: 2020-07-03 | Discharge: 2020-07-03 | Disposition: A | Discharge status: Home / Self Care | Payer: Medicare Other | Source: Ambulatory Visit | Attending: Cardiology | Admitting: Cardiology

## 2020-07-03 ENCOUNTER — Telehealth: Payer: Self-pay | Admitting: *Deleted

## 2020-07-03 ENCOUNTER — Other Ambulatory Visit: Payer: Self-pay

## 2020-07-03 DIAGNOSIS — Z01818 Encounter for other preprocedural examination: Secondary | ICD-10-CM | POA: Diagnosis not present

## 2020-07-03 DIAGNOSIS — I255 Ischemic cardiomyopathy: Secondary | ICD-10-CM | POA: Insufficient documentation

## 2020-07-03 DIAGNOSIS — Z5181 Encounter for therapeutic drug level monitoring: Secondary | ICD-10-CM | POA: Insufficient documentation

## 2020-07-03 DIAGNOSIS — Z20822 Contact with and (suspected) exposure to covid-19: Secondary | ICD-10-CM | POA: Diagnosis not present

## 2020-07-03 DIAGNOSIS — I25119 Atherosclerotic heart disease of native coronary artery with unspecified angina pectoris: Secondary | ICD-10-CM | POA: Insufficient documentation

## 2020-07-03 DIAGNOSIS — Z01810 Encounter for preprocedural cardiovascular examination: Secondary | ICD-10-CM | POA: Insufficient documentation

## 2020-07-03 LAB — CBC
HCT: 46 % (ref 39.0–52.0)
Hemoglobin: 15.1 g/dL (ref 13.0–17.0)
MCH: 32.6 pg (ref 26.0–34.0)
MCHC: 32.8 g/dL (ref 30.0–36.0)
MCV: 99.4 fL (ref 80.0–100.0)
Platelets: 193 10*3/uL (ref 150–400)
RBC: 4.63 MIL/uL (ref 4.22–5.81)
RDW: 13.6 % (ref 11.5–15.5)
WBC: 5.6 10*3/uL (ref 4.0–10.5)
nRBC: 0 % (ref 0.0–0.2)

## 2020-07-03 LAB — APTT: aPTT: 37 seconds — ABNORMAL HIGH (ref 24–36)

## 2020-07-03 LAB — BASIC METABOLIC PANEL
Anion gap: 7 (ref 5–15)
BUN: 21 mg/dL (ref 8–23)
CO2: 29 mmol/L (ref 22–32)
Calcium: 9.2 mg/dL (ref 8.9–10.3)
Chloride: 99 mmol/L (ref 98–111)
Creatinine, Ser: 1.09 mg/dL (ref 0.61–1.24)
GFR, Estimated: >60 mL/min (ref >60)
Glucose, Bld: 107 mg/dL — ABNORMAL HIGH (ref 70–99)
Potassium: 4.4 mmol/L (ref 3.5–5.1)
Sodium: 135 mmol/L (ref 135–145)

## 2020-07-03 LAB — PROTIME-INR
INR: 1.5 — ABNORMAL HIGH (ref 0.8–1.2)
Prothrombin Time: 17.9 seconds — ABNORMAL HIGH (ref 11.4–15.2)

## 2020-07-03 LAB — SARS CORONAVIRUS 2 (TAT 6-24 HRS): SARS Coronavirus 2: NEGATIVE

## 2020-07-03 NOTE — Telephone Encounter (Signed)
Pt contacted pre-catheterization scheduled at [facility] for:  Tuesday July 04, 2020 9 AM Verified arrival time and place: [facility] Main Entr[facility]) at: 7 AM   No solid food after midnight prior to cath, clear liquids until 5 AM day of procedure.  Hold: Coumadin-none 07/01/20 until post procedure  Lasix-AM of procedure   Except hold medications AM meds can be  taken pre-cath with sips of water including: ASA 81 mg   Confirmed patient has responsible adult to drive home post procedure and be with patient first 24 hours after arriving home:  You are allowed ONE visitor in the waiting room during the time you are at the hospital for your procedure. Both you and your visitor must wear a mask once you enter the hospital.       COVID-19 Pre-Screening Questions:   In the past 14 days have you had any symptoms concerning for COVID-19 infection (fever, chills, cough, or new shortness of breath)? no  In the past 14 days have you been around anyone with known Covid 19? no   Reviewed procedure/mask/visitor instructions, COVID-19 questions with patient.

## 2020-07-04 ENCOUNTER — Encounter (HOSPITAL_COMMUNITY): Payer: Self-pay | Admitting: Internal Medicine

## 2020-07-04 ENCOUNTER — Ambulatory Visit (HOSPITAL_COMMUNITY)
Admission: RE | Admit: 2020-07-04 | Discharge: 2020-07-04 | Disposition: A | Discharge status: Home / Self Care | Payer: Medicare Other | Attending: Internal Medicine | Admitting: Internal Medicine

## 2020-07-04 ENCOUNTER — Encounter (HOSPITAL_COMMUNITY)
Admission: RE | Disposition: A | Discharge status: Home / Self Care | Payer: Self-pay | Source: Home / Self Care | Attending: Internal Medicine

## 2020-07-04 ENCOUNTER — Other Ambulatory Visit: Payer: Self-pay

## 2020-07-04 DIAGNOSIS — I272 Pulmonary hypertension, unspecified: Secondary | ICD-10-CM | POA: Diagnosis not present

## 2020-07-04 DIAGNOSIS — I2582 Chronic total occlusion of coronary artery: Secondary | ICD-10-CM | POA: Insufficient documentation

## 2020-07-04 DIAGNOSIS — Z87891 Personal history of nicotine dependence: Secondary | ICD-10-CM | POA: Diagnosis not present

## 2020-07-04 DIAGNOSIS — Z7982 Long term (current) use of aspirin: Secondary | ICD-10-CM | POA: Insufficient documentation

## 2020-07-04 DIAGNOSIS — Z79899 Other long term (current) drug therapy: Secondary | ICD-10-CM | POA: Diagnosis not present

## 2020-07-04 DIAGNOSIS — I255 Ischemic cardiomyopathy: Secondary | ICD-10-CM

## 2020-07-04 DIAGNOSIS — I340 Nonrheumatic mitral (valve) insufficiency: Secondary | ICD-10-CM | POA: Diagnosis not present

## 2020-07-04 DIAGNOSIS — I110 Hypertensive heart disease with heart failure: Secondary | ICD-10-CM | POA: Insufficient documentation

## 2020-07-04 DIAGNOSIS — I5022 Chronic systolic (congestive) heart failure: Secondary | ICD-10-CM | POA: Insufficient documentation

## 2020-07-04 DIAGNOSIS — I4821 Permanent atrial fibrillation: Secondary | ICD-10-CM | POA: Insufficient documentation

## 2020-07-04 DIAGNOSIS — Z955 Presence of coronary angioplasty implant and graft: Secondary | ICD-10-CM | POA: Insufficient documentation

## 2020-07-04 DIAGNOSIS — I2510 Atherosclerotic heart disease of native coronary artery without angina pectoris: Secondary | ICD-10-CM | POA: Insufficient documentation

## 2020-07-04 HISTORY — PX: RIGHT/LEFT HEART CATH AND CORONARY ANGIOGRAPHY: CATH118266

## 2020-07-04 LAB — POCT I-STAT 7, (LYTES, BLD GAS, ICA,H+H)
Acid-base deficit: 1 mmol/L (ref 0.0–2.0)
Bicarbonate: 24.9 mmol/L (ref 20.0–28.0)
Calcium, Ion: 0.9 mmol/L — ABNORMAL LOW (ref 1.15–1.40)
HCT: 34 % — ABNORMAL LOW (ref 39.0–52.0)
Hemoglobin: 11.6 g/dL — ABNORMAL LOW (ref 13.0–17.0)
O2 Saturation: 99 %
Potassium: 3.3 mmol/L — ABNORMAL LOW (ref 3.5–5.1)
Sodium: 147 mmol/L — ABNORMAL HIGH (ref 135–145)
TCO2: 26 mmol/L (ref 22–32)
pCO2 arterial: 44.9 mmHg (ref 32.0–48.0)
pH, Arterial: 7.353 (ref 7.350–7.450)
pO2, Arterial: 155 mmHg — ABNORMAL HIGH (ref 83.0–108.0)

## 2020-07-04 LAB — POCT I-STAT EG7
Acid-Base Excess: 2 mmol/L (ref 0.0–2.0)
Acid-Base Excess: 3 mmol/L — ABNORMAL HIGH (ref 0.0–2.0)
Bicarbonate: 28.9 mmol/L — ABNORMAL HIGH (ref 20.0–28.0)
Bicarbonate: 29.3 mmol/L — ABNORMAL HIGH (ref 20.0–28.0)
Calcium, Ion: 1.21 mmol/L (ref 1.15–1.40)
Calcium, Ion: 1.23 mmol/L (ref 1.15–1.40)
HCT: 40 % (ref 39.0–52.0)
HCT: 40 % (ref 39.0–52.0)
Hemoglobin: 13.6 g/dL (ref 13.0–17.0)
Hemoglobin: 13.6 g/dL (ref 13.0–17.0)
O2 Saturation: 71 %
O2 Saturation: 76 %
Potassium: 4 mmol/L (ref 3.5–5.1)
Potassium: 4.1 mmol/L (ref 3.5–5.1)
Sodium: 140 mmol/L (ref 135–145)
Sodium: 142 mmol/L (ref 135–145)
TCO2: 30 mmol/L (ref 22–32)
TCO2: 31 mmol/L (ref 22–32)
pCO2, Ven: 52.9 mmHg (ref 44.0–60.0)
pCO2, Ven: 53 mmHg (ref 44.0–60.0)
pH, Ven: 7.345 (ref 7.250–7.430)
pH, Ven: 7.351 (ref 7.250–7.430)
pO2, Ven: 40 mmHg (ref 32.0–45.0)
pO2, Ven: 43 mmHg (ref 32.0–45.0)

## 2020-07-04 LAB — PROTIME-INR
INR: 1.3 — ABNORMAL HIGH (ref 0.8–1.2)
Prothrombin Time: 15.9 seconds — ABNORMAL HIGH (ref 11.4–15.2)

## 2020-07-04 SURGERY — RIGHT/LEFT HEART CATH AND CORONARY ANGIOGRAPHY
Anesthesia: LOCAL

## 2020-07-04 MED ORDER — FENTANYL CITRATE (PF) 100 MCG/2ML IJ SOLN
INTRAMUSCULAR | Status: DC | PRN
  Administered 2020-07-04: 25 ug via INTRAVENOUS

## 2020-07-04 MED ORDER — HEPARIN (PORCINE) IN NACL 1000-0.9 UT/500ML-% IV SOLN
INTRAVENOUS | Status: DC | PRN
  Administered 2020-07-04: 500 mL

## 2020-07-04 MED ORDER — SODIUM CHLORIDE 0.9% FLUSH: 3.0000 mL | INTRAVENOUS | Status: DC | PRN

## 2020-07-04 MED ORDER — ONDANSETRON HCL 4 MG/2ML IJ SOLN: 4.0000 mg | Freq: Four times a day (QID) | INTRAMUSCULAR | Status: DC | PRN

## 2020-07-04 MED ORDER — ACETAMINOPHEN 325 MG PO TABS: 650.0000 mg | ORAL_TABLET | ORAL | Status: DC | PRN

## 2020-07-04 MED ORDER — ASPIRIN 81 MG PO CHEW: 81.0000 mg | CHEWABLE_TABLET | ORAL | Status: DC

## 2020-07-04 MED ORDER — SODIUM CHLORIDE 0.9 % IV SOLN: 250.0000 mL | INTRAVENOUS | Status: DC | PRN

## 2020-07-04 MED ORDER — LIDOCAINE HCL (PF) 1 % IJ SOLN
INTRAMUSCULAR | Status: AC
  Filled 2020-07-04: qty 30

## 2020-07-04 MED ORDER — VERAPAMIL HCL 2.5 MG/ML IV SOLN
INTRAVENOUS | Status: DC | PRN
  Administered 2020-07-04: 10 mL via INTRA_ARTERIAL

## 2020-07-04 MED ORDER — FENTANYL CITRATE (PF) 100 MCG/2ML IJ SOLN
INTRAMUSCULAR | Status: AC
  Filled 2020-07-04: qty 2

## 2020-07-04 MED ORDER — HYDRALAZINE HCL 20 MG/ML IJ SOLN: 10.0000 mg | INTRAMUSCULAR | Status: DC | PRN

## 2020-07-04 MED ORDER — MIDAZOLAM HCL 2 MG/2ML IJ SOLN
INTRAMUSCULAR | Status: DC | PRN
  Administered 2020-07-04: 1 mg via INTRAVENOUS

## 2020-07-04 MED ORDER — SODIUM CHLORIDE 0.9 % IV SOLN: INTRAVENOUS | Status: DC

## 2020-07-04 MED ORDER — VERAPAMIL HCL 2.5 MG/ML IV SOLN
INTRAVENOUS | Status: AC
  Filled 2020-07-04: qty 2

## 2020-07-04 MED ORDER — HEPARIN SODIUM (PORCINE) 1000 UNIT/ML IJ SOLN
INTRAMUSCULAR | Status: AC
  Filled 2020-07-04: qty 1

## 2020-07-04 MED ORDER — SODIUM CHLORIDE 0.9% FLUSH: 3.0000 mL | Freq: Two times a day (BID) | INTRAVENOUS | Status: DC

## 2020-07-04 MED ORDER — MIDAZOLAM HCL 2 MG/2ML IJ SOLN
INTRAMUSCULAR | Status: AC
  Filled 2020-07-04: qty 2

## 2020-07-04 MED ORDER — LIDOCAINE HCL (PF) 1 % IJ SOLN
INTRAMUSCULAR | Status: DC | PRN
  Administered 2020-07-04: 1 mL
  Administered 2020-07-04: 2 mL

## 2020-07-04 MED ORDER — HEPARIN SODIUM (PORCINE) 1000 UNIT/ML IJ SOLN
INTRAMUSCULAR | Status: DC | PRN
  Administered 2020-07-04: 3000 [IU] via INTRAVENOUS

## 2020-07-04 MED ORDER — HEPARIN (PORCINE) IN NACL 1000-0.9 UT/500ML-% IV SOLN
INTRAVENOUS | Status: AC
  Filled 2020-07-04: qty 1000

## 2020-07-04 MED ORDER — LABETALOL HCL 5 MG/ML IV SOLN: 10.0000 mg | INTRAVENOUS | Status: DC | PRN

## 2020-07-04 MED ORDER — IOHEXOL 350 MG/ML SOLN
INTRAVENOUS | Status: DC | PRN
  Administered 2020-07-04: 35 mL

## 2020-07-04 SURGICAL SUPPLY — 10 items
CATH 5FR JL3.5 JR4 ANG PIG MP (CATHETERS) ×1 IMPLANT
CATH SWAN GANZ 7F STRAIGHT (CATHETERS) ×1 IMPLANT
DEVICE RAD COMP TR BAND LRG (VASCULAR PRODUCTS) ×2 IMPLANT
GLIDESHEATH SLEND SS 6F .021 (SHEATH) ×1 IMPLANT
GLIDESHEATH SLENDER 7FR .021G (SHEATH) ×2 IMPLANT
GUIDEWIRE .025 260CM (WIRE) ×2 IMPLANT
GUIDEWIRE INQWIRE 1.5J.035X260 (WIRE) ×1 IMPLANT
INQWIRE 1.5J .035X260CM (WIRE) ×2
PACK CARDIAC CATHETERIZATION (CUSTOM PROCEDURE TRAY) ×2 IMPLANT
TRANSDUCER W/STOPCOCK (MISCELLANEOUS) ×2 IMPLANT

## 2020-07-04 NOTE — Interval H&P Note (Signed)
History and Physical Interval Note:  07/04/2020 9:14 AM  Martin James  has presented today for surgery, with the diagnosis of heart failure.  The various methods of treatment have been discussed with the patient and family. After consideration of risks, benefits and other options for treatment, the patient has consented to  Procedure(s): RIGHT/LEFT HEART CATH AND CORONARY ANGIOGRAPHY (N/A) and possible coronary angioplasty as a surgical intervention.  The patient's history has been reviewed, patient examined, no change in status, stable for surgery.  I have reviewed the patient's chart and labs.  Questions were answered to the patient's satisfaction.     [name]

## 2020-07-04 NOTE — Discharge Instructions (Signed)
Drink plenty of fluid for 48 hours and keep wrist elevated at heart level for 24 hours  Radial Site Care   This sheet gives you information about how to care for yourself after your procedure. Your health care provider may also give you more specific instructions. If you have problems or questions, contact your health care provider. What can I expect after the procedure? After the procedure, it is common to have:  Bruising and tenderness at the catheter insertion area. Follow these instructions at home: Medicines  Take over-the-counter and prescription medicines only as told by your health care provider. Insertion site care 1. Follow instructions from your health care provider about how to take care of your insertion site. Make sure you: ? Wash your hands with soap and water before you change your bandage (dressing). If soap and water are not available, use hand sanitizer. ? remove your dressing as told by your health care provider. In 24 hours 2. Check your insertion site every day for signs of infection. Check for: ? Redness, swelling, or pain. ? Fluid or blood. ? Pus or a bad smell. ? Warmth. 3. Do not take baths, swim, or use a hot tub until your health care provider approves. 4. You may shower 24-48 hours after the procedure, or as directed by your health care provider. ? Remove the dressing and gently wash the site with plain soap and water. ? Pat the area dry with a clean towel. ? Do not rub the site. That could cause bleeding. 5. Do not apply powder or lotion to the site. Activity   1. For 24 hours after the procedure, or as directed by your health care provider: ? Do not flex or bend the affected arm. ? Do not push or pull heavy objects with the affected arm. ? Do not drive yourself home from the hospital or clinic. You may drive 24 hours after the procedure unless your health care provider tells you not to. ? Do not operate machinery or power tools. 2. Do not lift  anything that is heavier than 10 lb (4.5 kg), or the limit that you are told, until your health care provider says that it is safe. For 4 days 3. Ask your health care provider when it is okay to: ? Return to work or school. ? Resume usual physical activities or sports. ? Resume sexual activity. General instructions  If the catheter site starts to bleed, raise your arm and put firm pressure on the site. If the bleeding does not stop, get help right away. This is a medical emergency.  If you went home on the same day as your procedure, a responsible adult should be with you for the first 24 hours after you arrive home.  Keep all follow-up visits as told by your health care provider. This is important. Contact a health care provider if:  You have a fever.  You have redness, swelling, or yellow drainage around your insertion site. Get help right away if:  You have unusual pain at the radial site.  The catheter insertion area swells very fast.  The insertion area is bleeding, and the bleeding does not stop when you hold steady pressure on the area.  Your arm or hand becomes pale, cool, tingly, or numb. These symptoms may represent a serious problem that is an emergency. Do not wait to see if the symptoms will go away. Get medical help right away. Call your local emergency services (911 in the [place] Do not   drive yourself to the hospital. Summary  After the procedure, it is common to have bruising and tenderness at the site.  Follow instructions from your health care provider about how to take care of your radial site wound. Check the wound every day for signs of infection.  Do not lift anything that is heavier than 10 lb (4.5 kg), or the limit that you are told, until your health care provider says that it is safe. This information is not intended to replace advice given to you by your health care provider. Make sure you discuss any questions you have with your health care  provider. Document Revised: 08/27/2017 Document Reviewed: 08/27/2017 Elsevier Patient Education  2020 [employer]  

## 2020-07-04 NOTE — Progress Notes (Signed)
Patient and wife was given discharge instructions. Both verbalized understanding. 

## 2020-07-10 ENCOUNTER — Ambulatory Visit (INDEPENDENT_AMBULATORY_CARE_PROVIDER_SITE_OTHER): Payer: Medicare Other | Admitting: *Deleted

## 2020-07-10 DIAGNOSIS — I480 Paroxysmal atrial fibrillation: Secondary | ICD-10-CM | POA: Diagnosis not present

## 2020-07-10 DIAGNOSIS — Z5181 Encounter for therapeutic drug level monitoring: Secondary | ICD-10-CM | POA: Diagnosis not present

## 2020-07-10 LAB — POCT INR: INR: 1.6 — AB (ref 2.0–3.0)

## 2020-07-10 NOTE — Patient Instructions (Signed)
Increase warfarin to 1/2 tablet daily except 1 tablet on Mondays, Wednesdays and Fridays Continue greens/salads Recheck INR in 1 week

## 2020-07-18 NOTE — Progress Notes (Signed)
Phone: [phone]   Subjective:  Patient presents today for their annual physical. Chief complaint-noted.   See problem oriented charting- ROS- full  review of systems was completed and negative  except for: post nasal drip, runny nose, sinus pressure- yearly issues, palpitations, increased urination on lasix  The following were reviewed and entered/updated in epic: Past Medical History:  Diagnosis Date  . Atrial fibrillation (HCC)   . CAD (coronary artery disease)    BMS x 2 distal RCA 1998; DES x 2 mid to distal RCA 2005, had occluded distal circumflex with left to left collaterals as well  . Cataract   . Depression   . Essential hypertension   . Hyperlipidemia   . Insomnia   . Ischemic cardiomyopathy   . Sinus congestion    Patient Active Problem List   Diagnosis Date Noted  . Pulmonary nodule 11/12/2019    Priority: High  . Atrial fibrillation (HCC) 09/29/2018    Priority: High  . CAD (coronary artery disease) 02/17/2007    Priority: High  . Binocular diplopia 12/31/2016    Priority: Medium  . Hyperlipidemia 02/17/2007    Priority: Medium  . Essential hypertension 02/17/2007    Priority: Medium  . Headache 05/24/2014    Priority: Low  . INSOMNIA, PERSISTENT 03/03/2007    Priority: Low  . Depression 03/03/2007    Priority: Low  . SYNDROME, CARPAL TUNNEL 02/17/2007    Priority: Low  . Fatty liver 02/17/2007    Priority: Low  . Encounter for therapeutic drug monitoring 09/29/2018  . Chronic sinusitis 07/10/2017  . Right shoulder pain 09/02/2014   Past Surgical History:  Procedure Laterality Date  . APPENDECTOMY    . CATARACT EXTRACTION W/PHACO Right 07/08/2013   Procedure: CATARACT EXTRACTION PHACO AND INTRAOCULAR LENS PLACEMENT (IOC);  Surgeon: Gemma Payor, MD;  Location: [facility];  Service: Ophthalmology;  Laterality: Right;  CDE 26.34  . CORONARY ANGIOPLASTY     stents x4  . RIGHT/LEFT HEART CATH AND CORONARY ANGIOGRAPHY N/A 07/04/2020   Procedure:  RIGHT/LEFT HEART CATH AND CORONARY ANGIOGRAPHY;  Surgeon: Dolores Patty, MD;  [facility]: [facility];  Service: Cardiovascular;  Laterality: N/A;  . ROTATOR CUFF REPAIR Right   . Undescended testicle surgery      Family History  Problem Relation Age of Onset  . Diabetes Father   . Stroke Father   . Colon cancer Neg Hx   . Rectal cancer Neg Hx   . Stomach cancer Neg Hx     Medications- reviewed and updated Current Outpatient Medications  Medication Sig Dispense Refill  . aspirin EC 81 MG tablet Take 81 mg by mouth daily.    Marland Kitchen atorvastatin (LIPITOR) 80 MG tablet Take 1 tablet (80 mg total) by mouth daily. 90 tablet 3  . digoxin (LANOXIN) 0.125 MG tablet Take 1 tablet (0.125 mg total) by mouth daily. 90 tablet 1  . furosemide (LASIX) 20 MG tablet Take 1 tablet (20 mg total) by mouth daily. 90 tablet 3  . isosorbide mononitrate (IMDUR) 30 MG 24 hr tablet Take 30 mg by mouth daily.    Marland Kitchen loratadine (CLARITIN) 10 MG tablet Take 10 mg by mouth daily as needed for allergies.    . metoprolol succinate (TOPROL-XL) 50 MG 24 hr tablet Take 50 mg by mouth daily with supper.    . warfarin (COUMADIN) 5 MG tablet Take 1/2 tablet daily except 1 tablet on Sundays and Wednesdays or as directed 70 tablet 3   No  current facility-administered medications for this visit.    Allergies-reviewed and updated No Known Allergies  Social History   Social History Narrative   Married 1964. 2 kids-son, daughter. 5 grandkids.       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      Hobbies: walk 8 miles a day, neighborhood handyman   Objective  Objective:  BP 122/82   Pulse 75   Temp (!) 97.2 F (36.2 C) (Temporal)   Resp 18   Ht 5\' 7"  (1.702 m)   Wt 140 lb 6.4 oz (63.7 kg)   SpO2 99%   BMI 21.99 kg/m  Gen: NAD, resting comfortably HEENT: Mucous membranes are moist. Oropharynx normal Neck: no thyromegaly CV: regular rate (either irregularly irregular or regular with ectopic beats) no murmurs rubs or  gallops Lungs: CTAB no crackles, wheeze, rhonchi Abdomen: soft/nontender/nondistended/normal bowel sounds. No rebound or guarding.  Ext: no edema Skin: warm, dry Neuro: grossly normal, moves all extremities, PERRLA   Assessment and Plan  79 y.o. male presenting for annual physical.  Health Maintenance counseling: 1. Anticipatory guidance: Patient counseled regarding regular dental exams - dentures, eye exams - advised yearly,  avoiding smoking and second hand smoke, limiting alcohol to 2 beverages per day- 2 or less per month and none in winter which is ideal with some fatty liver 2. Risk factor reduction:  Advised patient of need for regular exercise and diet rich and fruits and vegetables to reduce risk of heart attack and stroke. Exercise-  Still walking at least 6 miles a day- wife thinks OCD related- feels good with this. Diet-slight weight loss- discussed do not want him to lose anymore- doing more greens on warfarin and thinks this contributed.  Wt Readings from Last 3 Encounters:  07/19/20 140 lb 6.4 oz (63.7 kg)  07/04/20 145 lb (65.8 kg)  06/20/20 144 lb (65.3 kg)  3. Immunizations/screenings/ancillary studies- discussed shingrix at pharmacy  Immunization History  Administered Date(s) Administered  . Influenza Whole 06/04/2007, 06/07/2009, 05/03/2010  . Influenza, High Dose Seasonal PF 05/10/2013, 05/25/2016, 04/30/2018, 05/25/2019, 05/03/2020  . Influenza-Unspecified 05/14/2014, 06/21/2015, 05/13/2017, 04/30/2018  . Moderna Sars-Covid-2 Vaccination 08/28/2019, 09/27/2019, 06/02/2020  . Pneumococcal Conjugate-13 06/21/2015  . Pneumococcal Polysaccharide-23 06/04/2007  . Tdap 06/11/2012  4. Prostate cancer screening-  Past age based screening recommendations. No change in urinary symptoms other than due to lasix  Lab Results  Component Value Date   PSA 0.58 06/03/2011   PSA 0.48 04/26/2010   PSA 0.51 05/26/2008   5. Colon cancer screening - 2015 colonoscopy and was told no  repeat due to age- had seen Dr. 2016. No blood in stool or melena 6. Skin cancer screening- did not see dermatology last year after referral- declines for now.  advised regular sunscreen use. Denies worrisome, changing, or new skin lesions.  7. Former smoker 7.5 pack years quit in 1992. Prior hematuria workup with urology. Recheck next year UA- No UA as had extensive workup within year 8. STD screening -only active with wife  Status of chronic or acute concerns   # Atrial fibrillation S: Rate controlled with metoprolol 50 mg extended release.  Also on digoxin recently started- has felt much better. HR was running in 100s prior to digoxin.  Anticoagulated with Coumadin with cardiology clinic A/P: Stable. Continue current medications. Glad he feels better on digoxin   #hypertension S: medication: metoprolol 50Mg  extended-release, imdur 30Mg , Lasix 20 mg (started about a month ago) BP Readings from Last 3 Encounters:  07/19/20 122/82  07/04/20  94/61  06/20/20 104/68  A/P: Stable. Continue current medications.   #CAD-compliant with aspirin.  Denies chest pain or shortness of breath on Imdur 30 mg.  Right and left heart catheterization July 04, 2020-mild progression since 2005 but well collateralized.  Left ventricular dysfunction out of proportion to CAD-plan cardiac MRI which has not been completed yet- has cardiology visit on 28th #Cardiomyopathy-unfortunately EF has progressed to 20 to 25%-patient does feel better on digoxin as thought that some of reduced EF was related to uncontrolled A. fib.  Did not tolerate Entresto in the past even low-dose.  Has not been on Aldactone yet. #hyperlipidemia S: Medication: Atorvastatin 80Mg  Lab Results  Component Value Date   CHOL 127 07/13/2019   HDL 38.30 (L) 07/13/2019   LDLCALC 75 07/13/2019   LDLDIRECT 60.0 07/10/2017   TRIG 68.0 07/13/2019   CHOLHDL 3 07/13/2019   A/P: for lipids- Hopefully LDL has improved with atorvastatin up to 80 mg  from 40 mg-update lipid panel today.   CAD- stable asymptomatic  Cardiomyopathy- improved with digoxin.   # Depression- no recent recurrence. Was when wife was ill.  Depression screen [facility] 2/9 07/19/2020 07/13/2019 07/10/2018  Decreased Interest 0 0 0  Down, Depressed, Hopeless 0 0 0  PHQ - 2 Score 0 0 0  Altered sleeping - 0 0  Tired, decreased energy - 0 0  Change in appetite - 0 0  Feeling bad or failure about yourself  - 0 0  Trouble concentrating - 0 0  Moving slowly or fidgety/restless - 0 0  Suicidal thoughts - 0 0  PHQ-9 Score - 0 0  Difficult doing work/chores - Not difficult at all Not difficult at all   #Pulmonary nodule noted on CT imaging for hematuria work-up.  Plan to repeat March 2022-set a reminder to check on this/order this in 4 months. 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 stable  #Prediabetes/hyperglycemia-A1c has been elevated in the past.  Trend with labs today.  Continue healthy eating/regular exercise  Lab Results  Component Value Date   HGBA1C 5.8 07/13/2019   HGBA1C 5.7 12/05/2008   HGBA1C 5.8 09/15/2007   #had cBC recently. CMP in October but biliruin hair high and sodium slightly low- recheck today for stability. Has had signs of fatty liver in past   Recommended follow up: Return in about 1 year (around 07/19/2021) for physical or sooner if needed. Future Appointments  Date Time Provider Department Center  07/19/2020  3:45 PM [email or web address]  08/01/2020 10:00 AM 08/03/2020., NP [facility]   Lab/Order associations: fasting   ICD-10-CM   1. Preventative health care  Z00.00   2. Essential hypertension  I10   3. Mixed hyperlipidemia  E78.2   4. Pulmonary nodule  R91.1   5. Coronary artery disease of native artery of native heart with stable angina pectoris (HCC)  I25.118   6.  Longstanding persistent atrial fibrillation (HCC)  I48.11   7. Hyperglycemia  R73.9     No orders of the defined types were placed in this encounter.   Return precautions advised.  Netta Neat, MD

## 2020-07-18 NOTE — Patient Instructions (Addendum)
Please stop by lab before you go If you have mychart- we will send your results within 3 business days of [place] receiving them.  If you do not have mychart- we will call you about results within 5 business days of [place] receiving them.  *please note we are currently using [facility] which has a longer processing time than [place] typically so labs may not come back as quickly as in the past *please also note that you will see labs on mychart as soon as they post. I will later go in and write notes on them- will say "notes from Dr. Durene Cal"  Consider scheduling a wellness visit with our nurse specialist Inetta Fermo before you leave today  Also can schedule your 1 year physical before you leave  Please check with your pharmacy to see if they have the shingrix vaccine. If they do- please get this immunization and update [place] by phone call or mychart with dates you receive the vaccine

## 2020-07-19 ENCOUNTER — Ambulatory Visit (INDEPENDENT_AMBULATORY_CARE_PROVIDER_SITE_OTHER): Payer: Medicare Other | Admitting: *Deleted

## 2020-07-19 ENCOUNTER — Other Ambulatory Visit: Payer: Self-pay

## 2020-07-19 ENCOUNTER — Encounter: Payer: Self-pay | Admitting: Family Medicine

## 2020-07-19 ENCOUNTER — Ambulatory Visit (INDEPENDENT_AMBULATORY_CARE_PROVIDER_SITE_OTHER): Payer: Medicare Other | Admitting: Family Medicine

## 2020-07-19 VITALS — BP 122/82 | HR 75 | Temp 97.2°F | Resp 18 | Ht 67.0 in | Wt 140.4 lb

## 2020-07-19 DIAGNOSIS — I25118 Atherosclerotic heart disease of native coronary artery with other forms of angina pectoris: Secondary | ICD-10-CM

## 2020-07-19 DIAGNOSIS — R739 Hyperglycemia, unspecified: Secondary | ICD-10-CM | POA: Diagnosis not present

## 2020-07-19 DIAGNOSIS — Z5181 Encounter for therapeutic drug level monitoring: Secondary | ICD-10-CM

## 2020-07-19 DIAGNOSIS — E782 Mixed hyperlipidemia: Secondary | ICD-10-CM

## 2020-07-19 DIAGNOSIS — R911 Solitary pulmonary nodule: Secondary | ICD-10-CM

## 2020-07-19 DIAGNOSIS — I480 Paroxysmal atrial fibrillation: Secondary | ICD-10-CM | POA: Diagnosis not present

## 2020-07-19 DIAGNOSIS — I4811 Longstanding persistent atrial fibrillation: Secondary | ICD-10-CM

## 2020-07-19 DIAGNOSIS — I10 Essential (primary) hypertension: Secondary | ICD-10-CM | POA: Diagnosis not present

## 2020-07-19 DIAGNOSIS — Z0000 Encounter for general adult medical examination without abnormal findings: Secondary | ICD-10-CM

## 2020-07-19 LAB — POCT INR: INR: 3.2 — AB (ref 2.0–3.0)

## 2020-07-19 NOTE — Patient Instructions (Signed)
Decrease warfarin to 1/2 tablet daily except 1 tablet on Mondays and Fridays Continue greens/salads Recheck INR in 3 weeks

## 2020-07-20 LAB — COMPLETE METABOLIC PANEL WITH GFR
AG Ratio: 1.3 (calc) (ref 1.0–2.5)
ALT: 35 U/L (ref 9–46)
AST: 33 U/L (ref 10–35)
Albumin: 3.9 g/dL (ref 3.6–5.1)
Alkaline phosphatase (APISO): 70 U/L (ref 35–144)
BUN: 15 mg/dL (ref 7–25)
CO2: 33 mmol/L — ABNORMAL HIGH (ref 20–32)
Calcium: 9.3 mg/dL (ref 8.6–10.3)
Chloride: 100 mmol/L (ref 98–110)
Creat: 1.04 mg/dL (ref 0.70–1.11)
GFR, Est African American: 78 mL/min/{1.73_m2} (ref >=60)
GFR, Est Non African American: 67 mL/min/{1.73_m2} (ref >=60)
Globulin: 2.9 g/dL (calc) (ref 1.9–3.7)
Glucose, Bld: 91 mg/dL (ref 65–99)
Potassium: 4.1 mmol/L (ref 3.5–5.3)
Sodium: 140 mmol/L (ref 135–146)
Total Bilirubin: 0.8 mg/dL (ref 0.2–1.2)
Total Protein: 6.8 g/dL (ref 6.1–8.1)

## 2020-07-20 LAB — LIPID PANEL
Cholesterol: 112 mg/dL (ref <200)
HDL: 30 mg/dL — ABNORMAL LOW (ref >=40)
LDL Cholesterol (Calc): 66 mg/dL (calc)
Non-HDL Cholesterol (Calc): 82 mg/dL (calc) (ref <130)
Total CHOL/HDL Ratio: 3.7 (calc) (ref <5.0)
Triglycerides: 79 mg/dL (ref <150)

## 2020-07-20 LAB — HEMOGLOBIN A1C
Hgb A1c MFr Bld: 6.1 % of total Hgb — ABNORMAL HIGH (ref <5.7)
Mean Plasma Glucose: 128 mg/dL
eAG (mmol/L): 7.1 mmol/L

## 2020-07-31 NOTE — Progress Notes (Signed)
Cardiology Office Note  Date: 08/01/2020   ID: Martin James 1940-11-09, MRN [id number]  PCP:  [name], MD  Cardiologist:  Nona Dell, MD Electrophysiologist:  None   Chief Complaint: Follow up cardiac catheterization  History of Present Illness: Martin James is a 79 y.o. male with a history of ischemic cardiomyopathy, atrial fibrillation, CAD, HTN, HLD.  Last encounter with Dr. Diona Browner 06/20/2020.  He had been having episodes of orthopnea, vague sense of palpitations over the previous 3 months.  His heart rate has been well controlled on current medical therapy including Toprol-XL with Coumadin.  His CHA2DS2-VASc score was 4.  He was started on amiodarone in anticipation of potential cardioversion.  Plan was to continue Toprol-XL 50 mg daily and starting Lanoxin 0.125 mg daily with continuation of Imdur and Lasix.  Plan was to schedule right and left heart cath with Dr. Gala Romney to better understand the coronary anatomy and hemodynamics.  Plan was to hold off on cardioversion at that time and stop amiodarone.  Focus was to be towards heart rate control and anticoagulation.  He was continuing aspirin and statin therapy.  Cardiac catheterization was performed on 07/04/2020 demonstrated mid RCA to distal RCA lesion 100% stenosed, mid circumflex and distal circumflex 99% stenosis, proximal LAD to mid LAD 40% stenosis, proximal circumflex to mid circumflex 30% stenosis.  Dr. Prescott Gum assessment was CAD had progressed mildly since 2005 but overall not too much change and he was well collateralized.  He does LV dysfunction was well out of proportion to CAD and would need a cardiac MRI to fully evaluate.  Filling pressures were low and suggested he may need to cut back on Lasix a bit.   He is here today for follow-up status post recent cardiac catheterization.  States he is feeling better than he has felt in quite some time.  He states he walked approximately 11 miles  yesterday without any chest pain, shortness of breath, dizziness, palpitations, activity intolerance, claudication or any other issues.  He thinks the addition of digoxin may be helping him some.  Denies any issues with catheterization access site.  Has good pulses.  No lower extremity edema.  Blood pressure 118/66.   Past Medical History:  Diagnosis Date  . Atrial fibrillation (HCC)   . CAD (coronary artery disease)    BMS x 2 distal RCA 1998; DES x 2 mid to distal RCA 2005, had occluded distal circumflex with left to left collaterals as well  . Cataract   . Depression   . Essential hypertension   . Hyperlipidemia   . Insomnia   . Ischemic cardiomyopathy   . Sinus congestion     Past Surgical History:  Procedure Laterality Date  . APPENDECTOMY    . CATARACT EXTRACTION W/PHACO Right 07/08/2013   Procedure: CATARACT EXTRACTION PHACO AND INTRAOCULAR LENS PLACEMENT (IOC);  Surgeon: Gemma Payor, MD;  Location: [facility];  Service: Ophthalmology;  Laterality: Right;  CDE 26.34  . CORONARY ANGIOPLASTY     stents x4  . RIGHT/LEFT HEART CATH AND CORONARY ANGIOGRAPHY N/A 07/04/2020   Procedure: RIGHT/LEFT HEART CATH AND CORONARY ANGIOGRAPHY;  Surgeon: Dolores Patty, MD;  Location: [facility];  Service: Cardiovascular;  Laterality: N/A;  . ROTATOR CUFF REPAIR Right   . Undescended testicle surgery      Current Outpatient Medications  Medication Sig Dispense Refill  . aspirin EC 81 MG tablet Take 81 mg by mouth daily.    Marland Kitchen  atorvastatin (LIPITOR) 80 MG tablet Take 1 tablet (80 mg total) by mouth daily. 90 tablet 3  . digoxin (LANOXIN) 0.125 MG tablet Take 1 tablet (0.125 mg total) by mouth daily. 90 tablet 1  . furosemide (LASIX) 20 MG tablet Take 1 tablet (20 mg total) by mouth daily. 90 tablet 3  . isosorbide mononitrate (IMDUR) 30 MG 24 hr tablet Take 30 mg by mouth daily.    Marland Kitchen loratadine (CLARITIN) 10 MG tablet Take 10 mg by mouth daily as needed for allergies.    . metoprolol  succinate (TOPROL-XL) 50 MG 24 hr tablet Take 50 mg by mouth daily with supper.    . warfarin (COUMADIN) 5 MG tablet Take 1/2 tablet daily except 1 tablet on Sundays and Wednesdays or as directed 70 tablet 3   No current facility-administered medications for this visit.   Allergies:  Patient has no known allergies.   Social History: The patient  reports that he quit smoking about 30 years ago. His smoking use included cigarettes. He has a 7.50 pack-year smoking history. He has never used smokeless tobacco. He reports current alcohol use of about 2.0 standard drinks of alcohol per week. He reports that he does not use drugs.   Family History: The patient's family history includes Diabetes in his father; Stroke in his father.   ROS:  Please see the history of present illness. Otherwise, complete review of systems is positive for none.  All other systems are reviewed and negative.   Physical Exam: VS:  BP 118/66   Pulse 74   Ht 5' 8.5" (1.74 m)   Wt 142 lb (64.4 kg)   SpO2 98%   BMI 21.28 kg/m , BMI Body mass index is 21.28 kg/m.  Wt Readings from Last 3 Encounters:  08/01/20 142 lb (64.4 kg)  07/19/20 140 lb 6.4 oz (63.7 kg)  07/04/20 145 lb (65.8 kg)    General: Patient appears comfortable at rest. Neck: Supple, no elevated JVP or carotid bruits, no thyromegaly. Lungs: Clear to auscultation, nonlabored breathing at rest. Cardiac: Irregularly irregular rate and rhythm, no S3 or significant systolic murmur, no pericardial rub. Extremities: No pitting edema, distal pulses 2+. Skin: Warm and dry. Musculoskeletal: No kyphosis. Neuropsychiatric: Alert and oriented x3, affect grossly appropriate.  ECG:  EKG 06/08/2020 atrial flutter with variable AV block with premature ventricular or aberrantly conducted complexes, septal infarct, age undetermined, ST and T wave abnormality consider inferior ischemia, ST and T wave abnormality consider anterolateral ischemia rate of  seventy-three.  Recent Labwork: 07/03/2020: Platelets 193 07/04/2020: Hemoglobin 13.6 07/19/2020: ALT 35; AST 33; BUN 15; Creat 1.04; Potassium 4.1; Sodium 140     Component Value Date/Time   CHOL 112 07/19/2020 0835   TRIG 79 07/19/2020 0835   HDL 30 (L) 07/19/2020 0835   CHOLHDL 3.7 07/19/2020 0835   VLDL 13.6 07/13/2019 1013   LDLCALC 66 07/19/2020 0835   LDLDIRECT 60.0 07/10/2017 0905    Other Studies Reviewed Today:  Echocardiogram 06/13/2020   1. Left ventricular ejection fraction, by estimation, is 20 to 25%. The left ventricle has severely decreased function. The left ventricle demonstrates regional wall motion abnormalities (see scoring diagram/findings for description). There is mild asymmetric left ventricular hypertrophy of the septal segment. Left ventricular diastolic parameters are indeterminate. 2. Right ventricular systolic function is mildly reduced. The right ventricular size is normal. There is moderately elevated pulmonary artery systolic pressure. The estimated right ventricular systolic pressure is 47.4 mmHg. 3. Left atrial size was  severely dilated. 4. Right atrial size was moderately dilated. 5. The mitral valve is grossly normal. Mild to moderate mitral valve regurgitation. 6. Tricuspid valve regurgitation is moderate. 7. The aortic valve is tricuspid. There is moderate calcification of the aortic valve. Aortic valve regurgitation is mild. Mild to moderate aortic valve stenosis. Aortic valve mean gradient measures 11.5 mmHg. Aortic valve Vmax measures 2.16 m/s. Dimentionless index 0.43. 8. The inferior vena cava is normal in size with greater than 50% respiratory variability, suggesting right atrial pressure of 3 mmHg. Comparison(s): Echocardiogram done 10/01/18 showed an EF of 35-40%.     Cardiac catheterization 07/04/2020   Mid RCA to Dist RCA lesion is 100% stenosed.  Mid Cx to Dist Cx lesion is 99% stenosed.  Prox LAD to Mid LAD lesion is  40% stenosed.  Prox Cx to Mid Cx lesion is 30% stenosed.   Findings:  Ao = 109/60 LV = 113/5 RA =  1 RV = 25/1 PA = 24/7 (14) PCW = 7 Fick cardiac output/index = 4.7/2.6 PVR = 1.5 WU FA sat = 99% PA sat = 71%, 76%  Assessment: 1.  2v CAD with total occlusion of mRCA with good L -> R collaterals and high grade lesion in distal AV-groove LCX with L->L collaterals 2. EF 20% 3. Low filling pressures with normal cardiac output  Plan/Discussion:  CAD has progressed mildly since 2005 but overall not too much change and he is well collateralized. LV dysfunction well out of proportion to CAD. Will need cMRI to full evaluate.  Filling pressures low. May need to cut back lasix a bit.     Diagnostic Dominance: Right      Assessment and Plan:  1. Ischemic cardiomyopathy   2. Atrial fibrillation, unspecified type (HCC)   3. Essential hypertension   4. Mixed hyperlipidemia    1. Ischemic cardiomyopathy Recent cardiac catheterization 07/04/2020: Mid RCA to distal RCA 100%, mid CX to distal CX and 99%, proximal LAD to mid LAD 40%, proximal circumflex to mid circumflex 30%.  Two-vessel CAD with total occlusion of mid RCA with good left-to-right collaterals and high-grade lesion in distal AV groove LCx with L to L collaterals.  EF 20%.  Patient states he has felt better recently than he has in quite some time.  He attributes this to addition of digoxin.  Continue aspirin 81 mg, digoxin 0.125 mg p.o. daily.  Imdur 30 mg daily.  Toprol-XL 50 mg daily.  States he walked 11 miles yesterday without any anginal or exertional symptoms.  Continue Lasix 20 mg daily.  2. Atrial fibrillation, unspecified type (HCC) Heart rate today controlled with rate of seventy-four and irregularly irregular.  Continue Toprol-XL 50 mg daily.  Continue Coumadin as directed by Coumadin clinic.  3. Essential hypertension Pressures well controlled at 118/66.  Continue Toprol-XL 50 mg p.o. daily.  Continue Lasix  20 mg daily.  4. Mixed hyperlipidemia Continue atorvastatin 80 mg p.o. daily.  Medication Adjustments/Labs and Tests Ordered: Current medicines are reviewed at length with the patient today.  Concerns regarding medicines are outlined above.    Disposition: Follow-up with Dr. Diona Browner or APP 6 months  Signed, Rennis Harding, NP 08/01/2020 10:24 AM    [facility] at [facility] [address] [place], [place], [place] [address] Phone: [phone]; Fax: [phone]

## 2020-08-01 ENCOUNTER — Encounter: Payer: Self-pay | Admitting: Family Medicine

## 2020-08-01 ENCOUNTER — Other Ambulatory Visit: Payer: Self-pay | Admitting: Family Medicine

## 2020-08-01 ENCOUNTER — Other Ambulatory Visit: Payer: Self-pay

## 2020-08-01 ENCOUNTER — Ambulatory Visit: Payer: Medicare Other | Admitting: Family Medicine

## 2020-08-01 VITALS — BP 118/66 | HR 74 | Ht 68.5 in | Wt 142.0 lb

## 2020-08-01 DIAGNOSIS — I10 Essential (primary) hypertension: Secondary | ICD-10-CM

## 2020-08-01 DIAGNOSIS — I255 Ischemic cardiomyopathy: Secondary | ICD-10-CM | POA: Diagnosis not present

## 2020-08-01 DIAGNOSIS — I4891 Unspecified atrial fibrillation: Secondary | ICD-10-CM | POA: Diagnosis not present

## 2020-08-01 DIAGNOSIS — E782 Mixed hyperlipidemia: Secondary | ICD-10-CM | POA: Diagnosis not present

## 2020-08-01 MED ORDER — DIGOXIN 125 MCG PO TABS: 0.1250 mg | ORAL_TABLET | Freq: Every day | ORAL | 3 refills | Status: DC

## 2020-08-01 MED ORDER — ISOSORBIDE MONONITRATE ER 30 MG PO TB24: 30.0000 mg | ORAL_TABLET | Freq: Every day | ORAL | 3 refills | Status: DC

## 2020-08-01 MED ORDER — METOPROLOL SUCCINATE ER 50 MG PO TB24: 50.0000 mg | ORAL_TABLET | Freq: Every day | ORAL | 3 refills | Status: DC

## 2020-08-01 NOTE — Patient Instructions (Signed)
Medication Instructions:  Your physician recommends that you continue on your current medications as directed. Please refer to the Current Medication list given to you today.  *If you need a refill on your cardiac medications before your next appointment, please call your pharmacy*   Lab Work: None   If you have labs (blood work) drawn today and your tests are completely normal, you will receive your results only by: Marland Kitchen MyChart Message (if you have MyChart) OR . A paper copy in the mail If you have any lab test that is abnormal or we need to change your treatment, we will call you to review the results.   Testing/Procedures: None    Follow-Up: At [facility], you and your health needs are our priority.  As part of our continuing mission to provide you with exceptional heart care, we have created designated Provider Care Teams.  These Care Teams include your primary Cardiologist (physician) and Advanced Practice Providers (APPs -  Physician Assistants and Nurse Practitioners) who all work together to provide you with the care you need, when you need it.  We recommend signing up for the patient portal called "MyChart".  Sign up information is provided on this After Visit Summary.  MyChart is used to connect with patients for Virtual Visits (Telemedicine).  Patients are able to view lab/test results, encounter notes, upcoming appointments, etc.  Non-urgent messages can be sent to your provider as well.   To learn more about what you can do with MyChart, go to [email or web address].    Your next appointment:   6 month(s)  The format for your next appointment:   In Person  Provider:   Nona Dell, MD or Nena Polio, NP   Other Instructions Thank you for choosing [facility]!

## 2020-08-09 ENCOUNTER — Ambulatory Visit (INDEPENDENT_AMBULATORY_CARE_PROVIDER_SITE_OTHER): Payer: Medicare Other | Admitting: *Deleted

## 2020-08-09 DIAGNOSIS — I480 Paroxysmal atrial fibrillation: Secondary | ICD-10-CM | POA: Diagnosis not present

## 2020-08-09 DIAGNOSIS — Z5181 Encounter for therapeutic drug level monitoring: Secondary | ICD-10-CM

## 2020-08-09 LAB — POCT INR: INR: 1.9 — AB (ref 2.0–3.0)

## 2020-08-09 NOTE — Patient Instructions (Signed)
Increase warfarin to 1/2 tablet daily except 1 tablet on Mondays, Wednesdays and Fridays Continue greens/salads Recheck INR in 3 weeks

## 2020-08-29 ENCOUNTER — Telehealth: Payer: Self-pay | Admitting: Family Medicine

## 2020-08-29 NOTE — Telephone Encounter (Signed)
Left message for patient to call back and schedule Medicare Annual Wellness Visit (AWV) either virtually or in office.   Last AWV  07/13/2019 please schedule at anytime    This should be a 45 minute visit.

## 2020-08-30 ENCOUNTER — Ambulatory Visit (INDEPENDENT_AMBULATORY_CARE_PROVIDER_SITE_OTHER): Payer: Medicare Other | Admitting: *Deleted

## 2020-08-30 ENCOUNTER — Other Ambulatory Visit: Payer: Self-pay

## 2020-08-30 DIAGNOSIS — I480 Paroxysmal atrial fibrillation: Secondary | ICD-10-CM | POA: Diagnosis not present

## 2020-08-30 DIAGNOSIS — Z5181 Encounter for therapeutic drug level monitoring: Secondary | ICD-10-CM

## 2020-08-30 LAB — POCT INR: INR: 2.7 (ref 2.0–3.0)

## 2020-08-30 NOTE — Patient Instructions (Signed)
Continue 1/2 tablet daily except 1 tablet on Mondays, Wednesdays and Fridays Continue greens/salads Recheck INR in 4 weeks

## 2020-09-04 ENCOUNTER — Ambulatory Visit: Payer: Medicare Other | Admitting: Family Medicine

## 2020-09-12 ENCOUNTER — Telehealth: Payer: Self-pay

## 2020-09-12 NOTE — Telephone Encounter (Signed)
Nurse Assessment Nurse: Anner Crete, RN, Olegario Messier Date/Time (Eastern Time): 09/12/2020 8:34:13 AM Confirm and document reason for call. If symptomatic, describe symptoms. ---Calling is calling from [facility] and has a patient that needs to be triaged. He is dizzy ( vertigo) and throughout the past few days he has had weird feeling on his left side of face ( eye to check ) , but it is not numb, he still has feeling but can't really describe it other than a tingling, pins and needles type feeling. denies dental pain and / or dental procedures recently, no trauma . Does the patient have any new or worsening symptoms? ---Yes Will a triage be completed? ---Yes Related visit to physician within the last 2 weeks? ---No Does the PT have any chronic conditions? (i.e. diabetes, asthma, this includes High risk factors for pregnancy, etc.) ---Yes List chronic conditions. ---HTN Is this a behavioral health or substance abuse call? ---No Guidelines Guideline Title Affirmed Question Affirmed Notes Nurse Date/Time (Eastern Time) Dizziness - Vertigo SEVERE dizziness (vertigo) (e.g., unable to walk without assistance) Anner Crete, RN, Olegario Messier 09/12/2020 8:36:34 AM Disp. Time Lamount Cohen Time) Disposition Final User PLEASE NOTE: All timestamps contained within this report are represented as [place] Standard Time. CONFIDENTIALTY NOTICE: This fax transmission is intended only for the addressee. It contains information that is legally privileged, confidential or otherwise protected from use or disclosure. If you are not the intended recipient, you are strictly prohibited from reviewing, disclosing, copying using or disseminating any of this information or taking any action in reliance on or regarding this information. If you have received this fax in error, please notify us immediately by telephone so that we can arrange for its return to [place]. Phone: [phone], Toll-Free: [phone], Fax: [phone] Page: 2 of 2 Call  Id: [id number] 09/12/2020 8:39:32 AM Go to ED Now (or PCP triage) Yes Anner Crete, RN, Sammuel Bailiff Disagree/Comply Comply Caller Understands Yes PreDisposition Call Doctor Care Advice Given Per Guideline GO TO ED NOW (OR PCP TRIAGE): * IF NO PCP (PRIMARY CARE PROVIDER) SECOND-LEVEL TRIAGE: You need to be seen within the next hour. Go to the ED/UCC at _____________ Hospital. Leave as soon as you can. * Another adult should drive. * If the patient cannot walk at all because of severe dizziness, then the patient may need to be transported via ambulance. CARE ADVICE given per Dizziness - Vertigo (Adult) guideline. Referrals GO TO FACILITY OTHER - SPECIF

## 2020-09-13 NOTE — Telephone Encounter (Signed)
LVM asking to call back ,

## 2020-09-13 NOTE — Telephone Encounter (Signed)
Please schedule pt for f/u

## 2020-09-27 ENCOUNTER — Ambulatory Visit (INDEPENDENT_AMBULATORY_CARE_PROVIDER_SITE_OTHER): Payer: Medicare Other | Admitting: *Deleted

## 2020-09-27 DIAGNOSIS — I480 Paroxysmal atrial fibrillation: Secondary | ICD-10-CM | POA: Diagnosis not present

## 2020-09-27 DIAGNOSIS — Z5181 Encounter for therapeutic drug level monitoring: Secondary | ICD-10-CM

## 2020-09-27 LAB — POCT INR: INR: 1.8 — AB (ref 2.0–3.0)

## 2020-09-27 NOTE — Patient Instructions (Signed)
Take warfarin 1 1/2 tablets tonight then resume 1/2 tablet daily except 1 tablet on Mondays, Wednesdays and Fridays Continue greens/salads Recheck INR in 3 weeks

## 2020-10-19 ENCOUNTER — Ambulatory Visit (INDEPENDENT_AMBULATORY_CARE_PROVIDER_SITE_OTHER): Payer: Medicare Other | Admitting: *Deleted

## 2020-10-19 DIAGNOSIS — Z5181 Encounter for therapeutic drug level monitoring: Secondary | ICD-10-CM | POA: Diagnosis not present

## 2020-10-19 DIAGNOSIS — I480 Paroxysmal atrial fibrillation: Secondary | ICD-10-CM

## 2020-10-19 LAB — POCT INR: INR: 1.8 — AB (ref 2.0–3.0)

## 2020-10-19 NOTE — Patient Instructions (Signed)
Take warfarin 1 tablet tonight then increase dose to 1 tablet daily except 1/2 tablet Sundays, Tuesdays and Thursdays Continue greens/salads Recheck INR in 3 weeks

## 2020-10-25 ENCOUNTER — Other Ambulatory Visit: Payer: Self-pay | Admitting: Family Medicine

## 2020-11-06 ENCOUNTER — Telehealth: Payer: Self-pay | Admitting: Family Medicine

## 2020-11-06 DIAGNOSIS — R911 Solitary pulmonary nodule: Secondary | ICD-10-CM

## 2020-11-06 NOTE — Telephone Encounter (Signed)
Lung nodule follow up needed- I ordered this- team please let patient know he should hear within 2 weeks and if not to give [place] a call

## 2020-11-07 NOTE — Telephone Encounter (Signed)
Called and lm on pt vm making him aware of the below.

## 2020-11-09 ENCOUNTER — Ambulatory Visit (INDEPENDENT_AMBULATORY_CARE_PROVIDER_SITE_OTHER): Payer: Medicare Other | Admitting: *Deleted

## 2020-11-09 ENCOUNTER — Other Ambulatory Visit: Payer: Self-pay

## 2020-11-09 DIAGNOSIS — I480 Paroxysmal atrial fibrillation: Secondary | ICD-10-CM

## 2020-11-09 DIAGNOSIS — Z5181 Encounter for therapeutic drug level monitoring: Secondary | ICD-10-CM

## 2020-11-09 LAB — POCT INR: INR: 2.6 (ref 2.0–3.0)

## 2020-11-09 NOTE — Patient Instructions (Signed)
Continue warfarin 1 tablet daily except 1/2 tablet Sundays, Tuesdays and Thursdays Continue greens/salads Recheck INR in 4 weeks

## 2020-11-22 ENCOUNTER — Encounter: Payer: Self-pay | Admitting: Family Medicine

## 2020-12-02 ENCOUNTER — Encounter: Payer: Self-pay | Admitting: Family Medicine

## 2020-12-07 ENCOUNTER — Ambulatory Visit (INDEPENDENT_AMBULATORY_CARE_PROVIDER_SITE_OTHER): Payer: Medicare Other | Admitting: *Deleted

## 2020-12-07 DIAGNOSIS — Z5181 Encounter for therapeutic drug level monitoring: Secondary | ICD-10-CM | POA: Diagnosis not present

## 2020-12-07 DIAGNOSIS — I480 Paroxysmal atrial fibrillation: Secondary | ICD-10-CM | POA: Diagnosis not present

## 2020-12-07 LAB — POCT INR: INR: 2 (ref 2.0–3.0)

## 2020-12-07 NOTE — Patient Instructions (Signed)
Take warfarin 1 tablet tonight then resume 1 tablet daily except 1/2 tablet Sundays, Tuesdays and Thursdays Continue greens/salads Recheck INR in 4 weeks

## 2021-01-04 ENCOUNTER — Ambulatory Visit (INDEPENDENT_AMBULATORY_CARE_PROVIDER_SITE_OTHER): Payer: Medicare Other | Admitting: *Deleted

## 2021-01-04 DIAGNOSIS — I480 Paroxysmal atrial fibrillation: Secondary | ICD-10-CM | POA: Diagnosis not present

## 2021-01-04 DIAGNOSIS — Z5181 Encounter for therapeutic drug level monitoring: Secondary | ICD-10-CM | POA: Diagnosis not present

## 2021-01-04 LAB — POCT INR: INR: 2.1 (ref 2.0–3.0)

## 2021-01-04 MED ORDER — WARFARIN SODIUM 5 MG PO TABS: ORAL_TABLET | ORAL | 3 refills | Status: DC

## 2021-01-04 NOTE — Patient Instructions (Signed)
Continue warfarin 1 tablet daily except 1/2 tablet Sundays, Tuesdays and Thursdays Continue greens/salads Recheck INR in 6 weeks

## 2021-01-27 ENCOUNTER — Other Ambulatory Visit: Payer: Self-pay | Admitting: Family Medicine

## 2021-02-11 NOTE — Progress Notes (Signed)
Cardiology Office Note  Date: 02/12/2021   ID: Martin James, DOB 05/14/1941, MRN [id number]  PCP:  Shelva MajesticHunter, Martin O, MD  Cardiologist:  Martin DellSamuel McDowell, MD Electrophysiologist:  None   Chief Complaint: 6 month follow up   History of Present Illness: Martin James is a 80 y.James. male with a history of ischemic cardiomyopathy, atrial fibrillation, CAD, HTN, HLD.  Last encounter with Dr. Diona BrownerMcDowell 06/20/2020.  He had been having episodes of orthopnea, vague sense of palpitations over the previous 3 months.  His heart rate has been well controlled on current medical therapy including Toprol-XL with Coumadin.  His CHA2DS2-VASc score was 4.  He was started on amiodarone in anticipation of potential cardioversion.  Plan was to continue Toprol-XL 50 mg daily and starting Lanoxin 0.125 mg daily with continuation of Imdur and Lasix.  Plan was to schedule right and left heart cath with Dr. Gala RomneyBensimhon to better understand the coronary anatomy and hemodynamics.  Plan was to hold off on cardioversion at that time and stop amiodarone.  Focus was to be towards heart rate control and anticoagulation.  He was continuing aspirin and statin therapy.  Cardiac catheterization was performed on 07/04/2020 demonstrated mid RCA to distal RCA lesion 100% stenosed, mid circumflex and distal circumflex 99% stenosis, proximal LAD to mid LAD 40% stenosis, proximal circumflex to mid circumflex 30% stenosis.  Dr. Prescott GumBensimhon's assessment was CAD had progressed mildly since 2005 but overall not too much change and he was well collateralized.  He does LV dysfunction was well out of proportion to CAD and would need a cardiac MRI to fully evaluate.  Filling pressures were low and suggested he may need to cut back on Lasix a bit.   He was last here for follow-up status post recent cardiac catheterization.  He stated he was feeling better than he had felt in quite some time.  He was walking approximately 11 miles daily without any chest  pain, shortness of breath, dizziness, palpitations, activity intolerance, claudication or any other issues.  He believed the addition of digoxin was helping him some.    He is here for 3441-month follow-up.  He continues to state he feels better than he has felt in quite a long time.  He continues to walk 10 to 11 miles each day.  He denies any anginal or exertional symptoms, SOB or DOE.  No orthostatic symptoms i.e. lightheadedness, dizziness, presyncopal or syncopal episodes, no CVA or TIA-like symptoms, no PND or orthopnea.  Denies any palpitations.  No bleeding issues.  No claudication-like symptoms, DVT or PE-like symptoms, or lower extremity edema.  He has lost a fair amount of weight since December 2021.  During that visit he weighed at 142.  Today's weight is 130.   Past Medical History:  Diagnosis Date   Atrial fibrillation (HCC)    CAD (coronary artery disease)    BMS x 2 distal RCA 1998; DES x 2 mid to distal RCA 2005, had occluded distal circumflex with left to left collaterals as well   Cataract    Depression    Essential hypertension    Hyperlipidemia    Insomnia    Ischemic cardiomyopathy    Sinus congestion     Past Surgical History:  Procedure Laterality Date   APPENDECTOMY     CATARACT EXTRACTION W/PHACO Right 07/08/2013   Procedure: CATARACT EXTRACTION PHACO AND INTRAOCULAR LENS PLACEMENT (IOC);  Surgeon: Gemma PayorKerry Hunt, MD;  Location: [facility];  Service: Ophthalmology;  Laterality: Right;  CDE 26.34   CORONARY ANGIOPLASTY     stents x4   RIGHT/LEFT HEART CATH AND CORONARY ANGIOGRAPHY N/A 07/04/2020   Procedure: RIGHT/LEFT HEART CATH AND CORONARY ANGIOGRAPHY;  Surgeon: Dolores Patty, MD;  Location: [facility];  Service: Cardiovascular;  Laterality: N/A;   ROTATOR CUFF REPAIR Right    Undescended testicle surgery      Current Outpatient Medications  Medication Sig Dispense Refill   aspirin EC 81 MG tablet Take 81 mg by mouth daily.     atorvastatin (LIPITOR)  80 MG tablet Take 1 tablet by mouth once daily 90 tablet 0   digoxin (LANOXIN) 0.125 MG tablet Take 1 tablet (0.125 mg total) by mouth daily. 90 tablet 3   furosemide (LASIX) 20 MG tablet Take 1 tablet (20 mg total) by mouth daily. 90 tablet 3   isosorbide mononitrate (IMDUR) 30 MG 24 hr tablet Take 1 tablet (30 mg total) by mouth daily. 90 tablet 3   loratadine (CLARITIN) 10 MG tablet Take 10 mg by mouth daily as needed for allergies.     metoprolol succinate (TOPROL-XL) 50 MG 24 hr tablet Take 1 tablet (50 mg total) by mouth daily with supper. 90 tablet 3   warfarin (COUMADIN) 5 MG tablet Take 1 tablet daily except 1/2 tablet on Sundays, Tuesdays and Thursdays or as directed 70 tablet 3   No current facility-administered medications for this visit.   Allergies:  Patient has no known allergies.   Social History: The patient  reports that he quit smoking about 30 years ago. His smoking use included cigarettes. He has a 7.50 pack-year smoking history. He has never used smokeless tobacco. He reports current alcohol use of about 2.0 standard drinks of alcohol per week. He reports that he does not use drugs.   Family History: The patient's family history includes Diabetes in his father; Stroke in his father.   ROS:  Please see the history of present illness. Otherwise, complete review of systems is positive for none.  All other systems are reviewed and negative.   Physical Exam: VS:  BP (!) 102/56   Pulse 69   Ht 5' 8.5" (1.74 m)   Wt 130 lb (59 kg)   SpO2 97%   BMI 19.48 kg/m , BMI Body mass index is 19.48 kg/m.  Wt Readings from Last 3 Encounters:  02/12/21 130 lb (59 kg)  08/01/20 142 lb (64.4 kg)  07/19/20 140 lb 6.4 oz (63.7 kg)    General: Patient appears comfortable at rest. Neck: Supple, no elevated JVP or carotid bruits, no thyromegaly. Lungs: Clear to auscultation, nonlabored breathing at rest. Cardiac: Irregularly irregular rate and rhythm, no S3 or significant systolic  murmur, no pericardial rub. Extremities: No pitting edema, distal pulses 2+. Skin: Warm and dry. Musculoskeletal: No kyphosis. Neuropsychiatric: Alert and oriented x3, affect grossly appropriate.  ECG:  EKG 06/08/2020 atrial flutter with variable AV block with premature ventricular or aberrantly conducted complexes, septal infarct, age undetermined, ST and T wave abnormality consider inferior ischemia, ST and T wave abnormality consider anterolateral ischemia rate of seventy-three.  Recent Labwork: 07/03/2020: Platelets 193 07/04/2020: Hemoglobin 13.6 07/19/2020: ALT 35; AST 33; BUN 15; Creat 1.04; Potassium 4.1; Sodium 140     Component Value Date/Time   CHOL 112 07/19/2020 0835   TRIG 79 07/19/2020 0835   HDL 30 (L) 07/19/2020 0835   CHOLHDL 3.7 07/19/2020 0835   VLDL 13.6 07/13/2019 1013   LDLCALC 66 07/19/2020 0835   LDLDIRECT  60.0 07/10/2017 0905    Other Studies Reviewed Today:  Echocardiogram 06/13/2020   1. Left ventricular ejection fraction, by estimation, is 20 to 25%. The left ventricle has severely decreased function. The left ventricle demonstrates regional wall motion abnormalities (see scoring diagram/findings for description). There is mild asymmetric left ventricular hypertrophy of the septal segment. Left ventricular diastolic parameters are indeterminate. 2. Right ventricular systolic function is mildly reduced. The right ventricular size is normal. There is moderately elevated pulmonary artery systolic pressure. The estimated right ventricular systolic pressure is 47.4 mmHg. 3. Left atrial size was severely dilated. 4. Right atrial size was moderately dilated. 5. The mitral valve is grossly normal. Mild to moderate mitral valve regurgitation. 6. Tricuspid valve regurgitation is moderate. 7. The aortic valve is tricuspid. There is moderate calcification of the aortic valve. Aortic valve regurgitation is mild. Mild to moderate aortic valve stenosis. Aortic  valve mean gradient measures 11.5 mmHg. Aortic valve Vmax measures 2.16 m/s. Dimentionless index 0.43. 8. The inferior vena cava is normal in size with greater than 50% respiratory variability, suggesting right atrial pressure of 3 mmHg. Comparison(s): Echocardiogram done 10/01/18 showed an EF of 35-40%.     Cardiac catheterization 07/04/2020   Mid RCA to Dist RCA lesion is 100% stenosed. Mid Cx to Dist Cx lesion is 99% stenosed. Prox LAD to Mid LAD lesion is 40% stenosed. Prox Cx to Mid Cx lesion is 30% stenosed.   Findings:   Ao = 109/60 LV = 113/5 RA =  1 RV = 25/1 PA = 24/7 (14) PCW = 7 Fick cardiac output/index = 4.7/2.6 PVR = 1.5 WU FA sat = 99% PA sat = 71%, 76%   Assessment: 1.  2v CAD with total occlusion of mRCA with good L -> R collaterals and high grade lesion in distal AV-groove LCX with L->L collaterals 2. EF 20% 3. Low filling pressures with normal cardiac output   Plan/Discussion:   CAD has progressed mildly since 2005 but overall not too much change and he is well collateralized. LV dysfunction well out of proportion to CAD. Will need cMRI to full evaluate.  Filling pressures low. May need to cut back lasix a bit.     Diagnostic Dominance: Right      Assessment and Plan:  1. Ischemic cardiomyopathy   2. Atrial fibrillation, unspecified type (HCC)   3. Essential hypertension   4. Mixed hyperlipidemia     1. Ischemic cardiomyopathy He is here today stating he feels better now than he has in quite some time.  He continues to attribute this to previous addition of digoxin.  Denies any SOB, DOE.  He is actually lost some weight and currently weighs at 130 pounds.  He denies any lower extremity edema.  Continue aspirin 81 mg, digoxin 0.125 mg p.James. daily.  Imdur 30 mg daily.  Toprol-XL 50 mg daily.  States he walked 11 miles yesterday without any anginal or exertional symptoms.  Continue Lasix 20 mg daily.  2. Atrial fibrillation, unspecified type  (HCC) Heart rate today controlled with rate of 69 and irregularly irregular.  Continue Toprol-XL 50 mg daily.  Continue Coumadin as directed by Coumadin clinic.  3. Essential hypertension Pressures well controlled at BP today 102/56.  Continue Toprol-XL 50 mg p.James. daily.  Continue Lasix 20 mg daily.  4. Mixed hyperlipidemia Continue atorvastatin 80 mg p.James. daily.  Last lipid panel 07/19/2020 with TC of 112, HDL 30, LDL 66.  Patient states he has some upcoming labs with  his primary care provider at next visit.  Medication Adjustments/Labs and Tests Ordered: Current medicines are reviewed at length with the patient today.  Concerns regarding medicines are outlined above.    Disposition: Follow-up with Dr. Diona Browner or APP 6 months  Signed, Rennis Harding, NP 02/12/2021 9:54 AM    [facility] [address] [place], [place], [place] [address] Phone: [phone]; Fax: [phone]

## 2021-02-12 ENCOUNTER — Ambulatory Visit: Payer: Medicare Other | Admitting: Family Medicine

## 2021-02-12 ENCOUNTER — Other Ambulatory Visit: Payer: Self-pay

## 2021-02-12 ENCOUNTER — Encounter: Payer: Self-pay | Admitting: Family Medicine

## 2021-02-12 VITALS — BP 102/56 | HR 69 | Ht 68.5 in | Wt 130.0 lb

## 2021-02-12 DIAGNOSIS — I4891 Unspecified atrial fibrillation: Secondary | ICD-10-CM | POA: Diagnosis not present

## 2021-02-12 DIAGNOSIS — I255 Ischemic cardiomyopathy: Secondary | ICD-10-CM | POA: Diagnosis not present

## 2021-02-12 DIAGNOSIS — E782 Mixed hyperlipidemia: Secondary | ICD-10-CM | POA: Diagnosis not present

## 2021-02-12 DIAGNOSIS — I10 Essential (primary) hypertension: Secondary | ICD-10-CM | POA: Diagnosis not present

## 2021-02-12 NOTE — Patient Instructions (Signed)
Medication Instructions:  Continue all current medications.   Labwork: none  Testing/Procedures: none  Follow-Up: 6 months   Any Other Special Instructions Will Be Listed Below (If Applicable).   If you need a refill on your cardiac medications before your next appointment, please call your pharmacy.  

## 2021-02-19 ENCOUNTER — Ambulatory Visit (INDEPENDENT_AMBULATORY_CARE_PROVIDER_SITE_OTHER): Payer: Medicare Other | Admitting: *Deleted

## 2021-02-19 ENCOUNTER — Other Ambulatory Visit: Payer: Self-pay

## 2021-02-19 DIAGNOSIS — I10 Essential (primary) hypertension: Secondary | ICD-10-CM | POA: Diagnosis not present

## 2021-02-19 DIAGNOSIS — Z5181 Encounter for therapeutic drug level monitoring: Secondary | ICD-10-CM

## 2021-02-19 DIAGNOSIS — I480 Paroxysmal atrial fibrillation: Secondary | ICD-10-CM

## 2021-02-19 LAB — POCT INR: INR: 2.5 (ref 2.0–3.0)

## 2021-02-19 NOTE — Patient Instructions (Signed)
Continue warfarin 1 tablet daily except 1/2 tablet Sundays, Tuesdays and Thursdays Continue greens/salads Recheck INR in 6 weeks 

## 2021-04-02 ENCOUNTER — Other Ambulatory Visit: Payer: Self-pay

## 2021-04-02 ENCOUNTER — Ambulatory Visit (INDEPENDENT_AMBULATORY_CARE_PROVIDER_SITE_OTHER): Payer: Medicare Other | Admitting: *Deleted

## 2021-04-02 DIAGNOSIS — Z5181 Encounter for therapeutic drug level monitoring: Secondary | ICD-10-CM

## 2021-04-02 DIAGNOSIS — I480 Paroxysmal atrial fibrillation: Secondary | ICD-10-CM

## 2021-04-02 LAB — POCT INR: INR: 2.3 (ref 2.0–3.0)

## 2021-04-02 NOTE — Patient Instructions (Signed)
Continue warfarin 1 tablet daily except 1/2 tablet Sundays, Tuesdays and Thursdays Continue greens/salads Recheck INR in 6 weeks 

## 2021-04-29 ENCOUNTER — Other Ambulatory Visit: Payer: Self-pay | Admitting: Family Medicine

## 2021-05-14 ENCOUNTER — Ambulatory Visit (INDEPENDENT_AMBULATORY_CARE_PROVIDER_SITE_OTHER): Payer: Medicare Other | Admitting: *Deleted

## 2021-05-14 DIAGNOSIS — Z5181 Encounter for therapeutic drug level monitoring: Secondary | ICD-10-CM | POA: Diagnosis not present

## 2021-05-14 DIAGNOSIS — I480 Paroxysmal atrial fibrillation: Secondary | ICD-10-CM

## 2021-05-14 LAB — POCT INR: INR: 2.1 (ref 2.0–3.0)

## 2021-05-14 NOTE — Patient Instructions (Signed)
Continue warfarin 1 tablet daily except 1/2 tablet Sundays, Tuesdays and Thursdays Continue greens/salads Recheck INR in 6 weeks 

## 2021-06-04 ENCOUNTER — Other Ambulatory Visit: Payer: Self-pay | Admitting: Family Medicine

## 2021-06-25 ENCOUNTER — Ambulatory Visit (INDEPENDENT_AMBULATORY_CARE_PROVIDER_SITE_OTHER): Payer: Medicare Other | Admitting: *Deleted

## 2021-06-25 DIAGNOSIS — Z5181 Encounter for therapeutic drug level monitoring: Secondary | ICD-10-CM | POA: Diagnosis not present

## 2021-06-25 DIAGNOSIS — I480 Paroxysmal atrial fibrillation: Secondary | ICD-10-CM

## 2021-06-25 LAB — POCT INR: INR: 1.9 — AB (ref 2.0–3.0)

## 2021-06-25 NOTE — Patient Instructions (Signed)
Description   - Take 1.5 tablets of warfarin today, then continue warfarin 1 tablet daily except 1/2 tablet Sundays, Tuesdays and Thursdays -Continue greens/salads -Recheck INR in 4 weeks

## 2021-07-13 NOTE — Patient Instructions (Addendum)
Health Maintenance Due  Topic Date Due   Zoster Vaccines- Shingrix (1 of 2)- Please let us know when you get your shingles shot in January.  Never done   COVID-19 Vaccine (5 - Booster for Moderna series)- Please schedule an appointment at your local pharmacy to get your bivalent booster shot. When received, please let us know.  01/27/2021   We will call you within two weeks about your referral to CT scan at [facility] . If you do not hear within 2-3 weeks, give [place] a call.   Please stop by lab before you go If you have mychart- we will send your results within 3 business days of [place] receiving them.  If you do not have mychart- we will call you about results within 5 business days of [place] receiving them.  *please also note that you will see labs on mychart as soon as they post. I will later go in and write notes on them- will say "notes from Dr. Durene Cal"  Recommended follow up: Return in about 1 year (around 07/20/2022) for physical or sooner if needed.  Happy Holidays!

## 2021-07-13 NOTE — Progress Notes (Signed)
Phone: [phone]   Subjective:  Patient presents today for their annual physical. Chief complaint-noted.   See problem oriented charting- ROS- full  review of systems was completed and negative  except for: mild runny nose during this season  - still walking 10 miles daily!   The following were reviewed and entered/updated in epic: Past Medical History:  Diagnosis Date   Atrial fibrillation ([place])    CAD (coronary artery disease)    BMS x 2 distal RCA 1998; DES x 2 mid to distal RCA 2005, had occluded distal circumflex with left to left collaterals as well   Cataract    Depression    Essential hypertension    Hyperlipidemia    Insomnia    Ischemic cardiomyopathy    Sinus congestion    Patient Active Problem List   Diagnosis Date Noted   Pulmonary nodule 11/12/2019    Priority: High   Atrial fibrillation ([place]) 09/29/2018    Priority: High   CAD (coronary artery disease) 02/17/2007    Priority: High   Binocular diplopia 12/31/2016    Priority: Medium    Hyperlipidemia 02/17/2007    Priority: Medium    Essential hypertension 02/17/2007    Priority: Medium    Headache 05/24/2014    Priority: Low   INSOMNIA, PERSISTENT 03/03/2007    Priority: Low   Depression 03/03/2007    Priority: Low   SYNDROME, CARPAL TUNNEL 02/17/2007    Priority: Low   Fatty liver 02/17/2007    Priority: Low   Secondary hypercoagulable state ([place]) 07/20/2021   Encounter for therapeutic drug monitoring 09/29/2018   Chronic sinusitis 07/10/2017   Right shoulder pain 09/02/2014   Past Surgical History:  Procedure Laterality Date   APPENDECTOMY     CATARACT EXTRACTION W/PHACO Right 07/08/2013   Procedure: CATARACT EXTRACTION PHACO AND INTRAOCULAR LENS PLACEMENT ([place]);  Surgeon: Tonny Branch, MD;  Location: [facility];  Service: Ophthalmology;  Laterality: Right;  CDE 26.34   CORONARY ANGIOPLASTY     stents x4   RIGHT/LEFT HEART CATH AND CORONARY ANGIOGRAPHY N/A 07/04/2020   Procedure:  RIGHT/LEFT HEART CATH AND CORONARY ANGIOGRAPHY;  Surgeon: Jolaine Artist, MD;  Location: [facility];  Service: Cardiovascular;  Laterality: N/A;   ROTATOR CUFF REPAIR Right    Undescended testicle surgery      Family History  Problem Relation Age of Onset   Diabetes Father    Stroke Father    Colon cancer Neg Hx    Rectal cancer Neg Hx    Stomach cancer Neg Hx     Medications- reviewed and updated Current Outpatient Medications  Medication Sig Dispense Refill   aspirin EC 81 MG tablet Take 81 mg by mouth daily.     atorvastatin (LIPITOR) 80 MG tablet Take 1 tablet by mouth once daily 90 tablet 0   digoxin (LANOXIN) 0.125 MG tablet Take 1 tablet (0.125 mg total) by mouth daily. 90 tablet 3   furosemide (LASIX) 20 MG tablet Take 1 tablet by mouth once daily 90 tablet 0   isosorbide mononitrate (IMDUR) 30 MG 24 hr tablet Take 1 tablet (30 mg total) by mouth daily. 90 tablet 3   loratadine (CLARITIN) 10 MG tablet Take 10 mg by mouth daily as needed for allergies.     metoprolol succinate (TOPROL-XL) 50 MG 24 hr tablet Take 1 tablet (50 mg total) by mouth daily with supper. 90 tablet 3   warfarin (COUMADIN) 5 MG tablet Take 1 tablet daily except  1/2 tablet on Sundays, Tuesdays and Thursdays or as directed 70 tablet 3   No current facility-administered medications for this visit.    Allergies-reviewed and updated No Known Allergies  Social History   Social History Narrative   Married 1964. 2 kids-son ([place]- in between businesses in 2021 looking at retring), daughter ([place]- had guillain barre syndrome). 5 grandkids.       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      Hobbies: walk 8 miles a day, neighborhood handyman   Objective  Objective:  BP 116/60   Pulse 68   Temp 98.7 F (37.1 C) (Oral)   Ht 5\' 8"  (1.727 m)   Wt 131 lb (59.4 kg)   SpO2 98%   BMI 19.92 kg/m  Gen: NAD, resting comfortably HEENT: Mucous membranes are moist. Oropharynx normal Neck: no thyromegaly CV:  Irregularly irregular, stable murmur Lungs: CTAB no crackles, wheeze, rhonchi Abdomen: soft/nontender/nondistended/normal bowel sounds. No rebound or guarding.  Ext: no edema Skin: warm, dry Neuro: grossly normal, moves all extremities, PERRLA    Assessment and Plan  80 y.o. male presenting for annual physical.  Health Maintenance counseling: 1. Anticipatory guidance: Patient counseled regarding regular dental exams - very few teeth- advised to see dentist, eye exams -advised yearly ,  avoiding smoking and second hand smoke, limiting alcohol to 2 beverages per day-2 or less per month and none in winter which is ideal with some fatty liver.  No illicit drugs 2. Risk factor reduction:  Advised patient of need for regular exercise and diet rich and fruits and vegetables to reduce risk of heart attack and stroke.   Exercise-  Still walking 10 miles a day. Diet-slight weight loss again- he prefers to be slim and eats less- he likes weight. Advised no further weight loss. If loses further weight consider unintentional weight loss workup but sounds intentional at this point Wt Readings from Last 3 Encounters:  07/20/21 131 lb (59.4 kg)  02/12/21 130 lb (59 kg)  08/01/20 142 lb (64.4 kg)  3. Immunizations/screenings/ancillary studies DISCUSSED:  -Shingrix vaccination #1 - scheduled in January  -COVID booster vaccination- recommend bivalent booster at pharmacy Immunization History  Administered Date(s) Administered   Influenza Whole 06/04/2007, 06/07/2009, 05/03/2010   Influenza, High Dose Seasonal PF 05/10/2013, 05/25/2016, 04/30/2018, 05/25/2019, 05/03/2020, 05/28/2021   Influenza-Unspecified 05/14/2014, 06/21/2015, 05/13/2017, 04/30/2018   Moderna Sars-Covid-2 Vaccination 08/28/2019, 09/27/2019, 06/02/2020, 12/02/2020   Pneumococcal Conjugate-13 06/21/2015   Pneumococcal Polysaccharide-23 06/04/2007, 05/28/2021   Tdap 06/11/2012   4. Prostate cancer screening- Past age based screening  recommendations. No change in urinary symptoms other than due to lasix   Lab Results  Component Value Date   PSA 0.58 06/03/2011   PSA 0.48 04/26/2010   PSA 0.51 05/26/2008   5. Colon cancer screening - 07/12/14-and was told no repeat due to age- had seen Dr. Deatra Ina. No blood in stool or melena 6. Skin cancer screening- did not see dermatology last year after referral- declined. advised regular sunscreen use. Denies worrisome, changing, or new skin lesions.  7. Smoking associated screening (lung cancer screening, AAA screen 65-75, UA)- Former smoker- 7.5 pack years quit in 1992. Prior hematuria workup with urology thought to be BPH related- will get urine microscopic and as long as not worsening monitor only  8. STD screening - only active with wife  Status of chronic or acute concerns   #Pulmonary nodule follow-up-patient was initially contacted about his CT back in April but he wanted this done in [place] and he was  never called back about this-we entered an order for anything today and asked him to reach out to [place] if he does not hear within 2 or 3 -week  # Atrial fibrillation S: Rate controlled with metoprolol 50 mg XR daily.  Also on digoxin 0.125 mg daily Anticoagulated with Coumadin with cardiology clinic -Last cardiology visit was 02/12/2021 with plan for 2-month follow-up -Secondary hypercoagulable state related to A. fib noted-on Coumadin as a result and stable A/P: appropriately anticoagulated and rate controlled   #hypertension S: medication: metoprolol 50Mg  XR daily, Lasix 20 mg daily, imdur 30 mg daily  BP Readings from Last 3 Encounters:  07/20/21 116/60  02/12/21 (!) 102/56  08/01/20 118/66  A/P:  Controlled. Continue current medications.     #CAD-compliant with aspirin.  Currently no chest pain or shortness of breath on Imdur 30 mg.  Right and left heart catheterization July 04, 2020-mild progression since 2005 but well collateralized.    #Cardiomyopathy-unfortunately EF had progressed to 20 to 25% in 2021-patient did feel better on digoxin as thought that some of reduced EF was related to uncontrolled A. fib.  Did not tolerate Entresto in the past even low-dose.  Had not been on Aldactone yet.  He does well with Lasix 20 mg daily and no signs of fluid overload, no weight gain, and still able to walk 10 miles a day!    #hyperlipidemia S: Medication: Atorvastatin 80Mg  daily  Lab Results  Component Value Date   CHOL 112 07/19/2020   HDL 30 (L) 07/19/2020   LDLCALC 66 07/19/2020   LDLDIRECT 60.0 07/10/2017   TRIG 79 07/19/2020   CHOLHDL 3.7 07/19/2020   A/P: Hopefully stable-LDL goal 70 or less with CAD-update lipid panel today  # Depression- no recent recurrence. 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 Stable overall   #Prediabetes/hyperglycemia-A1c had been elevated in the past. Continued healthy eating/regular exercise  Lab Results  Component Value Date   HGBA1C 6.1 (H) 07/19/2020   #Signs of fatty liver in the past-update LFTs  Recommended follow up: Return in about 1 year (around 07/20/2022) for physical or sooner if needed. Future Appointments  Date Time Provider [place]  07/23/2021  9:15 AM [facility]   Lab/Order associations: fasting   ICD-10-CM   1. Preventative health care  Z00.00     2. Essential hypertension  I10     3. Mixed hyperlipidemia  E78.2 CBC with Differential/Platelet    Comprehensive metabolic panel    Lipid panel    TSH    4. Longstanding persistent atrial fibrillation (HCC)  I48.11     5. Cardiomyopathy, unspecified type ([place])  I42.9     6. Coronary artery disease of native artery of native heart with stable angina pectoris ([place])  I25.118     7. Secondary hypercoagulable  state ([place])  D68.69     8. Hematuria, unspecified type  R31.9 Urine Microscopic    9. Abnormal findings on diagnostic imaging of lung  R91.8 CT Chest Wo Contrast    CANCELED: CT Chest Wo Contrast    10. Hyperglycemia  R73.9 HgB A1c     No orders of the defined types were placed in this encounter.  I,Jada Bradford,acting as a scribe for Garret Reddish, MD.,have documented all relevant documentation on the behalf of Garret Reddish, MD,as directed  by  Tana Conch, MD while in the presence of Tana Conch, MD.  I, Tana Conch, MD, have reviewed all documentation for this visit. The documentation on 07/20/21 for the exam, diagnosis, procedures, and orders are all accurate and complete.  Return precautions advised.  Tana Conch, MD

## 2021-07-20 ENCOUNTER — Encounter: Payer: Self-pay | Admitting: Family Medicine

## 2021-07-20 ENCOUNTER — Ambulatory Visit (INDEPENDENT_AMBULATORY_CARE_PROVIDER_SITE_OTHER): Payer: Medicare Other | Admitting: Family Medicine

## 2021-07-20 VITALS — BP 116/60 | HR 68 | Temp 98.7°F | Ht 68.0 in | Wt 131.0 lb

## 2021-07-20 DIAGNOSIS — E782 Mixed hyperlipidemia: Secondary | ICD-10-CM | POA: Diagnosis not present

## 2021-07-20 DIAGNOSIS — I10 Essential (primary) hypertension: Secondary | ICD-10-CM

## 2021-07-20 DIAGNOSIS — R739 Hyperglycemia, unspecified: Secondary | ICD-10-CM | POA: Diagnosis not present

## 2021-07-20 DIAGNOSIS — F32A Depression, unspecified: Secondary | ICD-10-CM

## 2021-07-20 DIAGNOSIS — I4811 Longstanding persistent atrial fibrillation: Secondary | ICD-10-CM | POA: Diagnosis not present

## 2021-07-20 DIAGNOSIS — D6869 Other thrombophilia: Secondary | ICD-10-CM

## 2021-07-20 DIAGNOSIS — Z0000 Encounter for general adult medical examination without abnormal findings: Secondary | ICD-10-CM | POA: Diagnosis not present

## 2021-07-20 DIAGNOSIS — R319 Hematuria, unspecified: Secondary | ICD-10-CM

## 2021-07-20 DIAGNOSIS — I429 Cardiomyopathy, unspecified: Secondary | ICD-10-CM

## 2021-07-20 DIAGNOSIS — R911 Solitary pulmonary nodule: Secondary | ICD-10-CM

## 2021-07-20 DIAGNOSIS — I25118 Atherosclerotic heart disease of native coronary artery with other forms of angina pectoris: Secondary | ICD-10-CM

## 2021-07-20 DIAGNOSIS — R918 Other nonspecific abnormal finding of lung field: Secondary | ICD-10-CM

## 2021-07-20 LAB — COMPREHENSIVE METABOLIC PANEL
ALT: 22 U/L (ref 0–53)
AST: 32 U/L (ref 0–37)
Albumin: 4 g/dL (ref 3.5–5.2)
Alkaline Phosphatase: 56 U/L (ref 39–117)
BUN: 20 mg/dL (ref 6–23)
CO2: 30 mEq/L (ref 19–32)
Calcium: 9.6 mg/dL (ref 8.4–10.5)
Chloride: 101 mEq/L (ref 96–112)
Creatinine, Ser: 1.05 mg/dL (ref 0.40–1.50)
GFR: 66.51 mL/min (ref >60.00)
Glucose, Bld: 94 mg/dL (ref 70–99)
Potassium: 4.8 mEq/L (ref 3.5–5.1)
Sodium: 138 mEq/L (ref 135–145)
Total Bilirubin: 1 mg/dL (ref 0.2–1.2)
Total Protein: 6.8 g/dL (ref 6.0–8.3)

## 2021-07-20 LAB — LIPID PANEL
Cholesterol: 117 mg/dL (ref 0–200)
HDL: 45 mg/dL (ref >39.00)
LDL Cholesterol: 60 mg/dL (ref 0–99)
NonHDL: 72.37
Total CHOL/HDL Ratio: 3
Triglycerides: 64 mg/dL (ref 0.0–149.0)
VLDL: 12.8 mg/dL (ref 0.0–40.0)

## 2021-07-20 LAB — CBC WITH DIFFERENTIAL/PLATELET
Basophils Absolute: 0.1 10*3/uL (ref 0.0–0.1)
Basophils Relative: 1.2 % (ref 0.0–3.0)
Eosinophils Absolute: 0.2 10*3/uL (ref 0.0–0.7)
Eosinophils Relative: 3.8 % (ref 0.0–5.0)
HCT: 40.8 % (ref 39.0–52.0)
Hemoglobin: 13.7 g/dL (ref 13.0–17.0)
Lymphocytes Relative: 17.4 % (ref 12.0–46.0)
Lymphs Abs: 0.8 10*3/uL (ref 0.7–4.0)
MCHC: 33.6 g/dL (ref 30.0–36.0)
MCV: 96.7 fl (ref 78.0–100.0)
Monocytes Absolute: 0.5 10*3/uL (ref 0.1–1.0)
Monocytes Relative: 10.5 % (ref 3.0–12.0)
Neutro Abs: 3.1 10*3/uL (ref 1.4–7.7)
Neutrophils Relative %: 67.1 % (ref 43.0–77.0)
Platelets: 201 10*3/uL (ref 150.0–400.0)
RBC: 4.22 Mil/uL (ref 4.22–5.81)
RDW: 13.2 % (ref 11.5–15.5)
WBC: 4.6 10*3/uL (ref 4.0–10.5)

## 2021-07-20 LAB — URINALYSIS, MICROSCOPIC ONLY

## 2021-07-20 LAB — TSH: TSH: 1.12 u[IU]/mL (ref 0.35–5.50)

## 2021-07-20 LAB — HEMOGLOBIN A1C: Hgb A1c MFr Bld: 6.1 % (ref 4.6–6.5)

## 2021-07-23 ENCOUNTER — Ambulatory Visit (INDEPENDENT_AMBULATORY_CARE_PROVIDER_SITE_OTHER): Payer: Medicare Other | Admitting: *Deleted

## 2021-07-23 DIAGNOSIS — I480 Paroxysmal atrial fibrillation: Secondary | ICD-10-CM

## 2021-07-23 DIAGNOSIS — Z5181 Encounter for therapeutic drug level monitoring: Secondary | ICD-10-CM

## 2021-07-23 LAB — POCT INR: INR: 1.9 — AB (ref 2.0–3.0)

## 2021-07-23 NOTE — Patient Instructions (Signed)
-   Take 1.5 tablets of warfarin today, then increase warfarin 1 tablet daily except 1/2 tablet Sundays and Thursdays -Continue greens/salads -Recheck INR in 4 weeks

## 2021-07-28 ENCOUNTER — Other Ambulatory Visit: Payer: Self-pay | Admitting: Family Medicine

## 2021-07-31 ENCOUNTER — Other Ambulatory Visit: Payer: Self-pay | Admitting: Family Medicine

## 2021-08-09 ENCOUNTER — Encounter: Payer: Self-pay | Admitting: Family Medicine

## 2021-08-13 ENCOUNTER — Other Ambulatory Visit: Payer: Self-pay | Admitting: *Deleted

## 2021-08-13 MED ORDER — METOPROLOL SUCCINATE ER 50 MG PO TB24: 50.0000 mg | ORAL_TABLET | Freq: Every day | ORAL | 0 refills | Status: DC

## 2021-08-20 ENCOUNTER — Ambulatory Visit (INDEPENDENT_AMBULATORY_CARE_PROVIDER_SITE_OTHER): Payer: Medicare Other | Admitting: *Deleted

## 2021-08-20 ENCOUNTER — Other Ambulatory Visit: Payer: Self-pay

## 2021-08-20 DIAGNOSIS — Z5181 Encounter for therapeutic drug level monitoring: Secondary | ICD-10-CM

## 2021-08-20 DIAGNOSIS — I480 Paroxysmal atrial fibrillation: Secondary | ICD-10-CM | POA: Diagnosis not present

## 2021-08-20 LAB — POCT INR: INR: 2.5 (ref 2.0–3.0)

## 2021-08-20 NOTE — Patient Instructions (Signed)
Continue warfarin 1 tablet daily except 1/2 tablet Sundays and Thursdays -Continue greens/salads -Recheck INR in 4 weeks

## 2021-09-03 ENCOUNTER — Other Ambulatory Visit: Payer: Self-pay | Admitting: Cardiology

## 2021-09-04 ENCOUNTER — Other Ambulatory Visit: Payer: Self-pay | Admitting: *Deleted

## 2021-09-04 MED ORDER — DIGOXIN 125 MCG PO TABS: 0.1250 mg | ORAL_TABLET | Freq: Every day | ORAL | 0 refills | Status: DC

## 2021-09-05 NOTE — Progress Notes (Signed)
Cardiology Office Note  Date: 09/06/2021   ID: Martin James, Martin James March 15, 1941, MRN [id number]  PCP:  [name], MD  Cardiologist:  Nona Dell, MD Electrophysiologist:  None   Chief Complaint  Patient presents with   Cardiac follow-up    History of Present Illness: Martin James is an 81 y.o. male last seen in July 2022 by Mr. Vincenza Hews NP.  He is here for a routine visit.  States that he has been doing very well, reports NYHA class I dyspnea, intermittent sense of palpitations, no angina.  He prides himself on a regular walking for exercise.  States that he walks a total of 10 miles each day, divided up in 3-hour sessions.  Enjoys walking in his neighborhood.  He is on Coumadin with follow-up in the anticoagulation clinic, recent INR 2.5.  He does not report any active bleeding problems.  I reviewed the remainder of his medications which are outlined below.  Echocardiogram from November 2021 revealed LVEF 20 to 25% with wall motion abnormalities consistent with ischemic cardiomyopathy, mild RV dysfunction, mild to moderate mitral regurgitation, and mild to moderate calcific aortic stenosis.  Cardiac catheterization at that time revealed two-vessel obstructive CAD with occluded mid RCA associated with good left-to-right collateral filling and high-grade circumflex stenosis associated with good left to left collateral filling.  Cardiomyopathy likely represents mixed picture, not completely explained by ischemic heart disease.  Per discussion with patient this has been managed medically.  He does run a relatively low blood pressure and is asymptomatic, although this limits medical therapy titration overall.   Past Medical History:  Diagnosis Date   Atrial fibrillation (HCC)    CAD (coronary artery disease)    BMS x 2 distal RCA 1998; DES x 2 mid to distal RCA 2005, had occluded distal circumflex with left to left collaterals as well   Cataract    Depression    Essential  hypertension    Hyperlipidemia    Insomnia    Ischemic cardiomyopathy    Sinus congestion     Past Surgical History:  Procedure Laterality Date   APPENDECTOMY     CATARACT EXTRACTION W/PHACO Right 07/08/2013   Procedure: CATARACT EXTRACTION PHACO AND INTRAOCULAR LENS PLACEMENT ([facility]);  Surgeon: Gemma Payor, MD;  Location: [facility];  Service: Ophthalmology;  Laterality: Right;  CDE 26.34   CORONARY ANGIOPLASTY     stents x4   RIGHT/LEFT HEART CATH AND CORONARY ANGIOGRAPHY N/A 07/04/2020   Procedure: RIGHT/LEFT HEART CATH AND CORONARY ANGIOGRAPHY;  Surgeon: Dolores Patty, MD;  [facility];  Service: Cardiovascular;  Laterality: N/A;   ROTATOR CUFF REPAIR Right    Undescended testicle surgery      Current Outpatient Medications  Medication Sig Dispense Refill   aspirin EC 81 MG tablet Take 81 mg by mouth daily.     atorvastatin (LIPITOR) 80 MG tablet Take 1 tablet by mouth once daily 90 tablet 0   digoxin (LANOXIN) 0.125 MG tablet Take 1 tablet (0.125 mg total) by mouth daily. 90 tablet 0   furosemide (LASIX) 20 MG tablet Take 1 tablet by mouth once daily 90 tablet 1   isosorbide mononitrate (IMDUR) 30 MG 24 hr tablet Take 1 tablet (30 mg total) by mouth daily. 90 tablet 3   loratadine (CLARITIN) 10 MG tablet Take 10 mg by mouth daily as needed for allergies.     metoprolol succinate (TOPROL-XL) 50 MG 24 hr tablet Take 1 tablet (50 mg  total) by mouth daily with supper. 90 tablet 0   warfarin (COUMADIN) 5 MG tablet Take 1 tablet daily except 1/2 tablet on Sundays, Tuesdays and Thursdays or as directed 70 tablet 3   No current facility-administered medications for this visit.   Allergies:  Patient has no known allergies.   ROS: No palpitations or syncope.  Physical Exam: VS:  BP 116/72    Pulse 65    Ht 5' 8.5" (1.74 m)    Wt 131 lb (59.4 kg)    SpO2 98%    BMI 19.63 kg/m , BMI Body mass index is 19.63 kg/m.  Wt Readings from Last 3 Encounters:  09/06/21 131 lb  (59.4 kg)  07/20/21 131 lb (59.4 kg)  02/12/21 130 lb (59 kg)    General: Patient appears comfortable at rest. HEENT: Conjunctiva and lids normal, wearing a mask. Neck: Supple, no elevated JVP or carotid bruits, no thyromegaly. Lungs: Clear to auscultation, nonlabored breathing at rest. Cardiac: Indistinct PMI, irregularly irregular, 2/6 systolic murmur.  No S3. Extremities: No pitting edema.  ECG:  An ECG dated 06/08/2020 was personally reviewed today and demonstrated:  Atrial fibrillation with repolarization abnormalities and intermittent PVCs versus aberrantly conducted complexes.  Recent Labwork: 07/20/2021: ALT 22; AST 32; BUN 20; Creatinine, Ser 1.05; Hemoglobin 13.7; Platelets 201.0; Potassium 4.8; Sodium 138; TSH 1.12     Component Value Date/Time   CHOL 117 07/20/2021 0838   TRIG 64.0 07/20/2021 0838   HDL 45.00 07/20/2021 0838   CHOLHDL 3 07/20/2021 0838   VLDL 12.8 07/20/2021 0838   LDLCALC 60 07/20/2021 0838   LDLCALC 66 07/19/2020 0835   LDLDIRECT 60.0 07/10/2017 0905    Other Studies Reviewed Today:  Echocardiogram 06/13/2020:  1. Left ventricular ejection fraction, by estimation, is 20 to 25%. The  left ventricle has severely decreased function. The left ventricle  demonstrates regional wall motion abnormalities (see scoring  diagram/findings for description). There is mild  asymmetric left ventricular hypertrophy of the septal segment. Left  ventricular diastolic parameters are indeterminate.   2. Right ventricular systolic function is mildly reduced. The right  ventricular size is normal. There is moderately elevated pulmonary artery  systolic pressure. The estimated right ventricular systolic pressure is  [id number] mmHg.   3. Left atrial size was severely dilated.   4. Right atrial size was moderately dilated.   5. The mitral valve is grossly normal. Mild to moderate mitral valve  regurgitation.   6. Tricuspid valve regurgitation is moderate.   7. The aortic  valve is tricuspid. There is moderate calcification of the  aortic valve. Aortic valve regurgitation is mild. Mild to moderate aortic  valve stenosis. Aortic valve mean gradient measures 11.5 mmHg. Aortic  valve Vmax measures 2.16 m/s.  Dimentionless index 0.43.   8. The inferior vena cava is normal in size with greater than 50%  respiratory variability, suggesting right atrial pressure of 3 mmHg.   Left and right heart catheterization 07/04/2020: Mid RCA to Dist RCA lesion is 100% stenosed. Mid Cx to Dist Cx lesion is 99% stenosed. Prox LAD to Mid LAD lesion is 40% stenosed. Prox Cx to Mid Cx lesion is 30% stenosed.   Findings:   Ao = 109/60 LV = 113/5 RA =  1 RV = 25/1 PA = 24/7 (14) PCW = 7 Fick cardiac output/index = 4.7/2.6 PVR = 1.5 WU FA sat = 99% PA sat = 71%, 76%   Assessment: 1.  2v CAD with total occlusion of mRCA  with good L -> R collaterals and high grade lesion in distal AV-groove LCX with L->L collaterals 2. EF 20% 3. Low filling pressures with normal cardiac output  Assessment and Plan:  1.  HFrEF with mixed cardiomyopathy in the setting of known ischemic heart disease.  LVEF approximately 20 to 25% by last assessment in November 2021.  Medical therapy being pursued per discussion with patient and he is clinically quite stable with NYHA class I dyspnea and good exercise tolerance.  Continue Toprol-XL, Lanoxin, and Lasix.  We will update his echocardiogram.  2.  Permanent atrial fibrillation with plan to continue heart rate control strategy.  He is on Lanoxin and Toprol-XL with good heart rate control.  Continue Coumadin (his preference) for stroke prophylaxis.  3.  Mild to moderate mitral regurgitation and mild to moderate calcific aortic stenosis, this will also be reevaluated by follow-up echocardiogram.  4.  CAD with occluded mid RCA associated with left-to-right collaterals and high-grade circumflex gnosis associated with left to left collaterals.  He does  not describe any active angina at this time.  Continue aspirin, Imdur, Toprol-XL, and Lipitor.  5.  Mixed hyperlipidemia, tolerating Lipitor with recent LDL 60.  Medication Adjustments/Labs and Tests Ordered: Current medicines are reviewed at length with the patient today.  Concerns regarding medicines are outlined above.   Tests Ordered: Orders Placed This Encounter  Procedures   EKG 12-Lead   ECHOCARDIOGRAM COMPLETE    Medication Changes: No orders of the defined types were placed in this encounter.   Disposition:  Follow up  6 months.  Signed, Satira Sark, MD, [facility] 09/06/2021 8:41 [address] [facility] [place], [place], [place] [address] Phone: [phone]; Fax: [phone]

## 2021-09-06 ENCOUNTER — Ambulatory Visit: Payer: Medicare Other | Admitting: Cardiology

## 2021-09-06 ENCOUNTER — Encounter: Payer: Self-pay | Admitting: Cardiology

## 2021-09-06 VITALS — BP 116/72 | HR 65 | Ht 68.5 in | Wt 131.0 lb

## 2021-09-06 DIAGNOSIS — E782 Mixed hyperlipidemia: Secondary | ICD-10-CM | POA: Diagnosis not present

## 2021-09-06 DIAGNOSIS — I4821 Permanent atrial fibrillation: Secondary | ICD-10-CM | POA: Diagnosis not present

## 2021-09-06 DIAGNOSIS — I5020 Unspecified systolic (congestive) heart failure: Secondary | ICD-10-CM

## 2021-09-06 DIAGNOSIS — I4819 Other persistent atrial fibrillation: Secondary | ICD-10-CM | POA: Diagnosis not present

## 2021-09-06 DIAGNOSIS — I255 Ischemic cardiomyopathy: Secondary | ICD-10-CM | POA: Diagnosis not present

## 2021-09-06 DIAGNOSIS — I25119 Atherosclerotic heart disease of native coronary artery with unspecified angina pectoris: Secondary | ICD-10-CM

## 2021-09-06 NOTE — Patient Instructions (Addendum)

## 2021-09-13 ENCOUNTER — Telehealth: Payer: Self-pay | Admitting: Family Medicine

## 2021-09-13 NOTE — Telephone Encounter (Signed)
Spoke with patient he stated he will call back

## 2021-09-25 ENCOUNTER — Ambulatory Visit: Payer: Medicare Other | Admitting: *Deleted

## 2021-09-25 DIAGNOSIS — I480 Paroxysmal atrial fibrillation: Secondary | ICD-10-CM | POA: Diagnosis not present

## 2021-09-25 DIAGNOSIS — Z5181 Encounter for therapeutic drug level monitoring: Secondary | ICD-10-CM

## 2021-09-25 LAB — POCT INR: INR: 1.9 — AB (ref 2.0–3.0)

## 2021-09-25 NOTE — Patient Instructions (Signed)
Take warfarin 1 1/2 tablets tonight then resume 1 tablet daily except 1/2 tablet Sundays and Thursdays -Continue greens/salads -Recheck INR in 4 weeks

## 2021-10-04 ENCOUNTER — Ambulatory Visit (INDEPENDENT_AMBULATORY_CARE_PROVIDER_SITE_OTHER): Payer: Medicare Other

## 2021-10-04 DIAGNOSIS — I255 Ischemic cardiomyopathy: Secondary | ICD-10-CM | POA: Diagnosis not present

## 2021-10-05 LAB — ECHOCARDIOGRAM COMPLETE
AR max vel: 0.98 cm2
AV Area VTI: 0.84 cm2
AV Area mean vel: 0.91 cm2
AV Mean grad: 17.8 mmHg
AV Peak grad: 29.6 mmHg
AV Vena cont: 0.67 cm
Ao pk vel: 2.72 m/s
Calc EF: 37.3 %
MV M vel: 4.25 m/s
MV Peak grad: 72.1 mmHg
P 1/2 time: 699 msec
Radius: 0.4 cm
S' Lateral: 5.33 cm
Single Plane A2C EF: 37.2 %
Single Plane A4C EF: 38.5 %

## 2021-10-23 ENCOUNTER — Ambulatory Visit: Payer: Medicare Other | Admitting: *Deleted

## 2021-10-23 DIAGNOSIS — I480 Paroxysmal atrial fibrillation: Secondary | ICD-10-CM

## 2021-10-23 DIAGNOSIS — Z5181 Encounter for therapeutic drug level monitoring: Secondary | ICD-10-CM

## 2021-10-23 LAB — POCT INR: INR: 2.1 (ref 2.0–3.0)

## 2021-10-23 NOTE — Patient Instructions (Signed)
Continue warfarin 1 tablet daily except 1/2 tablet Sundays and Thursdays Continue greens/salads -Recheck INR in 5 weeks 

## 2021-10-28 ENCOUNTER — Other Ambulatory Visit: Payer: Self-pay | Admitting: Family Medicine

## 2021-10-29 ENCOUNTER — Other Ambulatory Visit: Payer: Self-pay | Admitting: *Deleted

## 2021-10-29 MED ORDER — ISOSORBIDE MONONITRATE ER 30 MG PO TB24: 30.0000 mg | ORAL_TABLET | Freq: Every day | ORAL | 3 refills | Status: DC

## 2021-11-12 ENCOUNTER — Other Ambulatory Visit: Payer: Self-pay | Admitting: Cardiology

## 2021-11-27 ENCOUNTER — Ambulatory Visit: Payer: Medicare Other | Admitting: *Deleted

## 2021-11-27 DIAGNOSIS — I480 Paroxysmal atrial fibrillation: Secondary | ICD-10-CM | POA: Diagnosis not present

## 2021-11-27 DIAGNOSIS — Z5181 Encounter for therapeutic drug level monitoring: Secondary | ICD-10-CM

## 2021-11-27 LAB — POCT INR: INR: 3.2 — AB (ref 2.0–3.0)

## 2021-11-27 NOTE — Patient Instructions (Signed)
Take warfarin 1/2 tablet tonight then resume 1 tablet daily except 1/2 tablet Sundays and Thursdays ?Increase greens/salads ?-Recheck INR in 4 weeks ?

## 2021-12-01 ENCOUNTER — Other Ambulatory Visit: Payer: Self-pay | Admitting: Cardiology

## 2021-12-13 ENCOUNTER — Ambulatory Visit (HOSPITAL_COMMUNITY)
Admission: RE | Admit: 2021-12-13 | Discharge: 2021-12-13 | Disposition: A | Discharge status: Home / Self Care | Payer: Medicare Other | Source: Ambulatory Visit | Attending: Family Medicine | Admitting: Family Medicine

## 2021-12-13 DIAGNOSIS — R918 Other nonspecific abnormal finding of lung field: Secondary | ICD-10-CM | POA: Insufficient documentation

## 2021-12-13 DIAGNOSIS — J90 Pleural effusion, not elsewhere classified: Secondary | ICD-10-CM | POA: Diagnosis not present

## 2021-12-13 DIAGNOSIS — R911 Solitary pulmonary nodule: Secondary | ICD-10-CM | POA: Diagnosis not present

## 2021-12-17 ENCOUNTER — Encounter: Payer: Self-pay | Admitting: Family Medicine

## 2021-12-17 DIAGNOSIS — I700 Atherosclerosis of aorta: Secondary | ICD-10-CM | POA: Insufficient documentation

## 2021-12-25 ENCOUNTER — Ambulatory Visit: Payer: Medicare Other | Admitting: *Deleted

## 2021-12-25 DIAGNOSIS — I480 Paroxysmal atrial fibrillation: Secondary | ICD-10-CM | POA: Diagnosis not present

## 2021-12-25 DIAGNOSIS — Z5181 Encounter for therapeutic drug level monitoring: Secondary | ICD-10-CM | POA: Diagnosis not present

## 2021-12-25 LAB — POCT INR: INR: 2.5 (ref 2.0–3.0)

## 2021-12-25 NOTE — Patient Instructions (Signed)
Continue warfarin 1 tablet daily except 1/2 tablet Sundays and Thursdays Continue greens/salads -Recheck INR in 5 weeks

## 2022-01-17 ENCOUNTER — Other Ambulatory Visit: Payer: Self-pay | Admitting: Family Medicine

## 2022-01-29 ENCOUNTER — Ambulatory Visit (INDEPENDENT_AMBULATORY_CARE_PROVIDER_SITE_OTHER): Payer: Medicare Other | Admitting: *Deleted

## 2022-01-29 DIAGNOSIS — I480 Paroxysmal atrial fibrillation: Secondary | ICD-10-CM

## 2022-01-29 DIAGNOSIS — Z5181 Encounter for therapeutic drug level monitoring: Secondary | ICD-10-CM

## 2022-01-29 LAB — POCT INR: INR: 2.5 (ref 2.0–3.0)

## 2022-02-18 ENCOUNTER — Other Ambulatory Visit: Payer: Self-pay | Admitting: Cardiology

## 2022-03-04 ENCOUNTER — Telehealth: Payer: Self-pay | Admitting: Family Medicine

## 2022-03-04 NOTE — Telephone Encounter (Signed)
Copied from CRM [id number]. Topic: Medicare AWV >> Mar 04, 2022  9:53 AM Payton Doughty wrote: Reason for CRM: Left message for patient to schedule Annual Wellness Visit.  Please schedule with Nurse Health Advisor Lanier Ensign, RN at [facility]. This appt can be telephone or office visit. Please call [phone] ask for [facility]

## 2022-03-12 ENCOUNTER — Other Ambulatory Visit: Payer: Self-pay | Admitting: Cardiology

## 2022-03-12 NOTE — Progress Notes (Unsigned)
Cardiology Office Note  Date: 03/13/2022   ID: Martin James, Martin James 08-30-1940, MRN [id number]  PCP:  [name], MD  Cardiologist:  Nona Dell, MD Electrophysiologist:  None   Chief Complaint  Patient presents with   Cardiac follow-up    History of Present Illness: Martin James is an 81 y.o. male last seen in February.  He is here for a follow-up visit.  Reports NYHA class II dyspnea overall at this point.  Walking about 7-1/2 miles each day in divided sessions.  He does not describe any angina, has had some sinus fullness and intermittent wheezing.  Weight is up about 5 pounds compared to last visit.  He is on Coumadin with follow-up in anticoagulation clinic.  INR 3.0 today.  He denies any spontaneous bleeding problems.  Follow-up echocardiogram and March revealed LVEF 30 to 35% range, and moderate to severe low-flow aortic stenosis with mean gradient 18 mmHg and dimensionless index 0.27.  He has preferred conservative management on medical therapy from a cardiovascular perspective.  We went over his medications and discussed possibility of SGLT2 inhibitor with change Lasix to as needed use.  He did not tolerate Entresto previously.  Tends to run a low blood pressure at baseline.  Past Medical History:  Diagnosis Date   Atrial fibrillation (HCC)    CAD (coronary artery disease)    BMS x 2 distal RCA 1998; DES x 2 mid to distal RCA 2005, had occluded distal circumflex with left to left collaterals as well   Cataract    Depression    Essential hypertension    Hyperlipidemia    Insomnia    Ischemic cardiomyopathy    Sinus congestion     Past Surgical History:  Procedure Laterality Date   APPENDECTOMY     CATARACT EXTRACTION W/PHACO Right 07/08/2013   Procedure: CATARACT EXTRACTION PHACO AND INTRAOCULAR LENS PLACEMENT (IOC);  Surgeon: Gemma Payor, MD;  Location: [facility];  Service: Ophthalmology;  Laterality: Right;  CDE 26.34   CORONARY ANGIOPLASTY     stents x4    RIGHT/LEFT HEART CATH AND CORONARY ANGIOGRAPHY N/A 07/04/2020   Procedure: RIGHT/LEFT HEART CATH AND CORONARY ANGIOGRAPHY;  Surgeon: Dolores Patty, MD;  [facility]: [facility];  Service: Cardiovascular;  Laterality: N/A;   ROTATOR CUFF REPAIR Right    Undescended testicle surgery      Current Outpatient Medications  Medication Sig Dispense Refill   aspirin EC 81 MG tablet Take 81 mg by mouth daily.     atorvastatin (LIPITOR) 80 MG tablet Take 1 tablet by mouth once daily 90 tablet 0   digoxin (LANOXIN) 0.125 MG tablet TAKE 1 TABLET BY MOUTH ONCE DAILY **NEED  VISIT** 90 tablet 1   furosemide (LASIX) 20 MG tablet Take 1 tablet by mouth once daily 90 tablet 2   isosorbide mononitrate (IMDUR) 30 MG 24 hr tablet Take 1 tablet (30 mg total) by mouth daily. 90 tablet 3   loratadine (CLARITIN) 10 MG tablet Take 10 mg by mouth daily as needed for allergies.     metoprolol succinate (TOPROL-XL) 50 MG 24 hr tablet TAKE 1 TABLET BY MOUTH ONCE DAILY WITH SUPPER 90 tablet 3   warfarin (COUMADIN) 5 MG tablet TAKE ONE TABLET BY MOUTH DAILY EXCEPT ONE-HALF TABLET ON SUNDAY AND THURSDAY OR AS DIRECTED 75 tablet 1   No current facility-administered medications for this visit.   Allergies:  Patient has no known allergies.   ROS: No palpitations or  sudden syncope.  Physical Exam: VS:  BP 102/64   Pulse 91   Ht 5' 8.5" (1.74 m)   Wt 136 lb 12.8 oz (62.1 kg)   SpO2 98%   BMI 20.50 kg/m , BMI Body mass index is 20.5 kg/m.  Wt Readings from Last 3 Encounters:  03/13/22 136 lb 12.8 oz (62.1 kg)  09/06/21 131 lb (59.4 kg)  07/20/21 131 lb (59.4 kg)    General: Patient appears comfortable at rest. HEENT: Conjunctiva and lids normal, oropharynx clear. Neck: Supple, no elevated JVP or carotid bruits, no thyromegaly. Lungs: Clear to auscultation, nonlabored breathing at rest. Cardiac: Irregularly irregular, no S3, 2/6 systolic murmur, no pericardial rub. Extremities: No pitting  edema.  ECG:  An ECG dated 09/06/2021 was personally reviewed today and demonstrated:  Atrial fibrillation with PVCs, nonspecific ST-T changes, possible old anteroseptal infarct pattern.  Recent Labwork: 07/20/2021: ALT 22; AST 32; BUN 20; Creatinine, Ser 1.05; Hemoglobin 13.7; Platelets 201.0; Potassium 4.8; Sodium 138; TSH 1.12     Component Value Date/Time   CHOL 117 07/20/2021 0838   TRIG 64.0 07/20/2021 0838   HDL 45.00 07/20/2021 0838   CHOLHDL 3 07/20/2021 0838   VLDL 12.8 07/20/2021 0838   LDLCALC 60 07/20/2021 0838   LDLCALC 66 07/19/2020 0835   LDLDIRECT 60.0 07/10/2017 0905    Other Studies Reviewed Today:  Echocardiogram 10/04/2021:  1. Left ventricular ejection fraction, by estimation, is 30 to 35%. The  left ventricle has moderately decreased function. The left ventricle  demonstrates global hypokinesis. Left ventricular diastolic parameters are  indeterminate. The average left  ventricular global longitudinal strain is -11.5 %. The global longitudinal  strain is abnormal.   2. Right ventricular systolic function is normal. The right ventricular  size is normal.   3. Left atrial size was severely dilated.   4. Right atrial size was mildly dilated.   5. The mitral valve is abnormal. Mild mitral valve regurgitation. No  evidence of mitral stenosis.   6. The tricuspid valve is abnormal.   7. Low flow low gradient moderate to severe aortic stenosis. Mean grad  18, AVA VTI 0.84, DI 0.27.Marland Kitchen The aortic valve has an indeterminant number  of cusps. There is moderate calcification of the aortic valve. There is  moderate thickening of the aortic  valve. Aortic valve regurgitation is mild to moderate.   8. The inferior vena cava is normal in size with greater than 50%  respiratory variability, suggesting right atrial pressure of 3 mmHg.   Assessment and Plan:  1.  HFrEF with mixed cardiomyopathy in the setting of ischemic heart disease and aortic stenosis.  LVEF 30 to 35% by  assessment in March.  Presently with NYHA class II dyspnea and prefers conservative management overall.  We discussed addition of SGLT2 inhibitor and will check on cost and coverage for either Jardiance 10 mg daily or Farxiga 10 mg daily.  If he agrees with start, would then reduce Lasix to as needed use.  Otherwise continue Toprol-XL and Lanoxin.  He did not tolerate Entresto previously and tends to run a low blood pressure at baseline.  2.  Permanent atrial fibrillation with CHA2DS2-VASc score of 4.  He is on Coumadin with follow-up in the anticoagulation clinic.  Heart rate adequately controlled on combination of Toprol-XL and Lanoxin.  3.  Moderate to severe, low-flow low gradient aortic stenosis.  He has preferred conservative management.  4.  CAD with occluded mid RCA associated with left-to-right collaterals and high-grade  circumflex disease associated with left to left collaterals.  Continuing aspirin, Imdur, Toprol-XL, and Lipitor.  He does not report any progressive angina.  Medication Adjustments/Labs and Tests Ordered: Current medicines are reviewed at length with the patient today.  Concerns regarding medicines are outlined above.   Tests Ordered: No orders of the defined types were placed in this encounter.   Medication Changes: No orders of the defined types were placed in this encounter.   Disposition:  Follow up  6 months.  Signed, Jonelle Sidle, MD, [facility] 03/13/2022 8:45 AM    [facility] [address] [place], [place], [place] [address] Phone: [phone]; Fax: [phone]

## 2022-03-12 NOTE — Telephone Encounter (Signed)
Request for warfarin refill:  Last INR was 2.5 on 01/29/22 Next INR due on 03/13/22 LOV was 10/04/21  Dominga Ferry MD  Refill approved.

## 2022-03-13 ENCOUNTER — Telehealth: Payer: Self-pay

## 2022-03-13 ENCOUNTER — Encounter: Payer: Self-pay | Admitting: Cardiology

## 2022-03-13 ENCOUNTER — Ambulatory Visit: Payer: Medicare Other | Admitting: Cardiology

## 2022-03-13 ENCOUNTER — Ambulatory Visit (INDEPENDENT_AMBULATORY_CARE_PROVIDER_SITE_OTHER): Payer: Medicare Other | Admitting: *Deleted

## 2022-03-13 VITALS — BP 102/64 | HR 91 | Ht 68.5 in | Wt 136.8 lb

## 2022-03-13 DIAGNOSIS — I4821 Permanent atrial fibrillation: Secondary | ICD-10-CM | POA: Diagnosis not present

## 2022-03-13 DIAGNOSIS — I5020 Unspecified systolic (congestive) heart failure: Secondary | ICD-10-CM | POA: Diagnosis not present

## 2022-03-13 DIAGNOSIS — Z5181 Encounter for therapeutic drug level monitoring: Secondary | ICD-10-CM

## 2022-03-13 DIAGNOSIS — I25119 Atherosclerotic heart disease of native coronary artery with unspecified angina pectoris: Secondary | ICD-10-CM

## 2022-03-13 DIAGNOSIS — I350 Nonrheumatic aortic (valve) stenosis: Secondary | ICD-10-CM | POA: Diagnosis not present

## 2022-03-13 DIAGNOSIS — I480 Paroxysmal atrial fibrillation: Secondary | ICD-10-CM | POA: Diagnosis not present

## 2022-03-13 LAB — POCT INR: INR: 3 (ref 2.0–3.0)

## 2022-03-13 NOTE — Patient Instructions (Signed)

## 2022-03-13 NOTE — Telephone Encounter (Signed)
Informed patient that per pharmD, both jardiance and farxiga are covered and his cost would be $47/month. States that he would like to keep things the way that they are until him and his wife decide if this is something that he wants to do. States that he will reach back out to Dr. Diona Browner with a decision.

## 2022-03-13 NOTE — Patient Instructions (Signed)
Continue warfarin 1 tablet daily except 1/2 tablet Sundays and Thursdays Continue greens/salads -Recheck INR in 6 weeks

## 2022-03-13 NOTE — Telephone Encounter (Signed)
-----   Message from Rosalee Kaufman, RPH-CPP sent at 03/13/2022  9:07 AM EDT ----- Both are preferred products on his plan.  $47/month  Belenda Cruise ----- Message ----- From: Delsa Sale, CMA Sent: 03/13/2022   8:37 AM EDT To: [facility] Pharmd  Could you please tell me if the patients insurance will cover either jardiance 10 mg daily or farxiga 10 mg daily?  Thank you,  Charlcie Cradle, CMA

## 2022-03-22 ENCOUNTER — Ambulatory Visit (INDEPENDENT_AMBULATORY_CARE_PROVIDER_SITE_OTHER): Payer: Medicare Other | Admitting: Family

## 2022-03-22 ENCOUNTER — Encounter: Payer: Self-pay | Admitting: Family

## 2022-03-22 VITALS — BP 108/72 | HR 90 | Temp 97.7°F | Ht 68.5 in | Wt 135.5 lb

## 2022-03-22 DIAGNOSIS — F5102 Adjustment insomnia: Secondary | ICD-10-CM | POA: Diagnosis not present

## 2022-03-22 DIAGNOSIS — M25512 Pain in left shoulder: Secondary | ICD-10-CM

## 2022-03-22 DIAGNOSIS — J349 Unspecified disorder of nose and nasal sinuses: Secondary | ICD-10-CM

## 2022-03-22 MED ORDER — HYDROXYZINE HCL 10 MG PO TABS: 10.0000 mg | ORAL_TABLET | Freq: Every evening | ORAL | 1 refills | Status: DC

## 2022-03-22 NOTE — Progress Notes (Signed)
Patient ID: Martin James, male    DOB: 04/04/1941, 81 y.o.   MRN: [id number]  Chief Complaint  Patient presents with  . Shoulder Pain    Pt c/o pain/soreness in right upper shoulder, Pt states this started 3 weeks ago and has been getting worse. Has tried tylenol 500 mg 2/3 at a time, does help with the pain sometimes.   . Insomnia    Pt c/o not being able to sleep since beginning of this month. When came back from vacation. Pt states he feels very tired but not able to sleep. Pt states when he tried to sleep he getting an anxious feeling and has to get up and move around.   . Sinus Problem    On going sinus problem for about . Pt states his right ear feels full, Nasal drainage(Clear to green) Has had Z packs in the past  which he not sure if it helped or not he still feels the same.ENT referral     HPI: Left shoulder pain:    INSOMNIA:  How long: several weeks. Difficulty initiating sleep: yes.  Difficulty maintaining sleep: yes, mostly due to restlessness, urinates 1-2x, denies pain.  OTC meds tried: Melatonin. RX meds in past: Ambien in past, but didn't like it. Sleep hygiene measures: ***. Started any new meds recently: ***. Shift worker: ***. New stressors: ***.  Assessment & Plan:     Subjective:    Outpatient Medications Prior to Visit  Medication Sig Dispense Refill  . aspirin EC 81 MG tablet Take 81 mg by mouth daily.    Marland Kitchen atorvastatin (LIPITOR) 80 MG tablet Take 1 tablet by mouth once daily 90 tablet 0  . digoxin (LANOXIN) 0.125 MG tablet TAKE 1 TABLET BY MOUTH ONCE DAILY **NEED  VISIT** 90 tablet 1  . furosemide (LASIX) 20 MG tablet Take 1 tablet by mouth once daily 90 tablet 2  . isosorbide mononitrate (IMDUR) 30 MG 24 hr tablet Take 1 tablet (30 mg total) by mouth daily. 90 tablet 3  . loratadine (CLARITIN) 10 MG tablet Take 10 mg by mouth daily as needed for allergies.    . metoprolol succinate (TOPROL-XL) 50 MG 24 hr tablet TAKE 1 TABLET BY MOUTH ONCE DAILY WITH SUPPER  90 tablet 3  . warfarin (COUMADIN) 5 MG tablet TAKE ONE TABLET BY MOUTH DAILY EXCEPT ONE-HALF TABLET ON SUNDAY AND THURSDAY OR AS DIRECTED 75 tablet 1   No facility-administered medications prior to visit.   Past Medical History:  Diagnosis Date  . Atrial fibrillation (HCC)   . CAD (coronary artery disease)    BMS x 2 distal RCA 1998; DES x 2 mid to distal RCA 2005, had occluded distal circumflex with left to left collaterals as well  . Cataract   . Depression   . Essential hypertension   . Hyperlipidemia   . Insomnia   . Ischemic cardiomyopathy   . Sinus congestion    Past Surgical History:  Procedure Laterality Date  . APPENDECTOMY    . CATARACT EXTRACTION W/PHACO Right 07/08/2013   Procedure: CATARACT EXTRACTION PHACO AND INTRAOCULAR LENS PLACEMENT (IOC);  Surgeon: Gemma Payor, MD;  Location: [facility];  Service: Ophthalmology;  Laterality: Right;  CDE 26.34  . CORONARY ANGIOPLASTY     stents x4  . RIGHT/LEFT HEART CATH AND CORONARY ANGIOGRAPHY N/A 07/04/2020   Procedure: RIGHT/LEFT HEART CATH AND CORONARY ANGIOGRAPHY;  Surgeon: Dolores Patty, MD;  Location: [facility];  Service: Cardiovascular;  Laterality:  N/A;  . ROTATOR CUFF REPAIR Right   . Undescended testicle surgery     No Known Allergies    Objective:    Physical Exam Vitals and nursing note reviewed.  Constitutional:      General: He is not in acute distress.    Appearance: Normal appearance.  HENT:     Head: Normocephalic.  Cardiovascular:     Rate and Rhythm: Normal rate and regular rhythm.  Pulmonary:     Effort: Pulmonary effort is normal.     Breath sounds: Normal breath sounds.  Musculoskeletal:        General: Normal range of motion.     Cervical back: Normal range of motion.  Skin:    General: Skin is warm and dry.  Neurological:     Mental Status: He is alert and oriented to person, place, and time.  Psychiatric:        Mood and Affect: Mood normal.   BP 108/72 (BP Location: Left  Arm, Patient Position: Sitting, Cuff Size: Large)   Pulse 90   Temp 97.7 F (36.5 C) (Temporal)   Ht 5' 8.5" (1.74 m)   Wt 135 lb 8 oz (61.5 kg)   SpO2 92%   BMI 20.30 kg/m  Wt Readings from Last 3 Encounters:  03/22/22 135 lb 8 oz (61.5 kg)  03/13/22 136 lb 12.8 oz (62.1 kg)  09/06/21 131 lb (59.4 kg)       Dulce Sellar, NP

## 2022-03-22 NOTE — Patient Instructions (Signed)
It was very nice to see you today!   I have sent over the Hydroxyzine to take in the evenings to help prepare you for sleep, start with 1 pill, if not effective, increase to 2 pills.  Continue using the saline nasal spray up to 3 times per day to help flush your sinuses and disinfect. Try 1/2 pill of your generic Claritin (Loratadine) if you are having too much drying out in your nose or mouth.  Continue to take Tylenol as needed for your Shoulder, can also try analgesic creams or patches as needed - I will research why Diclofenac sodium (voltaren) gel not recommended for the shoulder and will let you know, but see no reason why not to try and see if helpful.   PLEASE NOTE:  If you had any lab tests please let us know if you have not heard back within a few days. You may see your results on MyChart before we have a chance to review them but we will give you a call once they are reviewed by [place]. If we ordered any referrals today, please let us know if you have not heard from their office within the next week.

## 2022-03-29 ENCOUNTER — Observation Stay (HOSPITAL_COMMUNITY)
Admission: EM | Admit: 2022-03-29 | Discharge: 2022-03-31 | Disposition: A | Discharge status: Home / Self Care | Payer: Medicare Other | Attending: Family Medicine | Admitting: Family Medicine

## 2022-03-29 ENCOUNTER — Emergency Department (HOSPITAL_COMMUNITY): Payer: Medicare Other

## 2022-03-29 ENCOUNTER — Telehealth: Payer: Self-pay | Admitting: Family Medicine

## 2022-03-29 ENCOUNTER — Encounter (HOSPITAL_COMMUNITY): Payer: Self-pay | Admitting: Emergency Medicine

## 2022-03-29 ENCOUNTER — Other Ambulatory Visit: Payer: Self-pay

## 2022-03-29 DIAGNOSIS — Z7901 Long term (current) use of anticoagulants: Secondary | ICD-10-CM | POA: Diagnosis not present

## 2022-03-29 DIAGNOSIS — Z7982 Long term (current) use of aspirin: Secondary | ICD-10-CM | POA: Diagnosis not present

## 2022-03-29 DIAGNOSIS — Z79899 Other long term (current) drug therapy: Secondary | ICD-10-CM | POA: Insufficient documentation

## 2022-03-29 DIAGNOSIS — I4821 Permanent atrial fibrillation: Secondary | ICD-10-CM | POA: Diagnosis not present

## 2022-03-29 DIAGNOSIS — R0602 Shortness of breath: Secondary | ICD-10-CM | POA: Diagnosis not present

## 2022-03-29 DIAGNOSIS — I350 Nonrheumatic aortic (valve) stenosis: Secondary | ICD-10-CM

## 2022-03-29 DIAGNOSIS — R600 Localized edema: Secondary | ICD-10-CM | POA: Diagnosis present

## 2022-03-29 DIAGNOSIS — D6869 Other thrombophilia: Secondary | ICD-10-CM | POA: Diagnosis not present

## 2022-03-29 DIAGNOSIS — J90 Pleural effusion, not elsewhere classified: Secondary | ICD-10-CM | POA: Diagnosis not present

## 2022-03-29 DIAGNOSIS — I509 Heart failure, unspecified: Principal | ICD-10-CM

## 2022-03-29 DIAGNOSIS — I10 Essential (primary) hypertension: Secondary | ICD-10-CM | POA: Diagnosis present

## 2022-03-29 DIAGNOSIS — I5023 Acute on chronic systolic (congestive) heart failure: Principal | ICD-10-CM | POA: Diagnosis present

## 2022-03-29 DIAGNOSIS — I110 Hypertensive heart disease with heart failure: Secondary | ICD-10-CM | POA: Diagnosis not present

## 2022-03-29 DIAGNOSIS — J811 Chronic pulmonary edema: Secondary | ICD-10-CM | POA: Diagnosis not present

## 2022-03-29 DIAGNOSIS — I4891 Unspecified atrial fibrillation: Secondary | ICD-10-CM | POA: Diagnosis not present

## 2022-03-29 LAB — CBC WITH DIFFERENTIAL/PLATELET
Abs Immature Granulocytes: 0.02 10*3/uL (ref 0.00–0.07)
Basophils Absolute: 0.1 10*3/uL (ref 0.0–0.1)
Basophils Relative: 2 %
Eosinophils Absolute: 0.4 10*3/uL (ref 0.0–0.5)
Eosinophils Relative: 7 %
HCT: 39.7 % (ref 39.0–52.0)
Hemoglobin: 12.9 g/dL — ABNORMAL LOW (ref 13.0–17.0)
Immature Granulocytes: 0 %
Lymphocytes Relative: 16 %
Lymphs Abs: 0.8 10*3/uL (ref 0.7–4.0)
MCH: 32.1 pg (ref 26.0–34.0)
MCHC: 32.5 g/dL (ref 30.0–36.0)
MCV: 98.8 fL (ref 80.0–100.0)
Monocytes Absolute: 0.5 10*3/uL (ref 0.1–1.0)
Monocytes Relative: 10 %
Neutro Abs: 3.4 10*3/uL (ref 1.7–7.7)
Neutrophils Relative %: 65 %
Platelets: 154 10*3/uL (ref 150–400)
RBC: 4.02 MIL/uL — ABNORMAL LOW (ref 4.22–5.81)
RDW: 13.7 % (ref 11.5–15.5)
WBC: 5.2 10*3/uL (ref 4.0–10.5)
nRBC: 0 % (ref 0.0–0.2)

## 2022-03-29 LAB — COMPREHENSIVE METABOLIC PANEL
ALT: 22 U/L (ref 0–44)
AST: 32 U/L (ref 15–41)
Albumin: 3.8 g/dL (ref 3.5–5.0)
Alkaline Phosphatase: 47 U/L (ref 38–126)
Anion gap: 8 (ref 5–15)
BUN: 19 mg/dL (ref 8–23)
CO2: 27 mmol/L (ref 22–32)
Calcium: 8.9 mg/dL (ref 8.9–10.3)
Chloride: 99 mmol/L (ref 98–111)
Creatinine, Ser: 1.02 mg/dL (ref 0.61–1.24)
GFR, Estimated: >60 mL/min (ref >60)
Glucose, Bld: 110 mg/dL — ABNORMAL HIGH (ref 70–99)
Potassium: 4.1 mmol/L (ref 3.5–5.1)
Sodium: 134 mmol/L — ABNORMAL LOW (ref 135–145)
Total Bilirubin: 1.2 mg/dL (ref 0.3–1.2)
Total Protein: 6.9 g/dL (ref 6.5–8.1)

## 2022-03-29 LAB — CBC
HCT: 39.5 % (ref 39.0–52.0)
Hemoglobin: 13.1 g/dL (ref 13.0–17.0)
MCH: 32.1 pg (ref 26.0–34.0)
MCHC: 33.2 g/dL (ref 30.0–36.0)
MCV: 96.8 fL (ref 80.0–100.0)
Platelets: 171 10*3/uL (ref 150–400)
RBC: 4.08 MIL/uL — ABNORMAL LOW (ref 4.22–5.81)
RDW: 13.9 % (ref 11.5–15.5)
WBC: 5.3 10*3/uL (ref 4.0–10.5)
nRBC: 0 % (ref 0.0–0.2)

## 2022-03-29 LAB — PROTIME-INR
INR: 3 — ABNORMAL HIGH (ref 0.8–1.2)
Prothrombin Time: 31.1 seconds — ABNORMAL HIGH (ref 11.4–15.2)

## 2022-03-29 LAB — BRAIN NATRIURETIC PEPTIDE: B Natriuretic Peptide: 1057 pg/mL — ABNORMAL HIGH (ref 0.0–100.0)

## 2022-03-29 LAB — APTT: aPTT: 46 seconds — ABNORMAL HIGH (ref 24–36)

## 2022-03-29 MED ORDER — HYDROXYZINE HCL 10 MG PO TABS
10.0000 mg | ORAL_TABLET | Freq: Every evening | ORAL | Status: DC
  Administered 2022-03-29 – 2022-03-30 (×2): 10 mg via ORAL
  Filled 2022-03-29 (×2): qty 1

## 2022-03-29 MED ORDER — ACETAMINOPHEN 650 MG RE SUPP: 650.0000 mg | Freq: Four times a day (QID) | RECTAL | Status: DC | PRN

## 2022-03-29 MED ORDER — BISACODYL 10 MG RE SUPP: 10.0000 mg | Freq: Every day | RECTAL | Status: DC | PRN

## 2022-03-29 MED ORDER — ASPIRIN 81 MG PO TBEC
81.0000 mg | DELAYED_RELEASE_TABLET | Freq: Every day | ORAL | Status: DC
  Administered 2022-03-30 – 2022-03-31 (×2): 81 mg via ORAL
  Filled 2022-03-29 (×2): qty 1

## 2022-03-29 MED ORDER — WARFARIN SODIUM 5 MG PO TABS
5.0000 mg | ORAL_TABLET | Freq: Once | ORAL | Status: AC
  Administered 2022-03-29: 5 mg via ORAL
  Filled 2022-03-29: qty 1

## 2022-03-29 MED ORDER — ONDANSETRON HCL 4 MG PO TABS: 4.0000 mg | ORAL_TABLET | Freq: Four times a day (QID) | ORAL | Status: DC | PRN

## 2022-03-29 MED ORDER — SODIUM CHLORIDE 0.9 % IV SOLN: INTRAVENOUS | Status: DC | PRN

## 2022-03-29 MED ORDER — DIGOXIN 125 MCG PO TABS
0.1250 mg | ORAL_TABLET | Freq: Every day | ORAL | Status: DC
  Administered 2022-03-30 – 2022-03-31 (×2): 0.125 mg via ORAL
  Filled 2022-03-29 (×2): qty 1

## 2022-03-29 MED ORDER — FUROSEMIDE 10 MG/ML IJ SOLN
20.0000 mg | Freq: Two times a day (BID) | INTRAMUSCULAR | Status: DC
  Administered 2022-03-29: 20 mg via INTRAVENOUS
  Filled 2022-03-29: qty 2

## 2022-03-29 MED ORDER — WARFARIN - PHARMACIST DOSING INPATIENT: Freq: Every day | Status: DC

## 2022-03-29 MED ORDER — SODIUM CHLORIDE 0.9% FLUSH
3.0000 mL | Freq: Two times a day (BID) | INTRAVENOUS | Status: DC
  Administered 2022-03-30 – 2022-03-31 (×3): 3 mL via INTRAVENOUS

## 2022-03-29 MED ORDER — TRAZODONE HCL 50 MG PO TABS
50.0000 mg | ORAL_TABLET | Freq: Every evening | ORAL | Status: DC | PRN
  Administered 2022-03-30: 50 mg via ORAL
  Filled 2022-03-29: qty 1

## 2022-03-29 MED ORDER — ATORVASTATIN CALCIUM 40 MG PO TABS
80.0000 mg | ORAL_TABLET | Freq: Every day | ORAL | Status: DC
  Administered 2022-03-29 – 2022-03-31 (×3): 80 mg via ORAL
  Filled 2022-03-29 (×4): qty 2

## 2022-03-29 MED ORDER — FUROSEMIDE 10 MG/ML IJ SOLN
40.0000 mg | Freq: Once | INTRAMUSCULAR | Status: AC
  Administered 2022-03-29: 40 mg via INTRAVENOUS
  Filled 2022-03-29: qty 4

## 2022-03-29 MED ORDER — SODIUM CHLORIDE 0.9% FLUSH: 3.0000 mL | INTRAVENOUS | Status: DC | PRN

## 2022-03-29 MED ORDER — POLYETHYLENE GLYCOL 3350 17 G PO PACK: 17.0000 g | PACK | Freq: Every day | ORAL | Status: DC | PRN

## 2022-03-29 MED ORDER — METOPROLOL SUCCINATE ER 25 MG PO TB24
25.0000 mg | ORAL_TABLET | Freq: Every day | ORAL | Status: DC
  Filled 2022-03-29: qty 1

## 2022-03-29 MED ORDER — ACETAMINOPHEN 325 MG PO TABS
650.0000 mg | ORAL_TABLET | Freq: Four times a day (QID) | ORAL | Status: DC | PRN
  Administered 2022-03-30 (×2): 650 mg via ORAL
  Filled 2022-03-29 (×2): qty 2

## 2022-03-29 MED ORDER — ONDANSETRON HCL 4 MG/2ML IJ SOLN: 4.0000 mg | Freq: Four times a day (QID) | INTRAMUSCULAR | Status: DC | PRN

## 2022-03-29 MED ORDER — FUROSEMIDE 10 MG/ML IJ SOLN
20.0000 mg | Freq: Two times a day (BID) | INTRAMUSCULAR | Status: DC
Start: 2022-03-30 — End: 2022-03-30
  Administered 2022-03-30: 20 mg via INTRAVENOUS
  Filled 2022-03-29: qty 2

## 2022-03-29 MED ORDER — LORATADINE 10 MG PO TABS
10.0000 mg | ORAL_TABLET | Freq: Every day | ORAL | Status: DC | PRN
  Administered 2022-03-29 – 2022-03-30 (×2): 10 mg via ORAL
  Filled 2022-03-29 (×2): qty 1

## 2022-03-29 MED ORDER — SODIUM CHLORIDE 0.9% FLUSH
3.0000 mL | Freq: Two times a day (BID) | INTRAVENOUS | Status: DC
  Administered 2022-03-29 – 2022-03-30 (×2): 3 mL via INTRAVENOUS

## 2022-03-29 NOTE — Telephone Encounter (Signed)
Glad he is getting ASAP care

## 2022-03-29 NOTE — ED Provider Notes (Signed)
[facility] EMERGENCY DEPARTMENT Provider Note   CSN: [id number] Arrival date & time: 03/29/22  1000     History Chief Complaint  Patient presents with   Leg Swelling    Martin James is a 81 y.o. male with h/o COPD, afib, CHF, CAD presents to the ED for evaluation of orthopnea for the past two weeks and lower leg edema today. The patient reports that he saw his NP about the orthopnea and "restlessness" he feels whenever he lays down and was given hydroxyzine which he reports minimal relief from. He reports that he has never had a problem with lower extremity edema and is compliant with all of his medications.  Denies any chest pain.  Denies any fever or productive cough.  Denies any nausea, vomiting, or lightheadedness.  HPI     Home Medications Prior to Admission medications   Medication Sig Start Date End Date Taking? Authorizing Provider  aspirin EC 81 MG tablet Take 81 mg by mouth daily.   Yes [provider]  atorvastatin (LIPITOR) 80 MG tablet Take 1 tablet by mouth once daily 01/17/22  Yes [name], MD  digoxin (LANOXIN) 0.125 MG tablet TAKE 1 TABLET BY MOUTH ONCE DAILY **NEED  VISIT** 12/03/21  Yes Jonelle Sidle, MD  furosemide (LASIX) 20 MG tablet Take 1 tablet by mouth once daily 02/18/22  Yes Jonelle Sidle, MD  hydrOXYzine (ATARAX) 10 MG tablet Take 1 tablet (10 mg total) by mouth every evening. For sedation, ok to take 2 tabs if needed to help fall asleep. 03/22/22  Yes Dulce Sellar, NP  isosorbide mononitrate (IMDUR) 30 MG 24 hr tablet Take 1 tablet (30 mg total) by mouth daily. 10/29/21  Yes Jonelle Sidle, MD  loratadine (CLARITIN) 10 MG tablet Take 10 mg by mouth daily as needed for allergies.   Yes [provider]  metoprolol succinate (TOPROL-XL) 50 MG 24 hr tablet TAKE 1 TABLET BY MOUTH ONCE DAILY WITH SUPPER 11/12/21  Yes Jonelle Sidle, MD  warfarin (COUMADIN) 5 MG tablet TAKE ONE TABLET BY MOUTH DAILY EXCEPT ONE-HALF  TABLET ON SUNDAY AND THURSDAY OR AS DIRECTED 03/12/22  Yes Jonelle Sidle, MD      Allergies    Patient has no known allergies.    Review of Systems   Review of Systems  Constitutional:  Negative for chills and fever.  Respiratory:  Positive for shortness of breath. Negative for cough.   Cardiovascular:  Positive for leg swelling. Negative for chest pain.       Reports orthopnea  Gastrointestinal:  Negative for abdominal pain, nausea and vomiting.  Genitourinary:  Negative for dysuria and hematuria.    Physical Exam Updated Vital Signs BP 122/80   Pulse 90   Temp 97.6 F (36.4 C) (Oral)   Resp 19   SpO2 98%  Physical Exam Vitals and nursing note reviewed.  Constitutional:      Appearance: Normal appearance.  HENT:     Head: Normocephalic and atraumatic.  Eyes:     General: No scleral icterus. Cardiovascular:     Rate and Rhythm: Normal rate.     Pulses: Normal pulses.  Pulmonary:     Effort: Pulmonary effort is normal. No respiratory distress.     Breath sounds: Normal breath sounds.     Comments: Speaking in full sentences with ease and satting 100% on room air without any increased work of breathing.  No respiratory distress, accessory muscle use, nasal flaring, tripoding,  or cyanosis present. Abdominal:     General: Bowel sounds are normal.     Palpations: Abdomen is soft.     Tenderness: There is no abdominal tenderness. There is no guarding or rebound.  Musculoskeletal:        General: No deformity.     Cervical back: Normal range of motion.     Right lower leg: Edema present.     Left lower leg: Edema present.     Comments: 2+ pitting edema bilaterally. Compartments are firm, but pliable. Palpable pulses. Sensation intact.   Skin:    General: Skin is warm and dry.  Neurological:     General: No focal deficit present.     Mental Status: He is alert. Mental status is at baseline.     ED Results / Procedures / Treatments   Labs (all labs ordered are  listed, but only abnormal results are displayed) Labs Reviewed  BRAIN NATRIURETIC PEPTIDE - Abnormal; Notable for the following components:      Result Value   B Natriuretic Peptide 1,057.0 (*)    All other components within normal limits  CBC WITH DIFFERENTIAL/PLATELET - Abnormal; Notable for the following components:   RBC 4.02 (*)    Hemoglobin 12.9 (*)    All other components within normal limits  COMPREHENSIVE METABOLIC PANEL - Abnormal; Notable for the following components:   Sodium 134 (*)    Glucose, Bld 110 (*)    All other components within normal limits  APTT - Abnormal; Notable for the following components:   aPTT 46 (*)    All other components within normal limits  PROTIME-INR - Abnormal; Notable for the following components:   Prothrombin Time 31.1 (*)    INR 3.0 (*)    All other components within normal limits    EKG None  Radiology DG Chest 2 View  Result Date: 03/29/2022 CLINICAL DATA:  Shortness of breath EXAM: CHEST - 2 VIEW COMPARISON:  05/17/2020 FINDINGS: Normal heart size, mediastinal contours, and pulmonary vascularity. Calcified lymph node at AP window. Increased interstitial infiltrates. No segmental consolidation or pneumothorax. Trace RIGHT pleural effusion. Atherosclerotic calcifications aorta. Bones demineralized. IMPRESSION: Scattered interstitial infiltrates favoring pulmonary edema. No segmental consolidation. Aortic Atherosclerosis (ICD10-I70.0). Electronically Signed   By: Ulyses Southward M.D.   On: 03/29/2022 11:26    Procedures Procedures   Medications Ordered in ED Medications  furosemide (LASIX) injection 40 mg (40 mg Intravenous Given 03/29/22 1236)    ED Course/ Medical Decision Making/ A&P                           Medical Decision Making Amount and/or Complexity of Data Reviewed Labs: ordered. Radiology: ordered.  Risk Prescription drug management. Decision regarding hospitalization.    81 y/o M presents to the ED for  evaluation of orthopnea and lower leg edema.  Differential diagnosis includes but not limited to CHF exacerbation, peripheral vascular disease, bronchitis, pneumonia, pneumothorax, pulmonary edema, pleural effusions.  Vital signs are unremarkable.  Physical exam as noted above.  We will order BNP and basic labs.  I independently reviewed and interpreted the patient's labs.  CBC shows no [place] ptosis.  Slight anemia with a hemoglobin of 12.9 which is only slightly lower from his baseline around 13.6.  CMP shows mild hyponatremia at 134.  Slightly elevated glucose at 110 otherwise no electrolyte or LFT abnormality.  APTT elevated at 46.  PT/INR at 3.  Patient is on  Coumadin and warfarin.  BNP significantly elevated at 1057.  I do not see any previous BNP to compare this to.  CXR shows scattered interstitial infiltrates favoring pulmonary edema. No segmental consolidation.  After discussion with my attending, 40 mg of IV Lasix given.  On ambulation, patient is satting 91% on room air.  1:59 PM Spoke with Dr. Carolan Clines with cardiology who recommends medical admission for further IV diuresis and they will see him on consults.   Patient and wife amenable to admission.   Admit to Dr. Valerie Salts.   Final Clinical Impression(s) / ED Diagnoses Final diagnoses:  Acute on chronic congestive heart failure, unspecified heart failure type [facility])    Rx / DC Orders ED Discharge Orders     None         Achille Rich, PA-C 03/29/22 1629    Bethann Berkshire, MD 03/29/22 1646

## 2022-03-29 NOTE — Telephone Encounter (Signed)
See below

## 2022-03-29 NOTE — Progress Notes (Signed)
ANTICOAGULATION CONSULT NOTE - Initial Up Consult   Pharmacy Consult for warfarin dosing  Indication: atrial fibrillation   No Known Allergies    Patient Measurements: 61.5 kg, 174 cm              Temp: 97.7 F (36.5 C) (08/25 1233) Temp Source: Oral (08/25 1233) BP: 114/88 (08/25 1330) Pulse Rate: 88 (08/25 1330)  Labs: Recent Labs    03/29/22 1031 03/29/22 1046  HGB  --  12.9*  HCT  --  39.7  PLT  --  154  APTT 46*  --   LABPROT 31.1*  --   INR 3.0*  --   CREATININE  --  1.02    Estimated Creatinine Clearance: 48.6 mL/min (by C-G formula based on SCr of 1.02 mg/dL).  Diet: n/a DDI: n/a ChadsVasc2 score: 4   Home warfarin regimen:  2.5 mg (5 mg x 0.5) every Sun, Thu; 5 mg (5 mg x 1) all other days  Goal of Therapy:  INR 2-3 Monitor platelets by anticoagulation protocol: Yes   Admit INR 3.0-therapeutic Lab Results  Component Value Date   INR 3.0 (H) 03/29/2022   INR 3.0 03/13/2022   INR 2.5 01/29/2022    Assessment: Martin James a 81 y.o. male requires anticoagulation with warfarin for the indication of  atrial fibrillation. Warfarin will be initiated inpatient following pharmacy protocol per pharmacy consult. Patient most recent blood work is as follows:    Latest Ref Rng & Units 03/29/2022   10:46 AM 07/20/2021    8:38 AM 07/04/2020   10:12 AM  CBC  WBC 4.0 - 10.5 K/uL 5.2  4.6    Hemoglobin 13.0 - 17.0 g/dL 89.3  73.4  28.7   Hematocrit 39.0 - 52.0 % 39.7  40.8  40.0   Platelets 150 - 400 K/uL 154  201.0       Plan: Will continue home regimen and give warfarin 5 mg X1 tonight. F/u INR  w/AM labs tom.  Monitor INR daily. Monitor for signs and symptoms of bleeding   Kerrin Champagne, PharmD, BCPS Clinical Pharmacist

## 2022-03-29 NOTE — Consult Note (Signed)
Cardiology Consultation:   Patient ID: Martin James MRN: [id number]; DOB: May 23, 1941  Admit date: 03/29/2022 Date of Consult: 03/29/2022  PCP:  [name], MD   [facility] Providers Cardiologist:  Nona Dell, MD        Patient Profile:   Martin James is a 81 y.o. male with a hx of ischemic CM EF 30-35% NYHA II-III Stage C, CAD s/p BMS x2 distal RCA 1998; DES x2 mid to distal RCA 2005, occluded distal Lcx with L-L collaterals, permament atrial fibrillation on coumadin, HTN, depression, who is being seen 03/29/2022 for the evaluation of decompensated HF at the request of the ED.  History of Present Illness:   Martin James presents after ~3 weeks of PND and orthopnea. He has slept in a chair over the past few nights. He has also noted worsening edema of his ankles. Otherwise, no new chest pressure with exertion or at rest.  Here he is HDS. O2 sat 91% with ambulation. Received IV lasix. BNP 1,057, crt is normal; Cxray shows pulmonary edema. He denies any changes in diet. No changes with his medications. He is a patient of Dr. Diona Browner. He has hx of ischemic CM with CAD above. He has attempted entresto however has had hypotension. He is maintained on BB. During his last visit earlier this month, SGLT2 was being price checked. He has not had frequent admissions for decompensated CHF. He has hx of permament atrial fibrillation on coumadin. INR predominantly 2-3. He has mod-severe low flow low gradient AS; he has preferred conservative management.    Past Medical History:  Diagnosis Date   Atrial fibrillation (HCC)    CAD (coronary artery disease)    BMS x 2 distal RCA 1998; DES x 2 mid to distal RCA 2005, had occluded distal circumflex with left to left collaterals as well   Cataract    Depression    Essential hypertension    Hyperlipidemia    Insomnia    Ischemic cardiomyopathy    Sinus congestion     Past Surgical History:  Procedure Laterality Date   APPENDECTOMY      CATARACT EXTRACTION W/PHACO Right 07/08/2013   Procedure: CATARACT EXTRACTION PHACO AND INTRAOCULAR LENS PLACEMENT (IOC);  Surgeon: Gemma Payor, MD;  Location: [facility];  Service: Ophthalmology;  Laterality: Right;  CDE 26.34   CORONARY ANGIOPLASTY     stents x4   RIGHT/LEFT HEART CATH AND CORONARY ANGIOGRAPHY N/A 07/04/2020   Procedure: RIGHT/LEFT HEART CATH AND CORONARY ANGIOGRAPHY;  Surgeon: Dolores Patty, MD;  Location: [facility];  Service: Cardiovascular;  Laterality: N/A;   ROTATOR CUFF REPAIR Right    Undescended testicle surgery       Home Medications:  Prior to Admission medications   Medication Sig Start Date End Date Taking? Authorizing Provider  aspirin EC 81 MG tablet Take 81 mg by mouth daily.   Yes [provider]  atorvastatin (LIPITOR) 80 MG tablet Take 1 tablet by mouth once daily 01/17/22  Yes [name], MD  digoxin (LANOXIN) 0.125 MG tablet TAKE 1 TABLET BY MOUTH ONCE DAILY **NEED  VISIT** 12/03/21  Yes Jonelle Sidle, MD  furosemide (LASIX) 20 MG tablet Take 1 tablet by mouth once daily 02/18/22  Yes Jonelle Sidle, MD  hydrOXYzine (ATARAX) 10 MG tablet Take 1 tablet (10 mg total) by mouth every evening. For sedation, ok to take 2 tabs if needed to help fall asleep. 03/22/22  Yes Dulce Sellar, NP  isosorbide mononitrate (IMDUR) 30 MG 24 hr tablet Take 1 tablet (30 mg total) by mouth daily. 10/29/21  Yes Satira Sark, MD  loratadine (CLARITIN) 10 MG tablet Take 10 mg by mouth daily as needed for allergies.   Yes [provider]  metoprolol succinate (TOPROL-XL) 50 MG 24 hr tablet TAKE 1 TABLET BY MOUTH ONCE DAILY WITH SUPPER 11/12/21  Yes Satira Sark, MD  warfarin (COUMADIN) 5 MG tablet TAKE ONE TABLET BY MOUTH DAILY EXCEPT ONE-HALF TABLET ON SUNDAY AND THURSDAY OR AS DIRECTED 03/12/22  Yes Satira Sark, MD    Inpatient Medications: Scheduled Meds:  furosemide  20 mg Intravenous Q12H   warfarin  5 mg Oral  ONCE-1600   Warfarin - Pharmacist Dosing Inpatient   Does not apply q1600   Continuous Infusions:  PRN Meds:   Allergies:   No Known Allergies  Social History:   Social History   Socioeconomic History   Marital status: Married    Spouse name: Not on file   Number of children: 2   Years of education: Not on file   Highest education level: Not on file  Occupational History   Occupation: retired  Tobacco Use   Smoking status: Former    Packs/day: 0.25    Years: 30.00    Total pack years: 7.50    Types: Cigarettes    Quit date: 08/05/1990    Years since quitting: 31.6   Smokeless tobacco: Never  Substance and Sexual Activity   Alcohol use: Yes    Alcohol/week: 2.0 standard drinks of alcohol    Types: 1 Glasses of wine, 1 Cans of beer per week    Comment: occassional   Drug use: No   Sexual activity: Not on file  Other Topics Concern   Not on file  Social History Narrative   Married 1964. 2 kids-son (Dubois- in between businesses in 2021 looking at retring), daughter (PA- had guillain barre syndrome). 5 grandkids.       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  All other ROS reviewed and negative.     Physical Exam/Data:   Vitals:   03/29/22 1026 03/29/22 1130 03/29/22 1233 03/29/22 1330  BP: 114/72 116/78 122/80 114/88  Pulse: 86 90 90 88  Resp: 15 14 19 11   Temp: 97.6 F (36.4 C)  97.7 F (36.5 C)   TempSrc: Oral  Oral    SpO2: 100% 98% 98% 100%   No intake or output data in the 24 hours ending 03/29/22 1525    03/22/2022   11:13 AM 03/13/2022    8:15 AM 09/06/2021    8:16 AM  Last 3 Weights  Weight (lbs) 135 lb 8 oz 136 lb 12.8 oz 131 lb  Weight (kg) 61.462 kg 62.052 kg 59.421 kg  There is no height or weight on file to calculate BMI.  General:  Older, thin male HEENT: normal Neck: no JVD Vascular: No carotid bruits; Distal pulses 2+ bilaterally Cardiac:  normal S1, S2; irregular rhythm Lungs:  clear to auscultation bilaterally, no wheezing, rhonchi or rales  Abd: soft, nontender, no hepatomegaly  Ext: no edema Musculoskeletal:  No deformities, BUE and BLE strength normal and equal Skin: warm and dry  Neuro:  CNs 2-12 intact, no focal abnormalities noted Psych:  Normal affect   EKG:  The EKG was personally reviewed and demonstrates:  afib, PVCs  Telemetry:  Telemetry was personally reviewed and demonstrates:  atrial fibrillation, PVCs rate controlled  Relevant CV Studies: TTE 1. Left ventricular ejection fraction, by estimation, is 30 to 35%. The  left ventricle has moderately decreased function. The left ventricle  demonstrates global hypokinesis. Left ventricular diastolic parameters are  indeterminate. The average left  ventricular global longitudinal strain is -11.5 %. The global longitudinal  strain is abnormal.   2. Right ventricular systolic function is normal. The right ventricular  size is normal.   3. Left atrial size was severely dilated.   4. Right atrial size was mildly dilated.   5. The mitral valve is abnormal. Mild mitral valve regurgitation. No  evidence of mitral stenosis.   6. The tricuspid valve is abnormal.   7. Low flow low gradient moderate to severe aortic stenosis. Mean grad  18, AVA VTI 0.84, DI 0.27.Marland Kitchen The aortic valve has an indeterminant number  of cusps. There is moderate calcification of the aortic valve. There is  moderate thickening of the aortic   valve. Aortic valve regurgitation is mild to moderate.   8. The inferior vena cava is normal in size with greater than 50%  respiratory variability, suggesting right atrial pressure of 3 mmHg.   Comparison(s): LVEF 20-25%, Mild-Mod MR, Mild-Mod AS.   Mid RCA to Dist RCA lesion is 100% stenosed. Mid Cx to Dist Cx lesion is 99% stenosed. Prox LAD to Mid LAD lesion is 40% stenosed. Prox Cx to Mid Cx lesion is 30% stenosed.    RHC/LHC 07/04/2020 Findings:   Ao = 109/60 LV = 113/5 RA =  1 RV = 25/1 PA = 24/7 (14) PCW = 7 Fick cardiac output/index = 4.7/2.6 PVR = 1.5 WU FA sat = 99% PA sat = 71%, 76%   Assessment: 1.  2v CAD with total occlusion of mRCA with good L -> R collaterals and high grade lesion in distal AV-groove LCX with L->L collaterals 2. EF 20% 3. Low filling pressures with normal cardiac output  Laboratory Data:  High Sensitivity Troponin:  No results for input(s): "TROPONINIHS" in the last 720 hours.   Chemistry Recent Labs  Lab 03/29/22 1046  NA 134*  K 4.1  CL 99  CO2 27  GLUCOSE 110*  BUN 19  CREATININE 1.02  CALCIUM 8.9  GFRNONAA >60  ANIONGAP 8    Recent Labs  Lab 03/29/22 1046  PROT 6.9  ALBUMIN 3.8  AST 32  ALT 22  ALKPHOS 47  BILITOT 1.2   Lipids No results for input(s): "CHOL", "TRIG", "HDL", "LABVLDL", "LDLCALC", "CHOLHDL" in the last 168 hours.  Hematology Recent Labs  Lab 03/29/22 1046  WBC 5.2  RBC 4.02*  HGB 12.9*  HCT 39.7  MCV 98.8  MCH 32.1  MCHC 32.5  RDW 13.7  PLT 154   Thyroid No results for input(s): "TSH", "FREET4" in the last 168 hours.  BNP  Recent Labs  Lab 03/29/22 1046  BNP 1,057.0*    DDimer No results for input(s): "DDIMER" in the last 168 hours.   Radiology/Studies:  DG Chest 2 View  Result Date: 03/29/2022 CLINICAL DATA:  Shortness of breath EXAM: CHEST - 2 VIEW COMPARISON:  05/17/2020 FINDINGS: Normal heart size, mediastinal contours, and pulmonary vascularity. Calcified lymph node at AP  window. Increased interstitial infiltrates. No segmental consolidation or pneumothorax. Trace RIGHT pleural effusion. Atherosclerotic calcifications aorta. Bones demineralized. IMPRESSION: Scattered interstitial infiltrates favoring pulmonary edema. No segmental consolidation. Aortic Atherosclerosis (ICD10-I70.0). Electronically Signed   By: Ulyses Southward M.D.   On: 03/29/2022 11:26     Assessment and Plan:   Ischemic Systolic HF EF 30-35% NYHA Class II-III  Stage C. Here with decompensated CHF. He has + PND/orthopnea and pitting LE edema. He denies changes in diet. No new chest pressure with activity. BNP 1,057; Cxray shows pulmonary edema. In terms of GDMT, did not tolerate entresto in the past with low Bps. Not on aldactone. Working on a Museum/gallery curator for SGLT2 as an outpatient.  -continue IV lasix 20 mg BID; likely can transition to oral lasix 20 mg daily in a day or two. As long as he can lie flat comfortably and ambulate without desaturation -limited echo -continue home imdur 30 mg daily -continue home metoprolol 50 mg XL - if Bps allow, can attempt low dose losartan -continue statin  -can stop ASA on AC -Follow up with Dr. Diona Browner or APP a 2-4 weeks post discharge  Permanent atrial fibrillation: on coumadin. Rate controlled. Continue home digoxin 0.125 mg daily.  Mod-Severe Low flow low gradient AS. Mean gradient 18 mmhg, AVA 0.84cm2, DI 0.27. Discussed with Dr. Diona Browner and plan is for conservative management.  Aim to not over diurese  Risk Assessment/Risk Scores:        New York Heart Association (NYHA) Functional Class NYHA Class III  CHA2DS2-VASc Score = 5   This indicates a 7.2% annual risk of stroke. The patient's score is based upon: CHF History: 1 HTN History: 1 Diabetes History: 0 Stroke History: 0 Vascular Disease History: 1 Age Score: 2 Gender Score: 0         For questions or updates, please contact [facility] Please consult [email or web address] for contact  info under    Signed, Maisie Fus, MD  03/29/2022 3:25 PM

## 2022-03-29 NOTE — Plan of Care (Signed)

## 2022-03-29 NOTE — ED Triage Notes (Signed)
Pt having to sit up to sleep due to cant breathe laying down, restless leg x 2 weeks. Ble x 1 day per pt. Ble swelling noted with left worse than right with one plus pitting. No resp distress or sob in triage. Ambulatory. Pt states does have hx of CHF and AFib

## 2022-03-29 NOTE — Telephone Encounter (Signed)
Pt currently in ER

## 2022-03-29 NOTE — ED Notes (Signed)
Pt states that he takes his warfarin after dinner/at 5pm. States he would like to stay on his medication schedule for warfarin

## 2022-03-29 NOTE — Telephone Encounter (Signed)
Caller States: -swollen feet this morning -pt has never had this before -feet are twice the size. -Having a lot of trouble sleeping -she is very concerned.   Caller referred to PCP triage nurse.  Pt wanted to disconnect to care for the patient Spoke with Sherron Ales, patient coordinator with [facility].  Awaiting follow up notes

## 2022-03-29 NOTE — Telephone Encounter (Signed)
Patient Name: Martin James Gender: Male DOB: 1941-02-11 Age: 81 Y 3 M 30 D Return Phone Number: [phone] (Primary), [phone] (Secondary) Address: City/ State/ Zip: [place] [address] Client  [facility] - Armed forces training and education officer [facility] Provider Tana Conch- MD Contact Type Call Who Is Calling Patient / Member / Family / Caregiver Call Type Triage / Clinical Caller Name Britta Mccreedy Relationship To Patient Spouse Return Phone Number [phone] (Secondary) Chief Complaint Feet swelling Reason for Call Symptomatic / Request for Health Information Initial Comment Caller states her husband is having insomnia and feet swelling. Translation No Nurse Assessment Nurse: Andrey Campanile, RN, Marylene Land Date/Time Martin James Time): 03/29/2022 9:13:05 AM Confirm and document reason for call. If symptomatic, describe symptoms. ---Caller states her husband is having insomnia and feet swelling. Saw NP a little over a week ago, she prescribed a sedative and it's not working. Left foot is worse than the right, feet are hard to touch. Does the patient have any new or worsening symptoms? ---Yes Will a triage be completed? ---Yes Related visit to physician within the last 2 weeks? ---Yes Does the PT have any chronic conditions? (i.e. diabetes, asthma, this includes High risk factors for pregnancy, etc.) ---Yes List chronic conditions. ---A-Fib Is this a behavioral health or substance abuse call? ---No Guidelines Guideline Title Affirmed Question Affirmed Notes Nurse Date/Time Martin James Time) Leg Swelling and Edema SEVERE difficulty breathing (e.g., struggling for each breath, speaks in single words) Andrey Campanile, RN, Marylene Land 03/29/2022 9:15:37 AM  Disp. Time Martin James Time) Disposition Final User 03/29/2022 9:17:50 AM Call EMS 911 Now Yes Andrey Campanile, RN, Marylene Land 03/29/2022 9:23:05 AM 911 Outcome Documentation Andrey Campanile, RN, Marylene Land Reason: Refuses  to call 911, wife will take patient to [facility] ER. Final Disposition 03/29/2022 9:17:50 AM Call EMS 911 Now Yes Andrey Campanile, RN, Rosalyn Charters Disagree/Comply Comply Caller Understands Yes PreDisposition Call Doctor Care Advice Given Per Guideline CALL EMS 911 NOW: * Immediate medical attention is needed. You need to hang up and call 911 (or an ambulance). * Triager Discretion: I'll call you back in a few minutes to be sure you were able to reach them. Comments User: Martin Dice, RN Date/Time Martin James Time): 03/29/2022 9:16:49 AM States he has trouble breathing since 03/13/2022 Referrals GO TO FACILITY UNDECIDED

## 2022-03-29 NOTE — H&P (Signed)
Patient Demographics:    Martin James, is a 81 y.o. male  MRN: [id number]   DOB - 1940/11/04  Admit Date - 03/29/2022  Outpatient Primary MD for the patient is [name], MD   Assessment & Plan:   Assessment and Plan:   1) acute on chronic systolic dysfunction CHF/HFrEF--- Echo from March 2023 with EF of 30 to 35% - BNP elevated at 1057 -Chest x-ray consistent with pulmonary edema -Patient with dyspnea on exertion and orthopnea -Admits to noncompliance with salt restriction -Treat empirically with IV Lasix -Daily weights and fluid output and input monitoring -Patient previously did not tolerate Entresto, tends to have soft blood pressure-at baseline -Continue digoxin, check dig level  2)Moderate to Severe Aortic Stenosis--- contributory to #1 above -Echo from March 2023 showed--moderate to severe low-flow aortic stenosis with mean gradient 18 mmHg and dimensionless index 0.27. -Hold isosorbide -Reduce metoprolol to 25 mg daily from 50 mg daily  3)H/o CAD/ischemic cardiomyopathy--BMS x 2 distal RCA 1998; DES x 2 mid to distal RCA 2005, had occluded distal circumflex with left to left collaterals as well -No chest pains or ACS type symptoms -Continue aspirin , Lipitor and Coumadin -Hold isosorbide due to soft BP -Metoprolol as above #2  4)Permanent atrial fibrillation--- CHA2DS2-VASc score is 4 -Continue Coumadin for anticoagulation -Metoprolol as above #2 -Continue digoxin as above #1  5)Social/Ethics--- plan of care and advanced directive discussed with patient and his wife -Patient request DNR/DNI status  Disposition/Need for in-Hospital Stay- patient unable to be discharged at this time due to --acute CHF exacerbation requiring IV diuretics and further monitoring  Status is:  Inpatient  Remains inpatient appropriate because:   Dispo: The patient is from: Home              Anticipated d/c is to: Home              Anticipated d/c date is: 2 days              Patient currently is not medically stable to d/c. Barriers: Not Clinically Stable-   With History of - Reviewed by me  Past Medical History:  Diagnosis Date   Atrial fibrillation (HCC)    CAD (coronary artery disease)    BMS x 2 distal RCA 1998; DES x 2 mid to distal RCA 2005, had occluded distal circumflex with left to left collaterals as well   Cataract    Depression    Essential hypertension    Hyperlipidemia    Insomnia    Ischemic cardiomyopathy    Sinus congestion       Past Surgical History:  Procedure Laterality Date   APPENDECTOMY     CATARACT EXTRACTION W/PHACO Right 07/08/2013   Procedure: CATARACT EXTRACTION PHACO AND INTRAOCULAR LENS PLACEMENT (IOC);  Surgeon: Gemma Payor, MD;  Location: [facility];  Service: Ophthalmology;  Laterality: Right;  CDE 26.34   CORONARY ANGIOPLASTY     stents  x4   RIGHT/LEFT HEART CATH AND CORONARY ANGIOGRAPHY N/A 07/04/2020   Procedure: RIGHT/LEFT HEART CATH AND CORONARY ANGIOGRAPHY;  Surgeon: Dolores Patty, MD;  Location: [facility];  Service: Cardiovascular;  Laterality: N/A;   ROTATOR CUFF REPAIR Right    Undescended testicle surgery      Chief Complaint  Patient presents with   Leg Swelling      HPI:    Martin James  is a 81 y.o. male who is a reformed smoker with past medical history relevant for moderate to severe aortic stenosis, chronic systolic dysfunction CHF with EF of 30 to 55%, chronic atrial fibrillation with chronic anticoagulation, HTN who presents to the ED with complaints of worsening shortness of breath, dyspnea on exertion and orthopnea over the last several days - As per patient's wife patient is not always compliant with salt restriction.. -Has increasing leg swelling -No fevers no chills no productive cough -No  leg pains or pleuritic type symptoms -INR 3.0,  BNP elevated at 1057 -Chest x-ray consistent with pulmonary edema -CBC mostly unremarkable -Creatinine 1.0, sodium 134 -LFTs are not elevated    Review of systems:    In addition to the HPI above,   A full Review of  Systems was done, all other systems reviewed are negative except as noted above in HPI , .    Social History:  Reviewed by me    Social History   Tobacco Use   Smoking status: Former    Packs/day: 0.25    Years: 30.00    Total pack years: 7.50    Types: Cigarettes    Quit date: 08/05/1990    Years since quitting: 31.6   Smokeless tobacco: Never  Substance Use Topics   Alcohol use: Yes    Alcohol/week: 2.0 standard drinks of alcohol    Types: 1 Glasses of wine, 1 Cans of beer per week    Comment: occassional    Family History :  Reviewed by me    Family History  Problem Relation Age of Onset   Diabetes Father    Stroke Father    Colon cancer Neg Hx    Rectal cancer Neg Hx    Stomach cancer Neg Hx     Home Medications:   Prior to Admission medications   Medication Sig Start Date End Date Taking? Authorizing Provider  aspirin EC 81 MG tablet Take 81 mg by mouth daily.   Yes [provider]  atorvastatin (LIPITOR) 80 MG tablet Take 1 tablet by mouth once daily 01/17/22  Yes [name], MD  digoxin (LANOXIN) 0.125 MG tablet TAKE 1 TABLET BY MOUTH ONCE DAILY **NEED  VISIT** 12/03/21  Yes Jonelle Sidle, MD  furosemide (LASIX) 20 MG tablet Take 1 tablet by mouth once daily 02/18/22  Yes Jonelle Sidle, MD  hydrOXYzine (ATARAX) 10 MG tablet Take 1 tablet (10 mg total) by mouth every evening. For sedation, ok to take 2 tabs if needed to help fall asleep. 03/22/22  Yes Dulce Sellar, NP  isosorbide mononitrate (IMDUR) 30 MG 24 hr tablet Take 1 tablet (30 mg total) by mouth daily. 10/29/21  Yes Jonelle Sidle, MD  loratadine (CLARITIN) 10 MG tablet Take 10 mg by mouth daily as needed  for allergies.   Yes [provider]  metoprolol succinate (TOPROL-XL) 50 MG 24 hr tablet TAKE 1 TABLET BY MOUTH ONCE DAILY WITH SUPPER 11/12/21  Yes Jonelle Sidle, MD  warfarin (COUMADIN) 5 MG  tablet TAKE ONE TABLET BY MOUTH DAILY EXCEPT ONE-HALF TABLET ON SUNDAY AND THURSDAY OR AS DIRECTED 03/12/22  Yes Jonelle Sidle, MD     Allergies:    No Known Allergies   Physical Exam:   Vitals  Blood pressure 120/77, pulse 97, temperature 97.6 F (36.4 C), temperature source Oral, resp. rate 18, weight 58.2 kg, SpO2 100 %.  Physical Examination: General appearance - alert,  in no distress  Mental status - alert, oriented to person, place, and time,  Eyes - sclera anicteric Neck - supple, no JVD elevation , Chest -diminished in bases, faint bibasilar rales  heart - S1 and S2 normal, irregularly irregular, 3/6 systolic murmur Abdomen - soft, nontender, nondistended, +BS Neurological - screening mental status exam normal, neck supple without rigidity, cranial nerves II through XII intact, DTR's normal and symmetric Extremities - +ve pedal edema noted, intact peripheral pulses  Skin - warm, dry   Data Review:    CBC Recent Labs  Lab 03/29/22 1046 03/29/22 1525  WBC 5.2 5.3  HGB 12.9* 13.1  HCT 39.7 39.5  PLT 154 171  MCV 98.8 96.8  MCH 32.1 32.1  MCHC 32.5 33.2  RDW 13.7 13.9  LYMPHSABS 0.8  --   MONOABS 0.5  --   EOSABS 0.4  --   BASOSABS 0.1  --    ------------------------------------------------------------------------------------------------------------------  Chemistries  Recent Labs  Lab 03/29/22 1046  NA 134*  K 4.1  CL 99  CO2 27  GLUCOSE 110*  BUN 19  CREATININE 1.02  CALCIUM 8.9  AST 32  ALT 22  ALKPHOS 47  BILITOT 1.2   ------------------------------------------------------------------------------------------------------------------ estimated creatinine clearance is 46 mL/min (by C-G formula based on SCr of 1.02  mg/dL). ------------------------------------------------------------------------------------------------------------------ Coagulation profile Recent Labs  Lab 03/29/22 1031  INR 3.0*   ------------------------------------------------------------------------------------------------------------------    Component Value Date/Time   BNP 1,057.0 (H) 03/29/2022 1046   Urinalysis    Component Value Date/Time   COLORURINE YELLOW 10/01/2019 1000   APPEARANCEUR CLEAR 10/01/2019 1000   LABSPEC 1.013 10/01/2019 1000   PHURINE 5.0 10/01/2019 1000   GLUCOSEU NEGATIVE 10/01/2019 1000   HGBUR SMALL (A) 10/01/2019 1000   BILIRUBINUR neg 11/12/2019 0859   KETONESUR NEGATIVE 10/01/2019 1000   PROTEINUR Negative 11/12/2019 0859   PROTEINUR NEGATIVE 10/01/2019 1000   UROBILINOGEN 0.2 11/12/2019 0859   NITRITE neg 11/12/2019 0859   NITRITE NEGATIVE 10/01/2019 1000   LEUKOCYTESUR Negative 11/12/2019 0859   LEUKOCYTESUR NEGATIVE 10/01/2019 1000    ----------------------------------------------------------------------------------------------------------------   Imaging Results:    DG Chest 2 View  Result Date: 03/29/2022 CLINICAL DATA:  Shortness of breath EXAM: CHEST - 2 VIEW COMPARISON:  05/17/2020 FINDINGS: Normal heart size, mediastinal contours, and pulmonary vascularity. Calcified lymph node at AP window. Increased interstitial infiltrates. No segmental consolidation or pneumothorax. Trace RIGHT pleural effusion. Atherosclerotic calcifications aorta. Bones demineralized. IMPRESSION: Scattered interstitial infiltrates favoring pulmonary edema. No segmental consolidation. Aortic Atherosclerosis (ICD10-I70.0). Electronically Signed   By: Ulyses Southward M.D.   On: 03/29/2022 11:26    Radiological Exams on Admission: DG Chest 2 View  Result Date: 03/29/2022 CLINICAL DATA:  Shortness of breath EXAM: CHEST - 2 VIEW COMPARISON:  05/17/2020 FINDINGS: Normal heart size, mediastinal contours, and  pulmonary vascularity. Calcified lymph node at AP window. Increased interstitial infiltrates. No segmental consolidation or pneumothorax. Trace RIGHT pleural effusion. Atherosclerotic calcifications aorta. Bones demineralized. IMPRESSION: Scattered interstitial infiltrates favoring pulmonary edema. No segmental consolidation. Aortic Atherosclerosis (ICD10-I70.0). Electronically Signed   By: Loraine Leriche  Tyron Russell M.D.   On: 03/29/2022 11:26    DVT Prophylaxis -SCD/Coumadin AM Labs Ordered, also please review Full Orders  Family Communication: Admission, patients condition and plan of care including tests being ordered have been discussed with the patient and wife who indicate understanding and agree with the plan   Condition  -stable  Shon Hale M.D on 03/29/2022 at 7:01 PM Go to [email or web address] -  for contact info  [facility] - Office  [phone]

## 2022-03-29 NOTE — ED Notes (Addendum)
Pt SpO2 while ambulating around the nurses station was 91% on RA--PA-C made aware

## 2022-03-30 DIAGNOSIS — I5023 Acute on chronic systolic (congestive) heart failure: Secondary | ICD-10-CM | POA: Diagnosis not present

## 2022-03-30 LAB — CBC
HCT: 41.3 % (ref 39.0–52.0)
Hemoglobin: 13.7 g/dL (ref 13.0–17.0)
MCH: 32.1 pg (ref 26.0–34.0)
MCHC: 33.2 g/dL (ref 30.0–36.0)
MCV: 96.7 fL (ref 80.0–100.0)
Platelets: 164 10*3/uL (ref 150–400)
RBC: 4.27 MIL/uL (ref 4.22–5.81)
RDW: 13.7 % (ref 11.5–15.5)
WBC: 5.9 10*3/uL (ref 4.0–10.5)
nRBC: 0 % (ref 0.0–0.2)

## 2022-03-30 LAB — BASIC METABOLIC PANEL
Anion gap: 9 (ref 5–15)
BUN: 21 mg/dL (ref 8–23)
CO2: 29 mmol/L (ref 22–32)
Calcium: 9.1 mg/dL (ref 8.9–10.3)
Chloride: 99 mmol/L (ref 98–111)
Creatinine, Ser: 1.26 mg/dL — ABNORMAL HIGH (ref 0.61–1.24)
GFR, Estimated: 57 mL/min — ABNORMAL LOW (ref >60)
Glucose, Bld: 90 mg/dL (ref 70–99)
Potassium: 3.8 mmol/L (ref 3.5–5.1)
Sodium: 137 mmol/L (ref 135–145)

## 2022-03-30 LAB — PROTIME-INR
INR: 2.6 — ABNORMAL HIGH (ref 0.8–1.2)
Prothrombin Time: 27.4 seconds — ABNORMAL HIGH (ref 11.4–15.2)

## 2022-03-30 LAB — DIGOXIN LEVEL: Digoxin Level: 1.3 ng/mL (ref 0.8–2.0)

## 2022-03-30 MED ORDER — TRAZODONE HCL 50 MG PO TABS
50.0000 mg | ORAL_TABLET | Freq: Every day | ORAL | Status: DC
  Administered 2022-03-30: 50 mg via ORAL
  Filled 2022-03-30: qty 1

## 2022-03-30 MED ORDER — WARFARIN SODIUM 5 MG PO TABS
5.0000 mg | ORAL_TABLET | Freq: Once | ORAL | Status: AC
Start: 2022-03-30 — End: 2022-03-30
  Administered 2022-03-30: 5 mg via ORAL
  Filled 2022-03-30: qty 1

## 2022-03-30 MED ORDER — TRAZODONE HCL 50 MG PO TABS: 100.0000 mg | ORAL_TABLET | Freq: Every day | ORAL | Status: DC

## 2022-03-30 MED ORDER — FUROSEMIDE 10 MG/ML IJ SOLN
20.0000 mg | Freq: Every day | INTRAMUSCULAR | Status: DC
  Administered 2022-03-31: 20 mg via INTRAVENOUS
  Filled 2022-03-30: qty 2

## 2022-03-30 NOTE — Progress Notes (Signed)
Pressures soft hold Metoprolol per MD.

## 2022-03-30 NOTE — TOC Progression Note (Signed)
  Transition of Care [facility]) Screening Note   Patient Details  Name: Martin James Date of Birth: 02-03-1941   Transition of Care [facility]) CM/SW Contact:    Elliot Gault, LCSW Phone Number: 03/30/2022, 11:55 AM    Transition of Care Department [facility]) has reviewed patient and no TOC needs have been identified at this time. We will continue to monitor patient advancement through interdisciplinary progression rounds. If new patient transition needs arise, please place a TOC consult.

## 2022-03-30 NOTE — Progress Notes (Signed)
PROGRESS NOTE     Martin James, is a 81 y.o. male, DOB - September 05, 1940, QMG:[id number]  Admit date - 03/29/2022   Admitting Physician Julyanna Scholle Mariea Clonts, MD  Outpatient Primary MD for the patient is Martin Majestic, MD  LOS - 1  Chief Complaint  Patient presents with   Leg Swelling        Brief Narrative:  81 y.o. male who is a reformed smoker with past medical history relevant for moderate to severe aortic stenosis, chronic systolic dysfunction CHF with EF of 30 to 55%, chronic atrial fibrillation with chronic anticoagulation, HTN admitted on 03/29/2022 with acute on chronic systolic dysfunction/exacerbation    -Assessment and Plan: 1) acute on chronic systolic dysfunction CHF/HFrEF--- Echo from March 2023 with EF of 30 to 35% - BNP elevated at 1057 -Chest x-ray consistent with pulmonary edema -Admits to noncompliance with salt restriction -Daily weights and fluid output and input monitoring -Patient previously did not tolerate Entresto, tends to have soft blood pressure-at baseline -Dig level is therapeutic, continue digoxin -Blood pressure is soft making it challenging to aggressively diurese him -Dyspnea on exertion improving slowly -Voiding well, no hypoxia -We will hold metoprolol today to allow BP room for diuresis --Fluid balance is inadequate/inaccurate   2)Moderate to Severe Aortic Stenosis--- contributory to #1 above -Echo from March 2023 showed--moderate to severe low-flow aortic stenosis with mean gradient 18 mmHg and dimensionless index 0.27. -Hold isosorbide -Hold metoprolol due to soft BP to give room for IV diuresis   3)H/o CAD/ischemic cardiomyopathy--BMS x 2 distal RCA 1998; DES x 2 mid to distal RCA 2005, had occluded distal circumflex with left to left collaterals as well -No chest pains or ACS type symptoms -Continue aspirin , Lipitor and Coumadin -Hold isosorbide due to soft BP -Metoprolol as above #2   4)Permanent atrial fibrillation--- CHA2DS2-VASc  score is 4 -Continue Coumadin for anticoagulation -Metoprolol as above #2 -Continue digoxin as above #1   5)Social/Ethics--- plan of care and advanced directive discussed with patient and his wife -Patient request DNR/DNI status   Disposition/Need for in-Hospital Stay- patient unable to be discharged at this time due to --acute CHF exacerbation requiring IV diuretics and further monitoring   Status is: Inpatient   Remains inpatient appropriate because:    Dispo: The patient is from: Home              Anticipated d/c is to: Home              Anticipated d/c date is: 1 days              Patient currently is not medically stable to d/c. Barriers: Not Clinically Stable-   Code Status : -  Code Status: DNR   Family Communication:    (patient is alert, awake and coherent)  Discussed with patient's wife  DVT Prophylaxis  :   - SCDs   SCDs Start: 03/29/22 1853 Place TED hose Start: 03/29/22 1853   Lab Results  Component Value Date   PLT 164 03/30/2022    Inpatient Medications  Scheduled Meds:  aspirin EC  81 mg Oral Q breakfast   atorvastatin  80 mg Oral Daily   digoxin  0.125 mg Oral Daily   [START ON 03/31/2022] furosemide  20 mg Intravenous Daily   hydrOXYzine  10 mg Oral QPM   metoprolol succinate  25 mg Oral Q supper   sodium chloride flush  3 mL Intravenous Q12H   sodium chloride flush  3 mL  Intravenous Q12H   traZODone  50 mg Oral QHS   Warfarin - Pharmacist Dosing Inpatient   Does not apply q1600   Continuous Infusions:  sodium chloride     PRN Meds:.sodium chloride, acetaminophen **OR** acetaminophen, bisacodyl, loratadine, ondansetron **OR** ondansetron (ZOFRAN) IV, polyethylene glycol, sodium chloride flush   Anti-infectives (From admission, onward)    None         Subjective: Izell Dodson today has no fevers, no emesis,  No chest pain,   -Dyspnea on exertion improving --voiding well -No hypoxia -Blood pressure has been soft  Objective: Vitals:    03/30/22 0148 03/30/22 0440 03/30/22 1015 03/30/22 1309  BP: (!) 104/57 107/72 107/72 (!) 106/93  Pulse: 94 91 91 100  Resp: 18 18 18 16   Temp: (!) 97.5 F (36.4 C) 97.6 F (36.4 C) 97.6 F (36.4 C) 97.6 F (36.4 C)  TempSrc: Oral Oral Oral Oral  SpO2: 97% 98%  98%  Weight:   58.2 kg   Height:   5' 8.5" (1.74 m)     Intake/Output Summary (Last 24 hours) at 03/30/2022 1817 Last data filed at 03/30/2022 1307 Gross per 24 hour  Intake 1312 ml  Output 600 ml  Net 712 ml   Filed Weights   03/29/22 1743 03/30/22 1015  Weight: 58.2 kg 58.2 kg    Physical Exam  Gen:- Awake Alert,  in no apparent distress  HEENT:- [place].AT, No sclera icterus Neck-Supple Neck,No JVD,.  Lungs-improving air movement, no wheezing  CV- S1, S2 normal, regular  Abd-  +ve B.Sounds, Abd Soft, No tenderness,    Extremity/Skin:-Improving edema, pedal pulses present  Psych-affect is appropriate, oriented x3 Neuro-no new focal deficits, no tremors  Data Reviewed: I have personally reviewed following labs and imaging studies  CBC: Recent Labs  Lab 03/29/22 1046 03/29/22 1525 03/30/22 0444  WBC 5.2 5.3 5.9  NEUTROABS 3.4  --   --   HGB 12.9* 13.1 13.7  HCT 39.7 39.5 41.3  MCV 98.8 96.8 96.7  PLT 154 171 164   Basic Metabolic Panel: Recent Labs  Lab 03/29/22 1046 03/30/22 0444  NA 134* 137  K 4.1 3.8  CL 99 99  CO2 27 29  GLUCOSE 110* 90  BUN 19 21  CREATININE 1.02 1.26*  CALCIUM 8.9 9.1   GFR: Estimated Creatinine Clearance: 37.2 mL/min (A) (by C-G formula based on SCr of 1.26 mg/dL (H)). Liver Function Tests: Recent Labs  Lab 03/29/22 1046  AST 32  ALT 22  ALKPHOS 47  BILITOT 1.2  PROT 6.9  ALBUMIN 3.8   Cardiac Enzymes: No results for input(s): "CKTOTAL", "CKMB", "CKMBINDEX", "TROPONINI" in the last 168 hours. BNP (last 3 results) No results for input(s): "PROBNP" in the last 8760 hours. HbA1C: No results for input(s): "HGBA1C" in the last 72 hours. Sepsis  Labs: @LABRCNTIP (procalcitonin:4,lacticidven:4) )No results found for this or any previous visit (from the past 240 hour(s)).    Radiology Studies: DG Chest 2 View  Result Date: 03/29/2022 CLINICAL DATA:  Shortness of breath EXAM: CHEST - 2 VIEW COMPARISON:  05/17/2020 FINDINGS: Normal heart size, mediastinal contours, and pulmonary vascularity. Calcified lymph node at AP window. Increased interstitial infiltrates. No segmental consolidation or pneumothorax. Trace RIGHT pleural effusion. Atherosclerotic calcifications aorta. Bones demineralized. IMPRESSION: Scattered interstitial infiltrates favoring pulmonary edema. No segmental consolidation. Aortic Atherosclerosis (ICD10-I70.0). Electronically Signed   By: 03/31/2022 M.D.   On: 03/29/2022 11:26     Scheduled Meds:  aspirin EC  81 mg Oral  Q breakfast   atorvastatin  80 mg Oral Daily   digoxin  0.125 mg Oral Daily   [START ON 03/31/2022] furosemide  20 mg Intravenous Daily   hydrOXYzine  10 mg Oral QPM   metoprolol succinate  25 mg Oral Q supper   sodium chloride flush  3 mL Intravenous Q12H   sodium chloride flush  3 mL Intravenous Q12H   traZODone  50 mg Oral QHS   Warfarin - Pharmacist Dosing Inpatient   Does not apply q1600   Continuous Infusions:  sodium chloride       LOS: 1 day    Shon Hale M.D on 03/30/2022 at 6:17 PM  Go to [email or web address] - for contact info  [facility] - Office  [phone]  If 7PM-7AM, please contact night-coverage [email or web address] 03/30/2022, 6:17 PM

## 2022-03-30 NOTE — Progress Notes (Signed)
ANTICOAGULATION CONSULT NOTE - Follow Up Consult   Pharmacy Consult for warfarin dosing  Indication: atrial fibrillation   No Known Allergies    Patient Measurements: 61.5 kg, 174 cm              Temp: 97.6 F (36.4 C) (08/26 0440) Temp Source: Oral (08/26 0440) BP: 107/72 (08/26 0440) Pulse Rate: 91 (08/26 0440)  Labs: Recent Labs    03/29/22 1031 03/29/22 1046 03/29/22 1046 03/29/22 1525 03/30/22 0444  HGB  --  12.9*   < > 13.1 13.7  HCT  --  39.7  --  39.5 41.3  PLT  --  154  --  171 164  APTT 46*  --   --   --   --   LABPROT 31.1*  --   --   --  27.4*  INR 3.0*  --   --   --  2.6*  CREATININE  --  1.02  --   --  1.26*   < > = values in this interval not displayed.     Estimated Creatinine Clearance: 37.2 mL/min (A) (by C-G formula based on SCr of 1.26 mg/dL (H)).  Diet: n/a DDI: n/a ChadsVasc2 score: 4   Home warfarin regimen:  2.5 mg (5 mg x 0.5) every Sun, Thu; 5 mg (5 mg x 1) all other days  Goal of Therapy:  INR 2-3 Monitor platelets by anticoagulation protocol: Yes   Admit INR 3.0-therapeutic Lab Results  Component Value Date   INR 2.6 (H) 03/30/2022   INR 3.0 (H) 03/29/2022   INR 3.0 03/13/2022    Assessment: Martin James a 81 y.o. male requires anticoagulation with warfarin for the indication of  atrial fibrillation. Warfarin will be initiated inpatient following pharmacy protocol per pharmacy consult. Patient most recent blood work is as follows:    Latest Ref Rng & Units 03/30/2022    4:44 AM 03/29/2022    3:25 PM 03/29/2022   10:46 AM  CBC  WBC 4.0 - 10.5 K/uL 5.9  5.3  5.2   Hemoglobin 13.0 - 17.0 g/dL 62.8  31.5  17.6   Hematocrit 39.0 - 52.0 % 41.3  39.5  39.7   Platelets 150 - 400 K/uL 164  171  154      Plan: Will continue home regimen and give warfarin 5 mg X1 tonight. F/u INR  w/AM labs tom.  Monitor INR daily. Monitor for signs and symptoms of bleeding   Luan Pulling, PharmD, [facility] Clinical Pharmacist

## 2022-03-31 DIAGNOSIS — I5023 Acute on chronic systolic (congestive) heart failure: Secondary | ICD-10-CM | POA: Diagnosis not present

## 2022-03-31 LAB — BASIC METABOLIC PANEL
Anion gap: 8 (ref 5–15)
BUN: 25 mg/dL — ABNORMAL HIGH (ref 8–23)
CO2: 26 mmol/L (ref 22–32)
Calcium: 8.5 mg/dL — ABNORMAL LOW (ref 8.9–10.3)
Chloride: 102 mmol/L (ref 98–111)
Creatinine, Ser: 1.03 mg/dL (ref 0.61–1.24)
GFR, Estimated: >60 mL/min (ref >60)
Glucose, Bld: 86 mg/dL (ref 70–99)
Potassium: 4.1 mmol/L (ref 3.5–5.1)
Sodium: 136 mmol/L (ref 135–145)

## 2022-03-31 LAB — PROTIME-INR
INR: 2.9 — ABNORMAL HIGH (ref 0.8–1.2)
Prothrombin Time: 29.7 seconds — ABNORMAL HIGH (ref 11.4–15.2)

## 2022-03-31 MED ORDER — ASPIRIN 81 MG PO TBEC: 81.0000 mg | DELAYED_RELEASE_TABLET | Freq: Every day | ORAL | 3 refills | Status: DC

## 2022-03-31 MED ORDER — DIGOXIN 125 MCG PO TABS: ORAL_TABLET | ORAL | 1 refills | Status: DC

## 2022-03-31 MED ORDER — FUROSEMIDE 20 MG PO TABS: 20.0000 mg | ORAL_TABLET | Freq: Every morning | ORAL | 3 refills | Status: DC

## 2022-03-31 MED ORDER — SPIRONOLACTONE 25 MG PO TABS: 25.0000 mg | ORAL_TABLET | Freq: Every morning | ORAL | 3 refills | Status: DC

## 2022-03-31 MED ORDER — WARFARIN SODIUM 2.5 MG PO TABS
2.5000 mg | ORAL_TABLET | Freq: Once | ORAL | Status: DC
Start: 2022-03-31 — End: 2022-03-31

## 2022-03-31 MED ORDER — METOPROLOL SUCCINATE ER 25 MG PO TB24: 25.0000 mg | ORAL_TABLET | Freq: Every day | ORAL | 3 refills | Status: DC

## 2022-03-31 NOTE — Progress Notes (Signed)
ANTICOAGULATION CONSULT NOTE - Follow Up Consult   Pharmacy Consult for warfarin dosing  Indication: atrial fibrillation   No Known Allergies    Patient Measurements: 61.5 kg, 174 cm              Temp: 97.7 F (36.5 C) (08/27 0525) Temp Source: Oral (08/27 0525) BP: 95/72 (08/27 0525) Pulse Rate: 86 (08/27 0525)  Labs: Recent Labs    03/29/22 1031 03/29/22 1046 03/29/22 1046 03/29/22 1525 03/30/22 0444 03/31/22 0433  HGB  --  12.9*   < > 13.1 13.7  --   HCT  --  39.7  --  39.5 41.3  --   PLT  --  154  --  171 164  --   APTT 46*  --   --   --   --   --   LABPROT 31.1*  --   --   --  27.4* 29.7*  INR 3.0*  --   --   --  2.6* 2.9*  CREATININE  --  1.02  --   --  1.26*  --    < > = values in this interval not displayed.     Estimated Creatinine Clearance: 37.1 mL/min (A) (by C-G formula based on SCr of 1.26 mg/dL (H)).  Diet: n/a DDI: n/a ChadsVasc2 score: 4   Home warfarin regimen:  2.5 mg (5 mg x 0.5) every Sun, Thu; 5 mg (5 mg x 1) all other days  Goal of Therapy:  INR 2-3 Monitor platelets by anticoagulation protocol: Yes   Admit INR 3.0-therapeutic Lab Results  Component Value Date   INR 2.9 (H) 03/31/2022   INR 2.6 (H) 03/30/2022   INR 3.0 (H) 03/29/2022    Assessment: Martin James a 81 y.o. male requires anticoagulation with warfarin for the indication of  atrial fibrillation. Warfarin will be initiated inpatient following pharmacy protocol per pharmacy consult. Patient most recent blood work is as follows:    Latest Ref Rng & Units 03/30/2022    4:44 AM 03/29/2022    3:25 PM 03/29/2022   10:46 AM  CBC  WBC 4.0 - 10.5 K/uL 5.9  5.3  5.2   Hemoglobin 13.0 - 17.0 g/dL 02.5  85.2  77.8   Hematocrit 39.0 - 52.0 % 41.3  39.5  39.7   Platelets 150 - 400 K/uL 164  171  154      Plan: Will continue home regimen and give warfarin 2.5 mg X1 tonight Monitor INR daily. Monitor for signs and symptoms of bleeding   Luan Pulling, PharmD, [facility] Clinical  Pharmacist

## 2022-03-31 NOTE — Care Management CC44 (Signed)
Condition Code 44 Documentation Completed  Patient Details  Name: OLUWATOBI VISSER MRN: [id number] Date of Birth: 1941/06/27   Condition Code 44 given:   yed Patient signature on Condition Code 44 notice:   telephone Documentation of 2 MD's agreement:   yes Code 44 added to claim:   yes    Deatra Robinson, LCSW 03/31/2022, 9:33 AM

## 2022-03-31 NOTE — Discharge Instructions (Signed)
1)Very low-salt diet advised--less than 2 g of sodium per day in your diet advised 2)Weigh yourself daily, call if you gain more than 2 to 3 pounds in 1 day or more than 4 to 5 pounds in 1 week as your diuretic medications may need to be adjusted 3)Limit your Fluid  intake to no more than 60 ounces (1.8 Liters) per day 4)Repeat BMP and PT/INR blood Test within a week advised with the primary care physician (Dr. Durene Cal, Aldine Contes, MD) 5) please note that there has been changes to your medications--- please take your medications as prescribed

## 2022-03-31 NOTE — Discharge Summary (Signed)
Martin James, is a 81 y.o. male  DOB February 02, 1941  MRN [id number].  Admission date:  03/29/2022  Admitting Physician  Shon Hale, MD  Discharge Date:  03/31/2022   Primary MD  [name], MD  Recommendations for primary care physician for things to follow:   1)Very low-salt diet advised--less than 2 g of sodium per day in your diet advised 2)Weigh yourself daily, call if you gain more than 2 to 3 pounds in 1 day or more than 4 to 5 pounds in 1 week as your diuretic medications may need to be adjusted 3)Limit your Fluid  intake to no more than 60 ounces (1.8 Liters) per day 4)Repeat BMP and PT/INR blood Test within a week advised with the primary care physician (Dr. Durene Cal, Aldine Contes, MD) 5) please note that there has been changes to your medications--- please take your medications as prescribed  Admission Diagnosis  Acute exacerbation of CHF (congestive heart failure) (HCC) [I50.9] Acute on chronic congestive heart failure, unspecified heart failure type (HCC) [I50.9]   Discharge Diagnosis  Acute exacerbation of CHF (congestive heart failure) (HCC) [I50.9] Acute on chronic congestive heart failure, unspecified heart failure type (HCC) [I50.9]    Principal Problem:   Acute on chronic systolic CHF/EF 30 -35% Active Problems:   Aortic valve stenosis, Moderate to severe   Chronic anticoagulation-Coumadin   Essential hypertension   Atrial fibrillation (HCC)   Secondary hypercoagulable state (HCC)   Acute exacerbation of CHF (congestive heart failure) (HCC)      Past Medical History:  Diagnosis Date   Atrial fibrillation (HCC)    CAD (coronary artery disease)    BMS x 2 distal RCA 1998; DES x 2 mid to distal RCA 2005, had occluded distal circumflex with left to left collaterals as well   Cataract    Depression    Essential hypertension    Hyperlipidemia    Insomnia    Ischemic  cardiomyopathy    Sinus congestion     Past Surgical History:  Procedure Laterality Date   APPENDECTOMY     CATARACT EXTRACTION W/PHACO Right 07/08/2013   Procedure: CATARACT EXTRACTION PHACO AND INTRAOCULAR LENS PLACEMENT (IOC);  Surgeon: Gemma Payor, MD;  Location: [facility];  Service: Ophthalmology;  Laterality: Right;  CDE 26.34   CORONARY ANGIOPLASTY     stents x4   RIGHT/LEFT HEART CATH AND CORONARY ANGIOGRAPHY N/A 07/04/2020   Procedure: RIGHT/LEFT HEART CATH AND CORONARY ANGIOGRAPHY;  Surgeon: Dolores Patty, MD;  Location: [facility];  Service: Cardiovascular;  Laterality: N/A;   ROTATOR CUFF REPAIR Right    Undescended testicle surgery       HPI  from the history and physical done on the day of admission:     Martin James  is a 81 y.o. male who is a reformed smoker with past medical history relevant for moderate to severe aortic stenosis, chronic systolic dysfunction CHF with EF of 30 to 55%, chronic atrial fibrillation with chronic anticoagulation, HTN who presents to  the ED with complaints of worsening shortness of breath, dyspnea on exertion and orthopnea over the last several days - As per patient's wife patient is not always compliant with salt restriction.. -Has increasing leg swelling -No fevers no chills no productive cough -No leg pains or pleuritic type symptoms -INR 3.0,  BNP elevated at 1057 -Chest x-ray consistent with pulmonary edema -CBC mostly unremarkable -Creatinine 1.0, sodium 134 -LFTs are not elevated     Hospital Course:   Brief Narrative:  81 y.o. male who is a reformed smoker with past medical history relevant for moderate to severe aortic stenosis, chronic systolic dysfunction CHF with EF of 30 to 55%, chronic atrial fibrillation with chronic anticoagulation, HTN admitted on 03/29/2022 with acute on chronic systolic dysfunction/exacerbation  Assessment and Plan:  1) acute on chronic systolic dysfunction CHF/HFrEF--- Echo from March 2023  with EF of 30 to 35% in the setting of moderate to severe aortic stenosis - BNP elevated at 1057 -Chest x-ray consistent with pulmonary edema -Admits to noncompliance with salt restriction PTA -Patient previously did not tolerate Entresto, tends to have soft blood pressure-at baseline -Dig level is therapeutic, continue digoxin -Blood pressure is soft making it challenging to add additional medications from a heart failure standpoint -Patient responded well to IV Lasix -Lower extremity edema and dyspnea on exertion has mostly resolved -Orthopnea is resolved -Isosorbide was discontinued and metoprolol reduced in order to allow room for diuretics given soft blood pressure -Patient agreeable with referral to advanced heart failure clinic -Please see discharge instructions   2)Moderate to Severe Aortic Stenosis--- contributory to #1 above -Echo from March 2023 showed--moderate to severe low-flow aortic stenosis with mean gradient 18 mmHg and dimensionless index 0.27. -Discontinue isosorbide -Reduce dose of metoprolol due to soft BP to give room for IV diuresis   3)H/o CAD/ischemic cardiomyopathy--BMS x 2 distal RCA 1998; DES x 2 mid to distal RCA 2005, had occluded distal circumflex with left to left collaterals as well -No chest pains or ACS type symptoms -Continue aspirin , Lipitor and Coumadin -Stop isosorbide due to soft BP -Metoprolol as above #2   4)Permanent atrial fibrillation--- CHA2DS2-VASc score is 4 -Continue Coumadin for anticoagulation -Metoprolol dose reduced as above #2 -Continue digoxin as above #1   5)Social/Ethics--- plan of care and advanced directive discussed with patient and his wife -Patient request DNR/DNI status     Dispo: The patient is from: [facility]              Anticipated d/c is to: [facility]  Discharge Condition: Stable  Follow UP   Follow-up Information     Laurey Morale, MD. Schedule an appointment as soon as possible for a visit in 2 week(s).    Specialty: Cardiology Why: Advanced heart failure clinic in [facility] information: [address] [place] [address] [place] [address] [phone]         [name], MD. Schedule an appointment as soon as possible for a visit in 1 week(s).   Specialty: Family Medicine Contact information: [address] [place] [address] [phone]         Jonelle Sidle, MD .   Specialty: Cardiology Contact information: [address] [place] [address] [phone]                 Diet and Activity recommendation:  As advised  Discharge Instructions    Discharge Instructions     AMB referral to CHF clinic  Complete by: As directed    Advanced heart failure clinic   Call MD for:  difficulty breathing, headache or visual disturbances   Complete by: As directed    Call MD for:  persistant dizziness or light-headedness   Complete by: As directed    Call MD for:  persistant nausea and vomiting   Complete by: As directed    Call MD for:  temperature >100.4   Complete by: As directed    Diet - low sodium heart healthy   Complete by: As directed    Discharge instructions   Complete by: As directed    1)Very low-salt diet advised--less than 2 g of sodium per day in your diet advised 2)Weigh yourself daily, call if you gain more than 2 to 3 pounds in 1 day or more than 4 to 5 pounds in 1 week as your diuretic medications may need to be adjusted 3)Limit your Fluid  intake to no more than 60 ounces (1.8 Liters) per day 4)Repeat BMP and PT/INR blood Test within a week advised with the primary care physician (Dr. Durene Cal, Aldine Contes, MD) 5) please note that there has been changes to your medications--- please take your medications as prescribed   Increase activity slowly   Complete by: As directed         Discharge Medications     Allergies as of 03/31/2022   No Known Allergies      Medication List     STOP taking these  medications    isosorbide mononitrate 30 MG 24 hr tablet Commonly known as: IMDUR       TAKE these medications    aspirin EC 81 MG tablet Take 1 tablet (81 mg total) by mouth daily with breakfast. Swallow whole. Start taking on: April 01, 2022 What changed:  when to take this additional instructions   atorvastatin 80 MG tablet Commonly known as: LIPITOR Take 1 tablet by mouth once daily   digoxin 0.125 MG tablet Commonly known as: LANOXIN TAKE 1 TABLET BY MOUTH ONCE DAILY What changed: See the new instructions.   furosemide 20 MG tablet Commonly known as: Lasix Take 1 tablet (20 mg total) by mouth every morning. What changed: when to take this   hydrOXYzine 10 MG tablet Commonly known as: ATARAX Take 1 tablet (10 mg total) by mouth every evening. For sedation, ok to take 2 tabs if needed to help fall asleep.   loratadine 10 MG tablet Commonly known as: CLARITIN Take 10 mg by mouth daily as needed for allergies.   metoprolol succinate 25 MG 24 hr tablet Commonly known as: Toprol XL Take 1 tablet (25 mg total) by mouth daily. What changed:  medication strength how much to take when to take this   spironolactone 25 MG tablet Commonly known as: Aldactone Take 1 tablet (25 mg total) by mouth every morning.   warfarin 5 MG tablet Commonly known as: COUMADIN Take as directed. If you are unsure how to take this medication, talk to your nurse or doctor. Original instructions: TAKE ONE TABLET BY MOUTH DAILY EXCEPT ONE-HALF TABLET ON SUNDAY AND THURSDAY OR AS DIRECTED       Major procedures and Radiology Reports - PLEASE review detailed and final reports for all details, in brief -   DG Chest 2 View  Result Date: 03/29/2022 CLINICAL DATA:  Shortness of breath EXAM: CHEST - 2 VIEW COMPARISON:  05/17/2020 FINDINGS: Normal heart size, mediastinal contours, and pulmonary vascularity. Calcified lymph node at  [facility] window. Increased interstitial infiltrates. No segmental  consolidation or pneumothorax. Trace RIGHT pleural effusion. Atherosclerotic calcifications aorta. Bones demineralized. IMPRESSION: Scattered interstitial infiltrates favoring pulmonary edema. No segmental consolidation. Aortic Atherosclerosis (ICD10-I70.0). Electronically Signed   By: Ulyses Southward M.D.   On: 03/29/2022 11:26    Micro Results   Today   Subjective    Martin James today has no new complaints -Voiding well -Ambulating without significant dyspnea on exertion or hypoxia -No chest pains no palpitations no dizziness          Patient has been seen and examined prior to discharge   Objective   Blood pressure 95/72, pulse 86, temperature 97.7 F (36.5 C), temperature source Oral, resp. rate 18, height 5' 8.5" (1.74 m), weight 58.1 kg, SpO2 97 %.   Intake/Output Summary (Last 24 hours) at 03/31/2022 1255 Last data filed at 03/31/2022 1039 Gross per 24 hour  Intake 1680 ml  Output 350 ml  Net 1330 ml   Exam Gen:- Awake Alert, no acute distress  HEENT:- [place].AT, No sclera icterus Neck-Supple Neck,No JVD,.  Lungs-much improved air movement, no rales or wheezing  CV- S1, S2 normal, regular, 3/6 SM Abd-  +ve B.Sounds, Abd Soft, No tenderness,    Extremity/Skin:-Mostly resolved lower extremity edema,   good pulses Psych-affect is appropriate, oriented x3 Neuro-no new focal deficits, no tremors motor   Data Review   CBC w Diff:  Lab Results  Component Value Date   WBC 5.9 03/30/2022   HGB 13.7 03/30/2022   HCT 41.3 03/30/2022   PLT 164 03/30/2022   LYMPHOPCT 16 03/29/2022   MONOPCT 10 03/29/2022   EOSPCT 7 03/29/2022   BASOPCT 2 03/29/2022    CMP:  Lab Results  Component Value Date   NA 136 03/31/2022   K 4.1 03/31/2022   CL 102 03/31/2022   CO2 26 03/31/2022   BUN 25 (H) 03/31/2022   CREATININE 1.03 03/31/2022   CREATININE 1.04 07/19/2020   PROT 6.9 03/29/2022   ALBUMIN 3.8 03/29/2022   BILITOT 1.2 03/29/2022   ALKPHOS 47 03/29/2022   AST 32 03/29/2022    ALT 22 03/29/2022  .  Total Discharge time is about 33 minutes  Shon Hale M.D on 03/31/2022 at 12:55 PM  Go to [email or web address] -  for contact info  [facility] - Office  [phone]

## 2022-03-31 NOTE — Care Management Obs Status (Signed)
MEDICARE OBSERVATION STATUS NOTIFICATION   Patient Details  Name: Martin James MRN: [id number] Date of Birth: [birth year]   Medicare Observation Status Notification Given:   yes    Deatra Robinson, [place] 03/31/2022, 9:33 AM

## 2022-04-02 ENCOUNTER — Ambulatory Visit (INDEPENDENT_AMBULATORY_CARE_PROVIDER_SITE_OTHER): Payer: Medicare Other | Admitting: Family Medicine

## 2022-04-02 ENCOUNTER — Encounter: Payer: Self-pay | Admitting: Family Medicine

## 2022-04-02 VITALS — BP 110/70 | HR 70 | Temp 97.8°F | Ht 68.5 in | Wt 128.2 lb

## 2022-04-02 DIAGNOSIS — I4891 Unspecified atrial fibrillation: Secondary | ICD-10-CM

## 2022-04-02 DIAGNOSIS — I5023 Acute on chronic systolic (congestive) heart failure: Secondary | ICD-10-CM | POA: Diagnosis not present

## 2022-04-02 DIAGNOSIS — I700 Atherosclerosis of aorta: Secondary | ICD-10-CM | POA: Diagnosis not present

## 2022-04-02 DIAGNOSIS — I25118 Atherosclerotic heart disease of native coronary artery with other forms of angina pectoris: Secondary | ICD-10-CM | POA: Diagnosis not present

## 2022-04-02 DIAGNOSIS — R911 Solitary pulmonary nodule: Secondary | ICD-10-CM | POA: Diagnosis not present

## 2022-04-02 NOTE — Progress Notes (Signed)
Phone [phone] In person visit   Subjective:   Martin James is a 81 y.o. year old very pleasant male patient who presents for/with See problem oriented charting Chief Complaint  Patient presents with   Follow-up    Pt is here to f/u on hospital visit, pt states he is feeling a lot better.    Past Medical History-  Patient Active Problem List   Diagnosis Date Noted   Acute on chronic systolic CHF/EF 30 -35% 03/29/2022    Priority: High   Aortic valve stenosis, Moderate to severe 03/29/2022    Priority: High   Atrial fibrillation (HCC) 09/29/2018    Priority: High   CAD (coronary artery disease) 02/17/2007    Priority: High   Aortic atherosclerosis (HCC) 12/17/2021    Priority: Medium    Binocular diplopia 12/31/2016    Priority: Medium    Hyperlipidemia 02/17/2007    Priority: Medium    Essential hypertension 02/17/2007    Priority: Medium    Chronic anticoagulation-Coumadin 03/29/2022    Priority: Low   Secondary hypercoagulable state (HCC) 07/20/2021    Priority: Low   Pulmonary nodule 11/12/2019    Priority: Low   Encounter for therapeutic drug monitoring 09/29/2018    Priority: Low   Chronic sinusitis 07/10/2017    Priority: Low   Headache 05/24/2014    Priority: Low   INSOMNIA, PERSISTENT 03/03/2007    Priority: Low   Depression 03/03/2007    Priority: Low   SYNDROME, CARPAL TUNNEL 02/17/2007    Priority: Low   Fatty liver 02/17/2007    Priority: Low   Right shoulder pain 09/02/2014    Priority: 1.    Medications- reviewed and updated Current Outpatient Medications  Medication Sig Dispense Refill   aspirin EC 81 MG tablet Take 1 tablet (81 mg total) by mouth daily with breakfast. Swallow whole. 120 tablet 3   atorvastatin (LIPITOR) 80 MG tablet Take 1 tablet by mouth once daily 90 tablet 0   digoxin (LANOXIN) 0.125 MG tablet TAKE 1 TABLET BY MOUTH ONCE DAILY 90 tablet 1   furosemide (LASIX) 20 MG tablet Take 1 tablet (20 mg total) by mouth every  morning. 90 tablet 3   hydrOXYzine (ATARAX) 10 MG tablet Take 1 tablet (10 mg total) by mouth every evening. For sedation, ok to take 2 tabs if needed to help fall asleep. 30 tablet 1   loratadine (CLARITIN) 10 MG tablet Take 10 mg by mouth daily as needed for allergies.     metoprolol succinate (TOPROL XL) 25 MG 24 hr tablet Take 1 tablet (25 mg total) by mouth daily. 90 tablet 3   spironolactone (ALDACTONE) 25 MG tablet Take 1 tablet (25 mg total) by mouth every morning. 90 tablet 3   warfarin (COUMADIN) 5 MG tablet TAKE ONE TABLET BY MOUTH DAILY EXCEPT ONE-HALF TABLET ON SUNDAY AND THURSDAY OR AS DIRECTED 75 tablet 1   No current facility-administered medications for this visit.     Objective:  BP 110/70   Pulse 70   Temp 97.8 F (36.6 C)   Ht 5' 8.5" (1.74 m)   Wt 128 lb 3.2 oz (58.2 kg)   BMI 19.21 kg/m  Gen: NAD, resting comfortably CV: RRR no murmurs rubs or gallops Lungs: CTAB no crackles, wheeze, rhonchi Ext: no edema on the right, minimal on the left ankle Skin: warm, dry     Assessment and Plan   #Heart failure with reduced ejection fraction (echocardiogram 2023 EF 30 to  35% on with moderate to severe aortic stenosis)- hospital follow up  S: Medication:digoxin 0.125 mg daily, lasix 20 mg, spironolactone 25 mg (newly started), metoprolol 25 mg XR (recently reduced along with Imdur being eliminated to give space for blood pressure) -Has not tolerated Entresto -Last visit in early August wanted to hold off on starting Jardiance with his Dr. Diona Browner of cardiology  Hospital course: Patient presented with worsening shortness of breath and orthopnea-found to have elevated BNP to 1057 and chest x-ray consistent with pulmonary edema in setting of poor compliance with salt restriction.  Patient responded well to IV diuresis with Lasix.  Imdur was discontinued as above to make space for adjusting heart failure medications.  Patient was referred to advanced heart failure clinic. -  Moderate to severe aortic stenosis noted and likely contributory to hospitalization-we briefly discussed procedures like TAVR but not sure if he is a candidate or if they would recommend at this juncture-he is going to discuss further with cardiology  Patient report on pre and posthospitalization: Patient reports on Thursday ankles were normal and by Friday extremely swollen and very short of breath. He thought at first could be anxiety at first- for 2 weeks prior was having shortness of breath with lying down-we discussed how this was likely orthopnea  Edema: Much improved Weight gain: None noted-actually down 7 pounds Shortness of breath: Denies but still has some fatigue-walking down from 10 miles a day to 5 miles a day which is tough on him Orthopnea/PND: Actually noted previously to the hospital but has now resolved Lifestyle adjustments: Since getting home restricting salt and sodium intake. T A/P: Heart failure with reduced ejection fraction exacerbation leading to hospitalization-much improved with IV diuresis and now ongoing Lasix 20 mg plus spironolactone 25 mg recently added.  Blood pressure seems to be tolerating this thankfully (has been an issue in the past getting too low).  He will also continue metoprolol at 25 mg extended release-lower dose-as well as digoxin at present dose - Continue salt restriction, fluid restriction in addition to the above -Just discharged from hospital 2 days ago-considered BMP today to recheck potassium but without a better interval might be next week with heart failure clinic or he can try to get done at any pain or come back to our clinic for this-printed a order for this -In regards to aortic stenosis-continue close monitoring with cardiology-briefly discussed TAVR but also not sure if he is a candidate-that would be determined by cardiology  # Atrial fibrillation S: Rate controlled with metoprololol 25 mg XR Anticoagulated with coumadin A/P:  Appropriately anticoagulated and rate controlled-continue current medication  #CAD  #hyperlipidemia #aortic atherosclerosis S: Medication: Atorvastatin 80 mg, aspirin 81 mg -Denies chest pain.  Shortness of breath is actually improved with diuresis despite stopping Imdur Lab Results  Component Value Date   CHOL 117 07/20/2021   HDL 45.00 07/20/2021   LDLCALC 60 07/20/2021   LDLDIRECT 60.0 07/10/2017   TRIG 64.0 07/20/2021   CHOLHDL 3 07/20/2021   A/P: CAD asymptomatic-continue current medication  Recommended follow up: Return for next already scheduled visit or sooner if needed. Future Appointments  Date Time Provider Department Center  04/09/2022 12:00 PM [facility] None  04/15/2022  3:35 PM Manson Passey, PA [facility]  04/24/2022  9:00 AM [facility]  07/24/2022 10:00 AM [name], MD [facility]  10/01/2022  8:20 AM Jonelle Sidle, MD [facility]    Lab/Order associations:  ICD-10-CM   1. Acute on chronic systolic CHF/EF 30 -[id number]  [id number] Basic metabolic panel    2. Aortic atherosclerosis (HCC) Chronic I70.0     3. Coronary artery disease of native artery of native heart with stable angina pectoris ([place])  I25.118     4. Atrial fibrillation, unspecified type ([place])  I48.91     5. Pulmonary nodule  R91.1       No orders of the defined types were placed in this encounter.   Return precautions advised.  Garret Reddish, MD

## 2022-04-02 NOTE — Patient Instructions (Addendum)
Flu shot (high dose)- we should have these available within a month or two but please let us know if you get at outside pharmacy  Try to get BMP early next week- if [facility] will not do this for you- you can come back here (call for lab visit)- can also ask heart failure clinic  Continue to monitor weight daily and let us know if any increase more than 3 lbs in a day or 5 lbs in a week, monitor for swelling as well. Continue lasix and spironolactone. If any worsening symptoms let us or cardiology know asap  Recommended follow up: Return for next already scheduled visit or sooner if needed.

## 2022-04-09 ENCOUNTER — Telehealth (HOSPITAL_COMMUNITY): Payer: Self-pay | Admitting: *Deleted

## 2022-04-09 ENCOUNTER — Other Ambulatory Visit (HOSPITAL_COMMUNITY): Payer: Self-pay

## 2022-04-09 ENCOUNTER — Ambulatory Visit (HOSPITAL_COMMUNITY)
Admission: RE | Admit: 2022-04-09 | Discharge: 2022-04-09 | Disposition: A | Discharge status: Home / Self Care | Payer: Medicare Other | Source: Ambulatory Visit | Attending: Adult Health | Admitting: Adult Health

## 2022-04-09 ENCOUNTER — Telehealth (HOSPITAL_COMMUNITY): Payer: Self-pay

## 2022-04-09 ENCOUNTER — Encounter (HOSPITAL_COMMUNITY): Payer: Self-pay

## 2022-04-09 ENCOUNTER — Encounter: Payer: Self-pay | Admitting: Family Medicine

## 2022-04-09 VITALS — BP 110/78 | HR 77 | Wt 125.4 lb

## 2022-04-09 DIAGNOSIS — I4821 Permanent atrial fibrillation: Secondary | ICD-10-CM | POA: Diagnosis not present

## 2022-04-09 DIAGNOSIS — I350 Nonrheumatic aortic (valve) stenosis: Secondary | ICD-10-CM

## 2022-04-09 DIAGNOSIS — I5022 Chronic systolic (congestive) heart failure: Secondary | ICD-10-CM | POA: Insufficient documentation

## 2022-04-09 DIAGNOSIS — I2510 Atherosclerotic heart disease of native coronary artery without angina pectoris: Secondary | ICD-10-CM | POA: Diagnosis not present

## 2022-04-09 DIAGNOSIS — Z87891 Personal history of nicotine dependence: Secondary | ICD-10-CM | POA: Diagnosis not present

## 2022-04-09 DIAGNOSIS — I110 Hypertensive heart disease with heart failure: Secondary | ICD-10-CM | POA: Insufficient documentation

## 2022-04-09 DIAGNOSIS — I255 Ischemic cardiomyopathy: Secondary | ICD-10-CM

## 2022-04-09 DIAGNOSIS — I5020 Unspecified systolic (congestive) heart failure: Secondary | ICD-10-CM | POA: Diagnosis not present

## 2022-04-09 DIAGNOSIS — Z955 Presence of coronary angioplasty implant and graft: Secondary | ICD-10-CM | POA: Diagnosis not present

## 2022-04-09 DIAGNOSIS — Z7901 Long term (current) use of anticoagulants: Secondary | ICD-10-CM | POA: Insufficient documentation

## 2022-04-09 DIAGNOSIS — Z823 Family history of stroke: Secondary | ICD-10-CM | POA: Diagnosis not present

## 2022-04-09 DIAGNOSIS — Z79899 Other long term (current) drug therapy: Secondary | ICD-10-CM | POA: Diagnosis not present

## 2022-04-09 LAB — CBC
HCT: 41.1 % (ref 39.0–52.0)
Hemoglobin: 13.9 g/dL (ref 13.0–17.0)
MCH: 32.5 pg (ref 26.0–34.0)
MCHC: 33.8 g/dL (ref 30.0–36.0)
MCV: 96 fL (ref 80.0–100.0)
Platelets: 183 10*3/uL (ref 150–400)
RBC: 4.28 MIL/uL (ref 4.22–5.81)
RDW: 13.5 % (ref 11.5–15.5)
WBC: 5.5 10*3/uL (ref 4.0–10.5)
nRBC: 0 % (ref 0.0–0.2)

## 2022-04-09 LAB — BASIC METABOLIC PANEL
Anion gap: 7 (ref 5–15)
BUN: 22 mg/dL (ref 8–23)
CO2: 26 mmol/L (ref 22–32)
Calcium: 9.5 mg/dL (ref 8.9–10.3)
Chloride: 103 mmol/L (ref 98–111)
Creatinine, Ser: 1.13 mg/dL (ref 0.61–1.24)
GFR, Estimated: >60 mL/min (ref >60)
Glucose, Bld: 108 mg/dL — ABNORMAL HIGH (ref 70–99)
Potassium: 5 mmol/L (ref 3.5–5.1)
Sodium: 136 mmol/L (ref 135–145)

## 2022-04-09 LAB — BRAIN NATRIURETIC PEPTIDE: B Natriuretic Peptide: 723 pg/mL — ABNORMAL HIGH (ref 0.0–100.0)

## 2022-04-09 MED ORDER — FUROSEMIDE 20 MG PO TABS: 20.0000 mg | ORAL_TABLET | ORAL | 3 refills | Status: DC | PRN

## 2022-04-09 MED ORDER — DAPAGLIFLOZIN PROPANEDIOL 10 MG PO TABS: 10.0000 mg | ORAL_TABLET | Freq: Every day | ORAL | 3 refills | Status: DC

## 2022-04-09 NOTE — Telephone Encounter (Signed)
Contacted patient to inform him on copay for Farxiga. No answer but LVM. He was provided with free 30-day card in clinic. However, if copays are too expensive moving forward, advised to contact the clinic at [phone] to discuss assistance options for him.

## 2022-04-09 NOTE — Progress Notes (Signed)
HEART & VASCULAR TRANSITION OF CARE CONSULT NOTE     Referring Physician: Dr Wyline Mood lasix 20 mg daily.  Primary Care: Dr Hunger  Primary Cardiologist: Dr Diona Browner  HPI: Referred to clinic by Dr Mariea Clonts  for heart failure consultation.   Martin James is a 81 year old with a history of HFrEF, ICM, CAD s/p BMS x2 distal RCA 1998; DES x2 mid to distal RCA 2005, occluded distal Lcx with L-L collaterals, permament atrial fibrillation on coumadin, HTN,  and depression.   Followed closely by Dr Diona Browner. Intolerant entresto due to hypotension. AS he requested conservative management.   Walking 10 miles a day until 1 month ago. In July he went to [place] and was eating out at least once a day. After he returned from vacation he gradually developed orthopnea and fatigue. His wife convinced him to go to the ED.    Admitted to [facility] 03/29/22 with A/C HFrEF. BNP 1057. He had been complaint with medications. Diuresed with IV lasix and transitioned to lasix 20 mg daily. Toprol XL cut back. Discharge weight 128 pounds.   Overall feeling fine. Walking 5 miles a day. Denies SOB/PND/Orthopnea. Appetite ok. No fever or chills. Weight at home 122 pounds. Taking all medications  Cardiac Testing  Echo 10/2021 -LVEF 30-35%. Aortic stenosis moderate to severe. Mean gradient 18 mmhg  Echo 2021 LVEF 20-25% RV mildly reduced LA severely dilated, RA moderately dilated, mild-mod AS, mean gradient 11.5 mmhg.   Echo 2020 EF 35-40% . Mild AS, LA severely dilated.   Cath 2021 Mid RCA to Dist RCA lesion is 100% stenosed. Mid Cx to Dist Cx lesion is 99% stenosed. Prox LAD to Mid LAD lesion is 40% stenosed. Prox Cx to Mid Cx lesion is 30% stenosed.  Ao = 109/60 LV = 113/5 RA =  1 RV = 25/1 PA = 24/7 (14) PCW = 7 Fick cardiac output/index = 4.7/2.6 PVR = 1.5 WU FA sat = 99% PA sat = 71%, 76%  Assessment: 1.  2v CAD with total occlusion of mRCA with good L -> R collaterals and high grade lesion in distal AV-groove  LCX with L->L collaterals 2. EF 20% 3. Low filling pressures with normal cardiac output     Review of Systems: [y] = yes, [ ]  = no   General: Weight gain [ ] ; Weight loss [ ] ; Anorexia [ ] ; Fatigue [ ] ; Fever [ ] ; Chills [ ] ; Weakness [ ]   Cardiac: Chest pain/pressure [ ] ; Resting SOB [ ] ; Exertional SOB [ ] ; Orthopnea [ ] ; Pedal Edema [ ] ; Palpitations [ ] ; Syncope [ ] ; Presyncope [ ] ; Paroxysmal nocturnal dyspnea[ ]   Pulmonary: Cough [ ] ; Wheezing[ ] ; Hemoptysis[ ] ; Sputum [ ] ; Snoring [ ]   GI: Vomiting[ ] ; Dysphagia[ ] ; Melena[ ] ; Hematochezia [ ] ; Heartburn[ ] ; Abdominal pain [ ] ; Constipation [ ] ; Diarrhea [ ] ; BRBPR [ ]   GU: Hematuria[ ] ; Dysuria [ ] ; Nocturia[ ]   Vascular: Pain in legs with walking [ ] ; Pain in feet with lying flat [ ] ; Non-healing sores [ ] ; Stroke [ ] ; TIA [ ] ; Slurred speech [ ] ;  Neuro: Headaches[ ] ; Vertigo[ ] ; Seizures[ ] ; Paresthesias[ ] ;Blurred vision [ ] ; Diplopia [ ] ; Vision changes [ ]   Ortho/Skin: Arthritis [ ] ; Joint pain [ Y]; Muscle pain [ ] ; Joint swelling [ ] ; Back Pain [Y ]; Rash [ ]   Psych: Depression[ ] ; Anxiety[ ]   Heme: Bleeding problems [ ] ; Clotting disorders [ ] ; Anemia [ ]   Endocrine: Diabetes [ ] ; Thyroid dysfunction[ ]    Past Medical History:  Diagnosis Date   Atrial fibrillation (HCC)    CAD (coronary artery disease)    BMS x 2 distal RCA 1998; DES x 2 mid to distal RCA 2005, had occluded distal circumflex with left to left collaterals as well   Cataract    Depression    Essential hypertension    Hyperlipidemia    Insomnia    Ischemic cardiomyopathy    Sinus congestion     Current Outpatient Medications  Medication Sig Dispense Refill   aspirin EC 81 MG tablet Take 1 tablet (81 mg total) by mouth daily with breakfast. Swallow whole. 120 tablet 3   atorvastatin (LIPITOR) 80 MG tablet Take 1 tablet by mouth once daily 90 tablet 0   digoxin (LANOXIN) 0.125 MG tablet TAKE 1 TABLET BY MOUTH ONCE DAILY 90 tablet 1   furosemide  (LASIX) 20 MG tablet Take 1 tablet (20 mg total) by mouth every morning. 90 tablet 3   hydrOXYzine (ATARAX) 10 MG tablet Take 1 tablet (10 mg total) by mouth every evening. For sedation, ok to take 2 tabs if needed to help fall asleep. 30 tablet 1   loratadine (CLARITIN) 10 MG tablet Take 10 mg by mouth daily as needed for allergies.     metoprolol succinate (TOPROL XL) 25 MG 24 hr tablet Take 1 tablet (25 mg total) by mouth daily. 90 tablet 3   spironolactone (ALDACTONE) 25 MG tablet Take 1 tablet (25 mg total) by mouth every morning. 90 tablet 3   warfarin (COUMADIN) 5 MG tablet TAKE ONE TABLET BY MOUTH DAILY EXCEPT ONE-HALF TABLET ON SUNDAY AND THURSDAY OR AS DIRECTED 75 tablet 1   No current facility-administered medications for this encounter.    No Known Allergies    Social History   Socioeconomic History   Marital status: Married    Spouse name: Martin James   Number of children: 2   Years of education: Not on file   Highest education level: Bachelor's degree (e.g., BA, AB, BS)  Occupational History   Occupation: retired    Comment: [employer]  Tobacco Use   Smoking status: Former    Packs/day: 1.00    Years: 30.00    Total pack years: 30.00    Types: Cigarettes    Quit date: 08/05/1990    Years since quitting: 31.6   Smokeless tobacco: Never  Vaping Use   Vaping Use: Never used  Substance and Sexual Activity   Alcohol use: Yes    Alcohol/week: 2.0 standard drinks of alcohol    Types: 1 Glasses of wine, 1 Cans of beer per week    Comment: occassional   Drug use: No   Sexual activity: Not on file  Other Topics Concern   Not on file  Social History Narrative   Married 1964. 2 kids-son ([place]- in between businesses in 2021 looking at retring), daughter ([place]- had guillain barre syndrome). 5 grandkids.       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      Hobbies: walk 8 miles a day, neighborhood [place]   Social Determinants of Health   Financial Resource Strain:  Low Risk  (04/09/2022)   Overall Financial Resource Strain (CARDIA)    Difficulty of Paying Living Expenses: Not hard at all  Food Insecurity: No Food Insecurity (04/09/2022)   Hunger Vital Sign    Worried About Running Out of Food in the Last Year: Never true  Ran Out of Food in the Last Year: Never true  Transportation Needs: No Transportation Needs (04/09/2022)   PRAPARE - Hydrologist (Medical): No    Lack of Transportation (Non-Medical): No  Physical Activity: Not on file  Stress: Not on file  Social Connections: Not on file  Intimate Partner Violence: Not on file      Family History  Problem Relation Age of Onset   Diabetes Father    Stroke Father    Colon cancer Neg Hx    Rectal cancer Neg Hx    Stomach cancer Neg Hx     Vitals:   04/09/22 1209  BP: 110/78  Pulse: 77  SpO2: (!) 89%  Weight: 56.9 kg (125 lb 6.4 oz)   Wt Readings from Last 3 Encounters:  04/09/22 56.9 kg (125 lb 6.4 oz)  04/02/22 58.2 kg (128 lb 3.2 oz)  03/31/22 58.1 kg (128 lb 1.4 oz)    PHYSICAL EXAM: General: Thin. No respiratory difficulty HEENT: normal Neck: supple. no JVD. Carotids 2+ bilat; no bruits. No lymphadenopathy or thryomegaly appreciated. Cor: PMI nondisplaced. Irregular rate & rhythm. No rubs, gallops. RUSB 2/6 . Lungs: clear Abdomen: soft, nontender, nondistended. No hepatosplenomegaly. No bruits or masses. Good bowel sounds. Extremities: no cyanosis, clubbing, rash, edema Neuro: alert & oriented x 3, cranial nerves grossly intact. moves all 4 extremities w/o difficulty. Affect pleasant.   ASSESSMENT & PLAN: 1. Chronic HFrEF, mixed ICM/Valvular Disease -10/2021 Echo EF Ef 30-35% with mod-severe AS. EF was a little better but AS concerning. ? Amyloid. Check myeloma panel.  Cath 2021-2v CAD with total occlusion of mRCA with good L -> R collaterals and high grade lesion in distal AV-groove LCX with L->L collaterals  Had an admit last month for A/C  HFrEF. ? Diet change.  NYHA II GDMT - He does not appear volume overloaded.  Diuretic-Stop lasix and change to lasix as needed.  BB-Continue current dose to Toprol XL.  Ace/ARB/ARNI- Failed entresto due to hypotension.  MRA- Continue current dose of spiro.  SGLT2i- Add farxiga 10 mg daily.  Discussed purpose of farxiga. Given 30 day free card.  - Check BMET/BNP - Repeat ECHO next visit.   2. Aortic Stenosis  10/2021 Low flow low gradient mod-severe AS. Mean gradient 18 mmhg -Repeat ECHO next visit.  - Check myeloma panel. Consider PYP scan.  - Consider referral to Structural Heart Team  3. CAD -Cath- 2v CAD with total occlusion of mRCA with good L -> R collaterals and high grade lesion in distal AV-groove LCX with L->L collaterals -On statin, asa, bb, imdur  -No chest pain.   4. A fib , permanent -Rate controlled on bb and dig.  -On coumadin.    Referred to HFSW (PCP, Medications, Transportation, ETOH Abuse, Drug Abuse, Insurance, Financial ): No Refer to Pharmacy:  No Refer to Home Health:  No Refer to Advanced Heart Failure Clinic: Yes or no  Refer to General Cardiology: Yes or No  Follow up in 4 weeks with Dr Haroldine Laws and an Echo.   Martin Mangino NP-C  1:09 PM

## 2022-04-09 NOTE — Telephone Encounter (Signed)
Called to confirm Heart & Vascular Transitions of Care appointment at 12 noon on 04/09/22. Patient reminded to bring all medications and pill box organizer with them. Confirmed patient has transportation. Gave directions, instructed to utilize valet parking.  Confirmed appointment prior to ending call.    Rhae Hammock, BSN, Scientist, clinical (histocompatibility and immunogenetics) Only

## 2022-04-09 NOTE — Patient Instructions (Signed)
Medication Changes:  START: Martin James, Take 1 tablet daily.   CHANGE: lasix to as needed for leg swelling.    Lab Work:  Labs done today, your results will be available in MyChart, we will contact you for abnormal readings.  Testing/Procedures:  Your physician has requested that you have an echocardiogram. Echocardiography is a painless test that uses sound waves to create images of your heart. It provides your doctor with information about the size and shape of your heart and how well your heart's chambers and valves are working. This procedure takes approximately one hour. There are no restrictions for this procedure.  Referrals:  You have been referred to the Advanced Heart Failure Clinic. See below for follow up.   Special Instructions // Education:  Do the following things EVERYDAY: Weigh yourself in the morning before breakfast. Write it down and keep it in a log. Take your medicines as prescribed Eat low salt foods--Limit salt (sodium) to 2000 mg per day.  Stay as active as you can everyday Limit all fluids for the day to less than 2 liters   Follow-Up:  October 5 @ 1:55pm for ECHO, THEN appt with Martin. Gala Romney to follow at 3:20pm     At the Advanced [facility] Failure Clinic, you and your health needs are our priority. We have a designated team specialized in the treatment of Heart Failure. This Care Team includes your primary Heart Failure Specialized Cardiologist (physician), Advanced Practice Providers (APPs- Physician Assistants and Nurse Practitioners), and Pharmacist who all work together to provide you with the care you need, when you need it.   You may see any of the following providers on your designated Care Team at your next follow up:  Martin James Martin Carron James, Martin James, Martin [facility] James, Martin Karle Plumber, PharmD   Please be sure to bring in all your medications bottles to every appointment.   Need to  Contact us:  If you have any questions or concerns before your next appointment please send [place] a message through [place] or call our office at [phone].    TO LEAVE A MESSAGE FOR THE NURSE SELECT OPTION 2, PLEASE LEAVE A MESSAGE INCLUDING: YOUR NAME DATE OF BIRTH CALL BACK NUMBER REASON FOR CALL**this is important as we prioritize the call backs  YOU WILL RECEIVE A CALL BACK THE SAME DAY AS LONG AS YOU CALL BEFORE 4:00 PM

## 2022-04-10 ENCOUNTER — Other Ambulatory Visit: Payer: Self-pay

## 2022-04-10 DIAGNOSIS — E875 Hyperkalemia: Secondary | ICD-10-CM

## 2022-04-12 ENCOUNTER — Other Ambulatory Visit: Payer: Self-pay | Admitting: Family Medicine

## 2022-04-15 ENCOUNTER — Ambulatory Visit: Payer: Medicare Other | Admitting: Physician Assistant

## 2022-04-15 LAB — MULTIPLE MYELOMA PANEL, SERUM
Albumin SerPl Elph-Mcnc: 3.9 g/dL (ref 2.9–4.4)
Albumin/Glob SerPl: 1.4 (ref 0.7–1.7)
Alpha 1: 0.2 g/dL (ref 0.0–0.4)
Alpha2 Glob SerPl Elph-Mcnc: 0.6 g/dL (ref 0.4–1.0)
B-Globulin SerPl Elph-Mcnc: 0.9 g/dL (ref 0.7–1.3)
Gamma Glob SerPl Elph-Mcnc: 1.2 g/dL (ref 0.4–1.8)
Globulin, Total: 2.9 g/dL (ref 2.2–3.9)
IgA: 207 mg/dL (ref 61–437)
IgG (Immunoglobin G), Serum: 1291 mg/dL (ref 603–1613)
IgM (Immunoglobulin M), Srm: 74 mg/dL (ref 15–143)
Total Protein ELP: 6.8 g/dL (ref 6.0–8.5)

## 2022-04-15 NOTE — Progress Notes (Deleted)
Cardiology Office Note:    Date:  04/15/2022   ID:  Martin James, DOB 1941/03/19, MRN [id number]  PCP:  [name], MD  [facility] Cardiologist:  Nona Dell, MD  [facility] Electrophysiologist:  None   Chief Complaint:   History of Present Illness:    Martin James is a 81 y.o. male with a hx of ***  Past Medical History:  Diagnosis Date   Atrial fibrillation (HCC)    CAD (coronary artery disease)    BMS x 2 distal RCA 1998; DES x 2 mid to distal RCA 2005, had occluded distal circumflex with left to left collaterals as well   Cataract    Depression    Essential hypertension    Hyperlipidemia    Insomnia    Ischemic cardiomyopathy    Sinus congestion     Past Surgical History:  Procedure Laterality Date   APPENDECTOMY     CATARACT EXTRACTION W/PHACO Right 07/08/2013   Procedure: CATARACT EXTRACTION PHACO AND INTRAOCULAR LENS PLACEMENT (IOC);  Surgeon: Gemma Payor, MD;  Location: [facility];  Service: Ophthalmology;  Laterality: Right;  CDE 26.34   CORONARY ANGIOPLASTY     stents x4   RIGHT/LEFT HEART CATH AND CORONARY ANGIOGRAPHY N/A 07/04/2020   Procedure: RIGHT/LEFT HEART CATH AND CORONARY ANGIOGRAPHY;  Surgeon: Dolores Patty, MD;  Location: [facility] INVASIVE CV LAB;  Service: Cardiovascular;  Laterality: N/A;   ROTATOR CUFF REPAIR Right    Undescended testicle surgery      Current Medications: No outpatient medications have been marked as taking for the 04/15/22 encounter (Appointment) with Manson Passey, PA.     Allergies:   Patient has no known allergies.   Social History   Socioeconomic History   Marital status: Married    Spouse name: Gamal Todisco   Number of children: 2   Years of education: Not on file   Highest education level: Bachelor's degree (e.g., BA, AB, BS)  Occupational History   Occupation: retired    Comment: Dentist  Tobacco Use   Smoking status: Former    Packs/day: 1.00    Years: 30.00    Total  pack years: 30.00    Types: Cigarettes    Quit date: 08/05/1990    Years since quitting: 31.7   Smokeless tobacco: Never  Vaping Use   Vaping Use: Never used  Substance and Sexual Activity   Alcohol use: Yes    Alcohol/week: 2.0 standard drinks of alcohol    Types: 1 Glasses of wine, 1 Cans of beer per week    Comment: occassional   Drug use: No   Sexual activity: Not on file  Other Topics Concern   Not on file  Social History Narrative   Married 1964. 2 kids-son ([place]- in between businesses in 2021 looking at retring), daughter ([place]- had guillain barre syndrome). 5 grandkids.       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      Hobbies: walk 8 miles a day, neighborhood [place]   Social Determinants of Health   Financial Resource Strain: Low Risk  (04/09/2022)   Overall Financial Resource Strain (CARDIA)    Difficulty of Paying Living Expenses: Not hard at all  Food Insecurity: No Food Insecurity (04/09/2022)   Hunger Vital Sign    Worried About Running Out of Food in the Last Year: Never true    Ran Out of Food in the Last Year: Never true  Transportation Needs: No Transportation Needs (04/09/2022)   PRAPARE -  Administrator, Civil Service (Medical): No    Lack of Transportation (Non-Medical): No  Physical Activity: Not on file  Stress: Not on file  Social Connections: Not on file     Family History: The patient's family history includes Diabetes in his father; Stroke in his father. There is no history of Colon cancer, Rectal cancer, or Stomach cancer.  ***  ROS:   Please see the history of present illness.    All other systems reviewed and are negative. ***  EKGs/Labs/Other Studies Reviewed:    The following studies were reviewed today: ***  EKG:  EKG is *** ordered today.  The ekg ordered today demonstrates ***  Recent Labs: 07/20/2021: TSH 1.12 03/29/2022: ALT 22 04/09/2022: B Natriuretic Peptide 723.0; BUN 22; Creatinine, Ser 1.13; Hemoglobin 13.9; Platelets 183;  Potassium 5.0; Sodium 136  Recent Lipid Panel    Component Value Date/Time   CHOL 117 07/20/2021 0838   TRIG 64.0 07/20/2021 0838   HDL 45.00 07/20/2021 0838   CHOLHDL 3 07/20/2021 0838   VLDL 12.8 07/20/2021 0838   LDLCALC 60 07/20/2021 0838   LDLCALC 66 07/19/2020 0835   LDLDIRECT 60.0 07/10/2017 0905     Risk Assessment/Calculations:   {Does this patient have ATRIAL FIBRILLATION?:[phone]}   Physical Exam:    VS:  There were no vitals taken for this visit.    Wt Readings from Last 3 Encounters:  04/09/22 125 lb 6.4 oz (56.9 kg)  04/02/22 128 lb 3.2 oz (58.2 kg)  03/31/22 128 lb 1.4 oz (58.1 kg)     GEN: *** Well nourished, well developed in no acute distress HEENT: Normal NECK: No JVD; No carotid bruits LYMPHATICS: No lymphadenopathy CARDIAC: ***RRR, no murmurs, rubs, gallops RESPIRATORY:  Clear to auscultation without rales, wheezing or rhonchi  ABDOMEN: Soft, non-tender, non-distended MUSCULOSKELETAL:  No edema; No deformity  SKIN: Warm and dry NEUROLOGIC:  Alert and oriented x 3 PSYCHIATRIC:  Normal affect   ASSESSMENT AND PLAN:    ***  2. ***  Medication Adjustments/Labs and Tests Ordered: Current medicines are reviewed at length with the patient today.  Concerns regarding medicines are outlined above.  No orders of the defined types were placed in this encounter.  No orders of the defined types were placed in this encounter.   There are no Patient Instructions on file for this visit.   Lorelei Pont, [place]  04/15/2022 11:23 AM    [facility]

## 2022-04-24 ENCOUNTER — Ambulatory Visit: Payer: Medicare Other | Attending: Cardiology | Admitting: *Deleted

## 2022-04-24 DIAGNOSIS — Z5181 Encounter for therapeutic drug level monitoring: Secondary | ICD-10-CM

## 2022-04-24 DIAGNOSIS — I480 Paroxysmal atrial fibrillation: Secondary | ICD-10-CM | POA: Diagnosis not present

## 2022-04-24 LAB — POCT INR: INR: 3.7 — AB (ref 2.0–3.0)

## 2022-04-24 NOTE — Patient Instructions (Signed)
Hold warfarin tonight then decrease dose to 1 tablet daily except 1/2 tablet Sundays, Tuesdays and Thursdays Continue greens/salads -Recheck INR in 2 weeks

## 2022-05-06 ENCOUNTER — Ambulatory Visit (INDEPENDENT_AMBULATORY_CARE_PROVIDER_SITE_OTHER): Payer: Medicare Other

## 2022-05-06 VITALS — BP 104/74 | HR 83 | Temp 96.8°F | Wt 121.0 lb

## 2022-05-06 DIAGNOSIS — Z0000 Encounter for general adult medical examination without abnormal findings: Secondary | ICD-10-CM | POA: Diagnosis not present

## 2022-05-06 NOTE — Patient Instructions (Signed)
Martin James , Thank you for taking time to come for your Medicare Wellness Visit. I appreciate your ongoing commitment to your health goals. Please review the following plan we discussed and let me know if I can assist you in the future.   These are the goals we discussed:  Goals      Maintain current health     Patient Stated     Maintain health         This is a list of the screening recommended for you and due dates:  Health Maintenance  Topic Date Due   COVID-19 Vaccine (6 - Moderna series) 12/07/2021   Flu Shot  11/03/2022*   Tetanus Vaccine  10/09/2031   Pneumonia Vaccine  Completed   Zoster (Shingles) Vaccine  Completed   HPV Vaccine  Aged Out  *Topic was postponed. The date shown is not the original due date.    Advanced directives: Please bring a copy of your health care power of attorney and living will to the office at your convenience.  Conditions/risks identified: maintain health   Next appointment: Follow up in one year for your annual wellness visit.   Preventive Care 74 Years and Older, Male  Preventive care refers to lifestyle choices and visits with your health care provider that can promote health and wellness. What does preventive care include? A yearly physical exam. This is also called an annual well check. Dental exams once or twice a year. Routine eye exams. Ask your health care provider how often you should have your eyes checked. Personal lifestyle choices, including: Daily care of your teeth and gums. Regular physical activity. Eating a healthy diet. Avoiding tobacco and drug use. Limiting alcohol use. Practicing safe sex. Taking low doses of aspirin every day. Taking vitamin and mineral supplements as recommended by your health care provider. What happens during an annual well check? The services and screenings done by your health care provider during your annual well check will depend on your age, overall health, lifestyle risk factors, and  family history of disease. Counseling  Your health care provider may ask you questions about your: Alcohol use. Tobacco use. Drug use. Emotional well-being. Home and relationship well-being. Sexual activity. Eating habits. History of falls. Memory and ability to understand (cognition). Work and work Astronomer. Screening  You may have the following tests or measurements: Height, weight, and BMI. Blood pressure. Lipid and cholesterol levels. These may be checked every 5 years, or more frequently if you are over 51 years old. Skin check. Lung cancer screening. You may have this screening every year starting at age 4 if you have a 30-pack-year history of smoking and currently smoke or have quit within the past 15 years. Fecal occult blood test (FOBT) of the stool. You may have this test every year starting at age 78. Flexible sigmoidoscopy or colonoscopy. You may have a sigmoidoscopy every 5 years or a colonoscopy every 10 years starting at age 22. Prostate cancer screening. Recommendations will vary depending on your family history and other risks. Hepatitis C blood test. Hepatitis B blood test. Sexually transmitted disease (STD) testing. Diabetes screening. This is done by checking your blood sugar (glucose) after you have not eaten for a while (fasting). You may have this done every 1-3 years. Abdominal aortic aneurysm (AAA) screening. You may need this if you are a current or former smoker. Osteoporosis. You may be screened starting at age 45 if you are at high risk. Talk with your health care  provider about your test results, treatment options, and if necessary, the need for more tests. Vaccines  Your health care provider may recommend certain vaccines, such as: Influenza vaccine. This is recommended every year. Tetanus, diphtheria, and acellular pertussis (Tdap, Td) vaccine. You may need a Td booster every 10 years. Zoster vaccine. You may need this after age 79. Pneumococcal  13-valent conjugate (PCV13) vaccine. One dose is recommended after age 35. Pneumococcal polysaccharide (PPSV23) vaccine. One dose is recommended after age 67. Talk to your health care provider about which screenings and vaccines you need and how often you need them. This information is not intended to replace advice given to you by your health care provider. Make sure you discuss any questions you have with your health care provider. Document Released: 08/18/2015 Document Revised: 04/10/2016 Document Reviewed: 05/23/2015 Elsevier Interactive Patient Education  2017 [place] Prevention in the Home Falls can cause injuries. They can happen to people of all ages. There are many things you can do to make your home safe and to help prevent falls. What can I do on the outside of my home? Regularly fix the edges of walkways and driveways and fix any cracks. Remove anything that might make you trip as you walk through a door, such as a raised step or threshold. Trim any bushes or trees on the path to your home. Use bright outdoor lighting. Clear any walking paths of anything that might make someone trip, such as rocks or tools. Regularly check to see if handrails are loose or broken. Make sure that both sides of any steps have handrails. Any raised decks and porches should have guardrails on the edges. Have any leaves, snow, or ice cleared regularly. Use sand or salt on walking paths during winter. Clean up any spills in your garage right away. This includes oil or grease spills. What can I do in the bathroom? Use night lights. Install grab bars by the toilet and in the tub and shower. Do not use towel bars as grab bars. Use non-skid mats or decals in the tub or shower. If you need to sit down in the shower, use a plastic, non-slip stool. Keep the floor dry. Clean up any water that spills on the floor as soon as it happens. Remove soap buildup in the tub or shower regularly. Attach  bath mats securely with double-sided non-slip rug tape. Do not have throw rugs and other things on the floor that can make you trip. What can I do in the bedroom? Use night lights. Make sure that you have a light by your bed that is easy to reach. Do not use any sheets or blankets that are too big for your bed. They should not hang down onto the floor. Have a firm chair that has side arms. You can use this for support while you get dressed. Do not have throw rugs and other things on the floor that can make you trip. What can I do in the kitchen? Clean up any spills right away. Avoid walking on wet floors. Keep items that you use a lot in easy-to-reach places. If you need to reach something above you, use a strong step stool that has a grab bar. Keep electrical cords out of the way. Do not use floor polish or wax that makes floors slippery. If you must use wax, use non-skid floor wax. Do not have throw rugs and other things on the floor that can make you trip. What can I  do with my stairs? Do not leave any items on the stairs. Make sure that there are handrails on both sides of the stairs and use them. Fix handrails that are broken or loose. Make sure that handrails are as long as the stairways. Check any carpeting to make sure that it is firmly attached to the stairs. Fix any carpet that is loose or worn. Avoid having throw rugs at the top or bottom of the stairs. If you do have throw rugs, attach them to the floor with carpet tape. Make sure that you have a light switch at the top of the stairs and the bottom of the stairs. If you do not have them, ask someone to add them for you. What else can I do to help prevent falls? Wear shoes that: Do not have high heels. Have rubber bottoms. Are comfortable and fit you well. Are closed at the toe. Do not wear sandals. If you use a stepladder: Make sure that it is fully opened. Do not climb a closed stepladder. Make sure that both sides of the  stepladder are locked into place. Ask someone to hold it for you, if possible. Clearly mark and make sure that you can see: Any grab bars or handrails. First and last steps. Where the edge of each step is. Use tools that help you move around (mobility aids) if they are needed. These include: Canes. Walkers. Scooters. Crutches. Turn on the lights when you go into a dark area. Replace any light bulbs as soon as they burn out. Set up your furniture so you have a clear path. Avoid moving your furniture around. If any of your floors are uneven, fix them. If there are any pets around you, be aware of where they are. Review your medicines with your doctor. Some medicines can make you feel dizzy. This can increase your chance of falling. Ask your doctor what other things that you can do to help prevent falls. This information is not intended to replace advice given to you by your health care provider. Make sure you discuss any questions you have with your health care provider. Document Released: 05/18/2009 Document Revised: 12/28/2015 Document Reviewed: 08/26/2014 Elsevier Interactive Patient Education  2017 [employer].

## 2022-05-06 NOTE — Progress Notes (Signed)
Virtual Visit via Telephone Note  I connected with  Martin James on 05/06/22 at  8:00 AM EDT by telephone and verified that I am speaking with the correct person using two identifiers.  Medicare Annual Wellness visit completed telephonically due to Covid-19 pandemic.   Persons participating in this call: This Health Coach and this patient.   Location: Patient: home Provider: office    I discussed the limitations, risks, security and privacy concerns of performing an evaluation and management service by telephone and the availability of in person appointments. The patient expressed understanding and agreed to proceed.  Unable to perform video visit due to video visit attempted and failed and/or patient does not have video capability.   Some vital signs may be absent or patient reported.   Martin Brace, LPN   Subjective:   Martin James is a 81 y.o. male who presents for Medicare Annual/Subsequent preventive examination.  Review of Systems     Cardiac Risk Factors include: advanced age (>23men, >52 women);hypertension;dyslipidemia;male gender     Objective:    Today's Vitals   05/06/22 0812  BP: 104/74  Pulse: 83  Temp: (!) 96.8 F (36 C)  Weight: 121 lb (54.9 kg)   Body mass index is 18.13 kg/m.     05/06/2022    8:06 AM 03/29/2022    5:50 PM 03/29/2022   10:28 AM 07/04/2020    7:56 AM 05/17/2020   11:00 AM 07/10/2018   10:03 AM 07/06/2014    3:26 PM  Advanced Directives  Does Patient Have a Medical Advance Directive? Yes No No No No No No  Type of Paramedic of [place];Living will        Copy of [place] in Chart? No - copy requested        Would patient like information on creating a medical advance directive?  No - Patient declined  No - Patient declined  Yes (MAU/Ambulatory/Procedural Areas - Information given)     Current Medications (verified) Outpatient Encounter Medications as of 05/06/2022  Medication Sig    atorvastatin (LIPITOR) 80 MG tablet Take 1 tablet by mouth once daily   dapagliflozin propanediol (FARXIGA) 10 MG TABS tablet Take 1 tablet (10 mg total) by mouth daily before breakfast.   digoxin (LANOXIN) 0.125 MG tablet TAKE 1 TABLET BY MOUTH ONCE DAILY   loratadine (CLARITIN) 10 MG tablet Take 10 mg by mouth daily as needed for allergies.   metoprolol succinate (TOPROL XL) 25 MG 24 hr tablet Take 1 tablet (25 mg total) by mouth daily.   spironolactone (ALDACTONE) 25 MG tablet Take 1 tablet (25 mg total) by mouth every morning.   warfarin (COUMADIN) 5 MG tablet TAKE ONE TABLET BY MOUTH DAILY EXCEPT ONE-HALF TABLET ON SUNDAY AND THURSDAY OR AS DIRECTED   aspirin EC 81 MG tablet Take 1 tablet (81 mg total) by mouth daily with breakfast. Swallow whole.   [DISCONTINUED] furosemide (LASIX) 20 MG tablet Take 1 tablet (20 mg total) by mouth as needed (for leg edema/swelling).   [DISCONTINUED] hydrOXYzine (ATARAX) 10 MG tablet Take 1 tablet (10 mg total) by mouth every evening. For sedation, ok to take 2 tabs if needed to help fall asleep.   No facility-administered encounter medications on file as of 05/06/2022.    Allergies (verified) Patient has no known allergies.   History: Past Medical History:  Diagnosis Date   Atrial fibrillation ([place])    CAD (coronary artery disease)  BMS x 2 distal RCA 1998; DES x 2 mid to distal RCA 2005, had occluded distal circumflex with left to left collaterals as well   Cataract    Depression    Essential hypertension    Hyperlipidemia    Insomnia    Ischemic cardiomyopathy    Sinus congestion    Past Surgical History:  Procedure Laterality Date   APPENDECTOMY     CATARACT EXTRACTION W/PHACO Right 07/08/2013   Procedure: CATARACT EXTRACTION PHACO AND INTRAOCULAR LENS PLACEMENT (IOC);  Surgeon: Gemma PayorKerry Hunt, MD;  Location: [facility];  Service: Ophthalmology;  Laterality: Right;  CDE 26.34   CORONARY ANGIOPLASTY     stents x4   RIGHT/LEFT HEART CATH AND  CORONARY ANGIOGRAPHY N/A 07/04/2020   Procedure: RIGHT/LEFT HEART CATH AND CORONARY ANGIOGRAPHY;  Surgeon: Dolores PattyBensimhon, Daniel R, MD;  Location: [facility];  Service: Cardiovascular;  Laterality: N/A;   ROTATOR CUFF REPAIR Right    Undescended testicle surgery     Family History  Problem Relation Age of Onset   Diabetes Father    Stroke Father    Colon cancer Neg Hx    Rectal cancer Neg Hx    Stomach cancer Neg Hx    Social History   Socioeconomic History   Marital status: Married    Spouse name: Tilford PillarBarbara Amborn   Number of children: 2   Years of education: Not on file   Highest education level: Bachelor's degree (e.g., BA, AB, BS)  Occupational History   Occupation: retired    Comment: Dentistcommerical aircraft mechanic  Tobacco Use   Smoking status: Former    Packs/day: 1.00    Years: 30.00    Total pack years: 30.00    Types: Cigarettes    Quit date: 08/05/1990    Years since quitting: 31.7   Smokeless tobacco: Never  Vaping Use   Vaping Use: Never used  Substance and Sexual Activity   Alcohol use: Yes    Alcohol/week: 2.0 standard drinks of alcohol    Types: 1 Glasses of wine, 1 Cans of beer per week    Comment: occassional   Drug use: No   Sexual activity: Not on file  Other Topics Concern   Not on file  Social History Narrative   Married 1964. 2 kids-son ([place]- in between businesses in 2021 looking at retring), daughter ([place]- had guillain barre syndrome). 5 grandkids.       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      Hobbies: walk 8 miles a day, neighborhood Gafferhandyman   Social Determinants of Health   Financial Resource Strain: Low Risk  (05/06/2022)   Overall Financial Resource Strain (CARDIA)    Difficulty of Paying Living Expenses: Not hard at all  Food Insecurity: No Food Insecurity (05/06/2022)   Hunger Vital Sign    Worried About Running Out of Food in the Last Year: Never true    Ran Out of Food in the Last Year: Never true  Transportation Needs: No Transportation  Needs (05/06/2022)   PRAPARE - Administrator, Civil ServiceTransportation    Lack of Transportation (Medical): No    Lack of Transportation (Non-Medical): No  Physical Activity: Sufficiently Active (05/06/2022)   Exercise Vital Sign    Days of Exercise per Week: 7 days    Minutes of Exercise per Session: 120 min  Stress: No Stress Concern Present (05/06/2022)   [employer] - Occupational Stress Questionnaire    Feeling of Stress : Not at all  Social Connections: Moderately Integrated (  05/06/2022)   Social Connection and Isolation Panel [NHANES]    Frequency of Communication with Friends and Family: More than three times a week    Frequency of Social Gatherings with Friends and Family: More than three times a week    Attends Religious Services: More than 4 times per year    Active Member of [employer] or Organizations: No    Attends Engineer, structural: Never    Marital Status: Married    Tobacco Counseling Counseling given: Not Answered   Clinical Intake:  Pre-visit preparation completed: Yes  Pain : No/denies pain     BMI - recorded: 18.13 Nutritional Status: BMI <19  Underweight Diabetes: No  How often do you need to have someone help you when you read instructions, pamphlets, or other written materials from your doctor or pharmacy?: 1 - Never  Diabetic?no  Interpreter Needed?: No  Information entered by :: Lanier Ensign, LPN   Activities of Daily Living    05/06/2022    8:07 AM 03/29/2022    5:50 PM  In your present state of health, do you have any difficulty performing the following activities:  Hearing? 0 0  Vision? 0 0  Difficulty concentrating or making decisions? 0 0  Walking or climbing stairs? 0 0  Dressing or bathing? 0 0  Doing errands, shopping? 0 0  Preparing Food and eating ? N   Using the Toilet? N   In the past six months, have you accidently leaked urine? N   Do you have problems with loss of bowel control? N   Managing your  Medications? N   Managing your Finances? N   Housekeeping or managing your Housekeeping? N     Patient Care Team: [name], MD as PCP - General (Family Medicine) Jonelle Sidle, MD as PCP - Cardiology (Cardiology)  Indicate any recent Medical Services you may have received from other than [facility] providers in the past year (date may be approximate).     Assessment:   This is a routine wellness examination for [place].  Hearing/Vision screen Hearing Screening - Comments:: Pt denies any hearing issues  Vision Screening - Comments:: Pt follows up with [facility] in [place]   Dietary issues and exercise activities discussed: Current Exercise Habits: Home exercise routine, Type of exercise: walking;Other - see comments, Time (Minutes): > 60, Frequency (Times/Week): 7, Weekly Exercise (Minutes/Week): 0   Goals Addressed             This Visit's Progress    Patient Stated       Maintain health        Depression Screen    05/06/2022    8:04 AM 07/20/2021    8:13 AM 07/19/2020    7:57 AM 07/13/2019    9:06 AM 07/10/2018   10:08 AM 07/10/2018    8:23 AM 12/31/2016    9:09 AM  PHQ 2/9 Scores  PHQ - 2 Score 0 0 0 0 0 0 0  PHQ- 9 Score    0 0      Fall Risk    05/06/2022    8:07 AM 07/20/2021    8:13 AM 07/19/2020    7:57 AM 07/13/2019    9:06 AM 07/10/2018   10:08 AM  Fall Risk   Falls in the past year? 0 0 0 0 0  Number falls in past yr: 0 0 0 0   Injury with Fall? 0 0 0 0   Risk for fall due  to : Impaired vision      Follow up Falls prevention discussed        FALL RISK PREVENTION PERTAINING TO THE HOME:  Any stairs in or around the home? Yes  If so, are there any without handrails? No  Home free of loose throw rugs in walkways, pet beds, electrical cords, etc? Yes  Adequate lighting in your home to reduce risk of falls? Yes   ASSISTIVE DEVICES UTILIZED TO PREVENT FALLS:  Life alert? No  Use of a cane, walker or w/c? No  Grab bars in the bathroom? Yes   Shower chair or bench in shower? No  Elevated toilet seat or a handicapped toilet? No   TIMED UP AND GO:  Was the test performed? No .   Cognitive Function:        05/06/2022    8:08 AM  6CIT Screen  What Year? 0 points  What month? 0 points  What time? 0 points  Count back from 20 0 points  Months in reverse 0 points  Repeat phrase 0 points  Total Score 0 points    Immunizations Immunization History  Administered Date(s) Administered   Influenza Whole 06/04/2007, 06/07/2009, 05/03/2010   Influenza, High Dose Seasonal PF 05/10/2013, 05/25/2016, 04/30/2018, 05/25/2019, 05/03/2020, 05/28/2021   Influenza-Unspecified 05/14/2014, 06/21/2015, 05/13/2017, 04/30/2018   Moderna Covid-19 Vaccine Bivalent Booster 66yrs & up 08/09/2021   Moderna Sars-Covid-2 Vaccination 08/28/2019, 09/27/2019, 06/02/2020, 12/02/2020   Pneumococcal Conjugate-13 06/21/2015   Pneumococcal Polysaccharide-23 06/04/2007, 05/28/2021   Tdap 06/11/2012, 10/08/2021   Zoster Recombinat (Shingrix) 08/09/2021, 10/08/2021    TDAP status: Up to date  Flu Vaccine status: Due, Education has been provided regarding the importance of this vaccine. Advised may receive this vaccine at local pharmacy or Health Dept. Aware to provide a copy of the vaccination record if obtained from local pharmacy or Health Dept. Verbalized acceptance and understanding.  Pneumococcal vaccine status: Up to date  Covid-19 vaccine status: Completed vaccines  Qualifies for Shingles Vaccine? Yes   Zostavax completed Yes   Shingrix Completed?: Yes  Screening Tests Health Maintenance  Topic Date Due   COVID-19 Vaccine (6 - Moderna series) 12/07/2021   INFLUENZA VACCINE  11/03/2022 (Originally 03/05/2022)   TETANUS/TDAP  10/09/2031   Pneumonia Vaccine 18+ Years old  Completed   Zoster Vaccines- Shingrix  Completed   HPV VACCINES  Aged Out    Health Maintenance  Health Maintenance Due  Topic Date Due   COVID-19 Vaccine (6 -  Moderna series) 12/07/2021    Colorectal cancer screening: No longer required.    Additional Screening:   Vision Screening: Recommended annual ophthalmology exams for early detection of glaucoma and other disorders of the eye. Is the patient up to date with their annual eye exam?  Yes  Who is the provider or what is the name of the office in which the patient attends annual eye exams? [facility] in [place]  If pt is not established with a provider, would they like to be referred to a provider to establish care? No .   Dental Screening: Recommended annual dental exams for proper oral hygiene  Community Resource Referral / Chronic Care Management: CRR required this visit?  No   CCM required this visit?  No      Plan:     I have personally reviewed and noted the following in the patient's chart:   Medical and social history Use of alcohol, tobacco or illicit drugs  Current medications and supplements including opioid  prescriptions. Patient is not currently taking opioid prescriptions. Functional ability and status Nutritional status Physical activity Advanced directives List of other physicians Hospitalizations, surgeries, and ER visits in previous 12 months Vitals Screenings to include cognitive, depression, and falls Referrals and appointments  In addition, I have reviewed and discussed with patient certain preventive protocols, quality metrics, and best practice recommendations. A written personalized care plan for preventive services as well as general preventive health recommendations were provided to patient.     Marzella Schlein, LPN   73/11/1936   Nurse Notes: none

## 2022-05-07 ENCOUNTER — Ambulatory Visit: Payer: Medicare Other | Attending: Cardiology | Admitting: *Deleted

## 2022-05-07 DIAGNOSIS — I480 Paroxysmal atrial fibrillation: Secondary | ICD-10-CM

## 2022-05-07 DIAGNOSIS — Z5181 Encounter for therapeutic drug level monitoring: Secondary | ICD-10-CM

## 2022-05-07 LAB — POCT INR: INR: 3.7 — AB (ref 2.0–3.0)

## 2022-05-07 NOTE — Patient Instructions (Signed)
Hold warfarin tonight then decrease dose to 1/2 tablet daily except 1 tablet on Sundays and Thursdays Continue greens/salads -Recheck INR in 2 weeks

## 2022-05-09 ENCOUNTER — Ambulatory Visit (HOSPITAL_BASED_OUTPATIENT_CLINIC_OR_DEPARTMENT_OTHER)
Admission: RE | Admit: 2022-05-09 | Discharge: 2022-05-09 | Disposition: A | Discharge status: Home / Self Care | Payer: Medicare Other | Source: Ambulatory Visit

## 2022-05-09 ENCOUNTER — Encounter (HOSPITAL_COMMUNITY): Payer: Self-pay | Admitting: Internal Medicine

## 2022-05-09 ENCOUNTER — Other Ambulatory Visit (HOSPITAL_COMMUNITY): Payer: Self-pay | Admitting: Internal Medicine

## 2022-05-09 ENCOUNTER — Ambulatory Visit (HOSPITAL_COMMUNITY)
Admission: RE | Admit: 2022-05-09 | Discharge: 2022-05-09 | Disposition: A | Discharge status: Home / Self Care | Payer: Medicare Other | Source: Ambulatory Visit | Attending: Internal Medicine | Admitting: Internal Medicine

## 2022-05-09 ENCOUNTER — Inpatient Hospital Stay (HOSPITAL_COMMUNITY)
Admission: RE | Admit: 2022-05-09 | Discharge: 2022-05-09 | Disposition: A | Discharge status: Home / Self Care | Payer: Medicare Other | Source: Ambulatory Visit | Attending: Internal Medicine | Admitting: Internal Medicine

## 2022-05-09 VITALS — BP 108/76 | HR 82 | Wt 125.0 lb

## 2022-05-09 DIAGNOSIS — I493 Ventricular premature depolarization: Secondary | ICD-10-CM

## 2022-05-09 DIAGNOSIS — R0989 Other specified symptoms and signs involving the circulatory and respiratory systems: Secondary | ICD-10-CM | POA: Insufficient documentation

## 2022-05-09 DIAGNOSIS — Z7984 Long term (current) use of oral hypoglycemic drugs: Secondary | ICD-10-CM | POA: Insufficient documentation

## 2022-05-09 DIAGNOSIS — I4821 Permanent atrial fibrillation: Secondary | ICD-10-CM | POA: Insufficient documentation

## 2022-05-09 DIAGNOSIS — Z79899 Other long term (current) drug therapy: Secondary | ICD-10-CM | POA: Diagnosis not present

## 2022-05-09 DIAGNOSIS — I110 Hypertensive heart disease with heart failure: Secondary | ICD-10-CM | POA: Insufficient documentation

## 2022-05-09 DIAGNOSIS — I2510 Atherosclerotic heart disease of native coronary artery without angina pectoris: Secondary | ICD-10-CM | POA: Diagnosis not present

## 2022-05-09 DIAGNOSIS — I480 Paroxysmal atrial fibrillation: Secondary | ICD-10-CM | POA: Diagnosis not present

## 2022-05-09 DIAGNOSIS — I255 Ischemic cardiomyopathy: Secondary | ICD-10-CM | POA: Diagnosis not present

## 2022-05-09 DIAGNOSIS — I5022 Chronic systolic (congestive) heart failure: Secondary | ICD-10-CM | POA: Insufficient documentation

## 2022-05-09 DIAGNOSIS — I350 Nonrheumatic aortic (valve) stenosis: Secondary | ICD-10-CM

## 2022-05-09 DIAGNOSIS — R64 Cachexia: Secondary | ICD-10-CM | POA: Insufficient documentation

## 2022-05-09 DIAGNOSIS — I252 Old myocardial infarction: Secondary | ICD-10-CM | POA: Insufficient documentation

## 2022-05-09 DIAGNOSIS — I5020 Unspecified systolic (congestive) heart failure: Secondary | ICD-10-CM

## 2022-05-09 LAB — ECHOCARDIOGRAM COMPLETE
AR max vel: 0.89 cm2
AV Area VTI: 0.88 cm2
AV Area mean vel: 0.91 cm2
AV Mean grad: 15.8 mmHg
AV Peak grad: 30.1 mmHg
Ao pk vel: 2.74 m/s
Area-P 1/2: 4.08 cm2
MV M vel: 4.8 m/s
MV Peak grad: 92.2 mmHg
P 1/2 time: 368 msec
Radius: 0.5 cm
S' Lateral: 5.6 cm

## 2022-05-09 LAB — CBC
HCT: 43.8 % (ref 39.0–52.0)
Hemoglobin: 14.4 g/dL (ref 13.0–17.0)
MCH: 32.2 pg (ref 26.0–34.0)
MCHC: 32.9 g/dL (ref 30.0–36.0)
MCV: 98 fL (ref 80.0–100.0)
Platelets: 161 10*3/uL (ref 150–400)
RBC: 4.47 MIL/uL (ref 4.22–5.81)
RDW: 13.6 % (ref 11.5–15.5)
WBC: 5.7 10*3/uL (ref 4.0–10.5)
nRBC: 0 % (ref 0.0–0.2)

## 2022-05-09 NOTE — Progress Notes (Signed)
ADVANCED HF CLINIC CONSULT NOTE  Referring Physician: Dr Harl Bowie Primary Care: Dr Hunger  Primary Cardiologist: Dr Domenic Polite  HPI: Mr Martin James is a 81 year old with a history of HFrEF, ICM, CAD s/p BMS x2 distal RCA 1998; DES x2 mid to distal RCA 2005, occluded distal Lcx with L-L collaterals, permament atrial fibrillation on coumadin, HTN,  and depression.   Followed closely by Dr Domenic Polite. Intolerant to entresto due to hypotension. As he requested conservative management. Walking 10 miles a day until ~july. In July he went to [place] and was eating out at least once a day. After he returned from vacation he gradually developed orthopnea and fatigue. His wife convinced him to go to the ED.    Admitted to [facility] 03/29/22 with A/C HFrEF. BNP 1057. He had been complaint with medications. Diuresed with IV lasix and transitioned to lasix 20 mg daily. Toprol XL cut back. Discharge weight 128 pounds.   Here for HF f/u visit w/ wife. Retired Careers information officer. Overall feeling fine. Walking 5 miles a day, feels like he could keep going but only does his 5. Myeloma panel negative. Denies SOB/CP/PND/Orthopnea. Appetite ok. No fever or chills. Weight at home 122 pounds. Taking all medications.  Cardiac Testing  Echo today- EF <20. RV sys function normal. RV size normal. Mod elevated PASP. LA severely dilated, RA mild-mod dilated. Mild-mod MR and TR. Aortic valve gradient 15.8 mmHg. Severe aortic valve stenosis.  Echo 10/2021 -LVEF 30-35%. Aortic stenosis moderate to severe. Mean gradient 18 mmhg Echo 2021 LVEF 20-25% RV mildly reduced LA severely dilated, RA moderately dilated, mild-mod AS, mean gradient 11.5 mmhg.  Echo 2020 EF 35-40% . Mild AS, LA severely dilated.   Cath 2021 Mid RCA to Dist RCA lesion is 100% stenosed. Mid Cx to Dist Cx lesion is 99% stenosed. Prox LAD to Mid LAD lesion is 40% stenosed. Prox Cx to Mid Cx lesion is 30% stenosed.  Ao = 109/60 LV = 113/5 RA =  1 RV =  25/1 PA = 24/7 (14) PCW = 7 Fick cardiac output/index = 4.7/2.6 PVR = 1.5 WU FA sat = 99% PA sat = 71%, 76%  Assessment: 1.  2v CAD with total occlusion of mRCA with good L -> R collaterals and high grade lesion in distal AV-groove LCX with L->L collaterals 2. EF 20% 3. Low filling pressures with normal cardiac output  Review of Systems: [y] = yes, [ ]  = no      General: Weight gain [] ; Weight loss [ ] ; Anorexia [ ] ; Fatigue [y]; Fever [ ] ; Chills [ ] ; Weakness Blue.Reese ]   Cardiac: Chest pain/pressure [ ] ; Resting SOB [ y]; Exertional SOB [y]; Orthopnea [ ] ; Pedal Edema [] ; Palpitations [ ] ; Syncope [ ] ; Presyncope [ ] ; Paroxysmal nocturnal dyspnea[ ]    Pulmonary: Cough [ ] ; Wheezing[ ] ; Hemoptysis[ ] ; Sputum [ ] ; Snoring [ ]    GI: Vomiting[ ] ; Dysphagia[ ] ; Melena[ ] ; Hematochezia [ ] ; Heartburn[ ] ; Abdominal pain [ ] ; Constipation [ ] ; Diarrhea [ ] ; BRBPR [ ]    GU: Hematuria[ ] ; Dysuria [ ] ; Nocturia[ ]  Vascular: Pain in legs with walking [ ] ; Pain in feet with lying flat [ ] ; Non-healing sores [ ] ; Stroke [ ] ; TIA [ ] ; Slurred speech [ ] ;   Neuro: Headaches[ ] ; Vertigo[ ] ; Seizures[ ] ; Paresthesias[ ] ;Blurred vision [ ] ; Diplopia [ ] ; Vision changes [ ]    Ortho/Skin: Arthritis [y]; Joint pain [y]; Muscle  pain [ ] ; Joint swelling [ ] ; Back Pain [ ] ; Rash [ ]    Psych: Depression[ ] ; Anxiety[ ]    Heme: Bleeding problems [ ] ; Clotting disorders [ ] ; Anemia [ ]    Endocrine: Diabetes [ ] ; Thyroid dysfunction[ ]      Past Medical History:  Diagnosis Date   Atrial fibrillation (HCC)    CAD (coronary artery disease)    BMS x 2 distal RCA 1998; DES x 2 mid to distal RCA 2005, had occluded distal circumflex with left to left collaterals as well   Cataract    Depression    Essential hypertension    Hyperlipidemia    Insomnia    Ischemic cardiomyopathy    Sinus congestion     Current Outpatient Medications  Medication Sig Dispense Refill   aspirin EC 81 MG tablet Take 1 tablet (81 mg  total) by mouth daily with breakfast. Swallow whole. 120 tablet 3   atorvastatin (LIPITOR) 80 MG tablet Take 1 tablet by mouth once daily 90 tablet 0   dapagliflozin propanediol (FARXIGA) 10 MG TABS tablet Take 1 tablet (10 mg total) by mouth daily before breakfast. 30 tablet 3   digoxin (LANOXIN) 0.125 MG tablet TAKE 1 TABLET BY MOUTH ONCE DAILY 90 tablet 1   loratadine (CLARITIN) 10 MG tablet Take 10 mg by mouth daily as needed for allergies.     metoprolol succinate (TOPROL XL) 25 MG 24 hr tablet Take 1 tablet (25 mg total) by mouth daily. 90 tablet 3   spironolactone (ALDACTONE) 25 MG tablet Take 1 tablet (25 mg total) by mouth every morning. 90 tablet 3   warfarin (COUMADIN) 5 MG tablet TAKE ONE TABLET BY MOUTH DAILY EXCEPT ONE-HALF TABLET ON SUNDAY AND THURSDAY OR AS DIRECTED 75 tablet 1   No current facility-administered medications for this encounter.    No Known Allergies    Social History   Socioeconomic History   Marital status: Married    Spouse name: Martin James   Number of children: 2   Years of education: Not on file   Highest education level: Bachelor's degree (e.g., BA, AB, BS)  Occupational History   Occupation: retired    Comment: Ambulance person  Tobacco Use   Smoking status: Former    Packs/day: 1.00    Years: 30.00    Total pack years: 30.00    Types: Cigarettes    Quit date: 08/05/1990    Years since quitting: 31.7   Smokeless tobacco: Never  Vaping Use   Vaping Use: Never used  Substance and Sexual Activity   Alcohol use: Yes    Alcohol/week: 2.0 standard drinks of alcohol    Types: 1 Glasses of wine, 1 Cans of beer per week    Comment: occassional   Drug use: No   Sexual activity: Not on file  Other Topics Concern   Not on file  Social History Narrative   Married 1964. 2 kids-son ([place]- in between businesses in 2021 looking at retring), daughter ([place]- had guillain barre syndrome). 5 grandkids.       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       Hobbies: walk 8 miles a day, neighborhood Animator   Social Determinants of [place] Strain: [place]  (05/06/2022)   Overall Financial Resource Strain (CARDIA)    Difficulty of Paying Living Expenses: Not hard at all  Food Insecurity: No Food Insecurity (05/06/2022)   Hunger Vital Sign    Worried About Running Out of  Food in the Last Year: Never true    [place] in the Last Year: Never true  Transportation Needs: No Transportation Needs (05/06/2022)   PRAPARE - Hydrologist (Medical): No    Lack of Transportation (Non-Medical): No  Physical Activity: Sufficiently Active (05/06/2022)   Exercise Vital Sign    Days of Exercise per Week: 7 days    Minutes of Exercise per Session: 120 min  Stress: No Stress Concern Present (05/06/2022)   [place]    Feeling of Stress : Not at all  Social Connections: Moderately Integrated (05/06/2022)   Social Connection and Isolation Panel [NHANES]    Frequency of Communication with Friends and Family: More than three times a week    Frequency of Social Gatherings with Friends and Family: More than three times a week    Attends Religious Services: More than 4 times per year    Active Member of [employer] or Organizations: No    Attends Archivist Meetings: Never    Marital Status: Married  Human resources officer Violence: Not At Risk (05/06/2022)   Humiliation, Afraid, Rape, and Kick questionnaire    Fear of Current or Ex-Partner: No    Emotionally Abused: No    Physically Abused: No    Sexually Abused: No      Family History  Problem Relation Age of Onset   Diabetes Father    Stroke Father    Colon cancer Neg Hx    Rectal cancer Neg Hx    Stomach cancer Neg Hx     Vitals:   05/09/22 1523 05/09/22 1617  BP: (!) 90/50 108/76  Pulse: 82   SpO2: 95%   Weight: 56.7 kg (125 lb)     PHYSICAL EXAM: General:  well, bony  appearing.  No respiratory difficulty HEENT: normal Neck: supple. No JVD. Carotids 2+ bilat; no bruits. No lymphadenopathy or thyromegaly appreciated. Cor: PMI nondisplaced. Irregular rate & rhythm. No rubs, gallops, + MR murmur. Lungs: clear Abdomen: soft, nontender, nondistended. No hepatosplenomegaly. No bruits or masses. Good bowel sounds. Extremities: no cyanosis, clubbing, rash, edema  Neuro: alert & oriented x 3, cranial nerves grossly intact. moves all 4 extremities w/o difficulty. Affect pleasant.   ECG: A fib with PVC's, Nonspecific intraventricular conduction delay 88 bpm  ASSESSMENT & PLAN: 1. Chronic HFrEF, mixed ICM/Valvular Disease -10/2021 Echo EF Ef 30-35% with mod-severe AS. EF was a little better but AS concerning. ? Amyloid.  - Cath 2021-2v CAD with total occlusion of mRCA with good L -> R collaterals and high grade lesion in distal AV-groove LCX with L->L collaterals  - Had an admit July for A/C HFrEF   - Echo today- EF <20. RV sys function normal. RV size normal. Mod elevated PASP. LA severely dilated, RA mild-mod dilated. Mild-mod MR and TR. Aortic valve gradient 15.8 mmHg. Severe aortic valve stenosis.  - NYHA II on exam, volume stable - On lasix PRN, hasn't needed any recently - Continue metoprolol 25 mg daily - Failed Entresto 2/2 hypotension - Continue Spiro 25 mg daily - Continue Farxiga 10 mg daily - Myeloma panel negative - cMRI ordered  2. Aortic Stenosis  - 10/2021 Low flow low gradient mod-severe AS. Mean gradient 18 mmhg - Today echo: Severe AS, mean gradient 15.8 mmHg - Myeloma panel negative - Possible referral to Structural Heart Team for possible TAVR eval  3. CAD -Cath- 2v CAD with total  occlusion of mRCA with good L -> R collaterals and high grade lesion in distal AV-groove LCX with L->L collaterals -On statin, asa, bb -No chest pain.   4. A fib, permanent -Rate controlled on bb and dig.  -On coumadin.   -Rate controlled w/ frequent PVC's  today, will do 14 day Zio  -Following Zio results will do amiodarone 200 mg daily  Close f/u in 4-6 weeks to address Zio and cMRI. Encouraged to eat foods high in protein for some weight gain.   Earnie Larsson, AGACNP-BC  2:50 PM 05/09/22   Patient seen and examined with the above-signed Advanced Practice Provider and/or Housestaff. I personally reviewed laboratory data, imaging studies and relevant notes. I independently examined the patient and formulated the important aspects of the plan. I have edited the note to reflect any of my changes or salient points. I have personally discussed the plan with the patient and/or family.  81 y/o male as above with CAD, chronic AF and AS referred for further evaluation of progressive CM   Has h/o MI with ischemic CM EF previously in 40% range however EF has dropped steadily. Recent cath with 2v CAD but EF reduced out of proportion to CAD.   Developed HF symptoms over past 2 months but improved with lasix.   Now NYHA II. Volume status ok  Echo today EF < 20% RV ok. Severe low gradient AS  General:  Cachetic No resp difficulty HEENT: normal Neck: supple. no JVD. Carotids 2+ bilat; + bruits. No lymphadenopathy or thryomegaly appreciated. Cor: PMI nondisplaced. irregular rate & rhythm. 2/6 AS s2 only mildly reduced Lungs: clear Abdomen: soft, nontender, nondistended. No hepatosplenomegaly. No bruits or masses. Good bowel sounds. Extremities: no cyanosis, clubbing, rash, edema Neuro: alert & orientedx3, cranial nerves grossly intact. moves all 4 extremities w/o difficulty. Affect pleasant  He feels pretty good despite progressive reduction in EF. Cause of worsening EF unclear. Cardiomyopathy out of proportion to CAD. Suspect possible PVC CM. Amyloid and AS cardiomyopathy also on differential however AS does not sound critical on exam today.   Will place Zio to quantify PVCs and get cMRI. Eventually will need to considerTAVR but would like to get EF  better first and also stressed need to have him gain some weight. I will see back in 4-6 weeks. If he has frequent PVCs will need amio for PVC suppression.   Total time spent 45 minutes. Over half that time spent discussing above.   Glori Bickers, MD  4:46 PM

## 2022-05-09 NOTE — Patient Instructions (Signed)
Good to see you today!  Your physician has requested that you have a cardiac MRI. Cardiac MRI uses a computer to create images of your heart as its beating, producing both still and moving pictures of your heart and major blood vessels. For further information please visit [email or web address] Please follow the instruction sheet given to you today for more information. Once approved by insurance you will be contacted for an appointment  Labs done today, your results will be available in MyChart, we will contact you for abnormal readings.  Your provider has recommended that  you wear a Zio Patch for 14 days.  This monitor will record your heart rhythm for our review.  IF you have any symptoms while wearing the monitor please press the button.  If you have any issues with the patch or you notice a red or orange light on it please call the company at [phone].  Once you remove the patch please mail it back to the company as soon as possible so we can get the results.  Your physician recommends that you schedule a follow-up appointment in: 4-6 weeks  If you have any questions or concerns before your next appointment please send [place] a message through [place] or call our office at [phone].    TO LEAVE A MESSAGE FOR THE NURSE SELECT OPTION 2, PLEASE LEAVE A MESSAGE INCLUDING: YOUR NAME DATE OF BIRTH CALL BACK NUMBER REASON FOR CALL**this is important as we prioritize the call backs  YOU WILL RECEIVE A CALL BACK THE SAME DAY AS LONG AS YOU CALL BEFORE 4:00 PM  At the [facility], you and your health needs are our priority. As part of our continuing mission to provide you with exceptional heart care, we have created designated Provider Care Teams. These Care Teams include your primary Cardiologist (physician) and Advanced Practice Providers (APPs- Physician Assistants and Nurse Practitioners) who all work together to provide you with the care you need, when you need it.   You  may see any of the following providers on your designated Care Team at your next follow up: Dr Glori Bickers Dr Loralie Champagne Dr. Roxana Hires, NP Lyda Jester, [place] [facility] [place], [place] Forestine Na, NP Audry Riles, PharmD   Please be sure to bring in all your medications bottles to every appointment.

## 2022-05-21 ENCOUNTER — Ambulatory Visit: Payer: Medicare Other | Attending: Cardiology | Admitting: *Deleted

## 2022-05-21 DIAGNOSIS — Z5181 Encounter for therapeutic drug level monitoring: Secondary | ICD-10-CM

## 2022-05-21 DIAGNOSIS — I480 Paroxysmal atrial fibrillation: Secondary | ICD-10-CM

## 2022-05-21 LAB — POCT INR: INR: 1.8 — AB (ref 2.0–3.0)

## 2022-05-21 NOTE — Patient Instructions (Signed)
Increase warfarin to 1/2 tablet daily except 1 tablet on Tuesday, Thursdays and Saturdays Continue greens/salads -Recheck INR in 3 weeks

## 2022-05-29 DIAGNOSIS — I493 Ventricular premature depolarization: Secondary | ICD-10-CM | POA: Diagnosis not present

## 2022-06-11 ENCOUNTER — Ambulatory Visit: Payer: Medicare Other | Attending: Cardiology | Admitting: *Deleted

## 2022-06-11 ENCOUNTER — Encounter (HOSPITAL_COMMUNITY): Payer: Self-pay | Admitting: Internal Medicine

## 2022-06-11 ENCOUNTER — Ambulatory Visit (HOSPITAL_COMMUNITY)
Admission: RE | Admit: 2022-06-11 | Discharge: 2022-06-11 | Disposition: A | Discharge status: Home / Self Care | Payer: Medicare Other | Source: Ambulatory Visit | Attending: Internal Medicine | Admitting: Internal Medicine

## 2022-06-11 VITALS — BP 100/60 | HR 72 | Wt 126.0 lb

## 2022-06-11 DIAGNOSIS — I493 Ventricular premature depolarization: Secondary | ICD-10-CM | POA: Diagnosis not present

## 2022-06-11 DIAGNOSIS — Z955 Presence of coronary angioplasty implant and graft: Secondary | ICD-10-CM | POA: Diagnosis not present

## 2022-06-11 DIAGNOSIS — I110 Hypertensive heart disease with heart failure: Secondary | ICD-10-CM | POA: Diagnosis not present

## 2022-06-11 DIAGNOSIS — Z7901 Long term (current) use of anticoagulants: Secondary | ICD-10-CM | POA: Diagnosis not present

## 2022-06-11 DIAGNOSIS — I4821 Permanent atrial fibrillation: Secondary | ICD-10-CM | POA: Diagnosis not present

## 2022-06-11 DIAGNOSIS — I350 Nonrheumatic aortic (valve) stenosis: Secondary | ICD-10-CM | POA: Insufficient documentation

## 2022-06-11 DIAGNOSIS — I255 Ischemic cardiomyopathy: Secondary | ICD-10-CM | POA: Insufficient documentation

## 2022-06-11 DIAGNOSIS — I5022 Chronic systolic (congestive) heart failure: Secondary | ICD-10-CM | POA: Insufficient documentation

## 2022-06-11 DIAGNOSIS — I480 Paroxysmal atrial fibrillation: Secondary | ICD-10-CM

## 2022-06-11 DIAGNOSIS — Z79899 Other long term (current) drug therapy: Secondary | ICD-10-CM | POA: Diagnosis not present

## 2022-06-11 DIAGNOSIS — Z5181 Encounter for therapeutic drug level monitoring: Secondary | ICD-10-CM

## 2022-06-11 DIAGNOSIS — I2510 Atherosclerotic heart disease of native coronary artery without angina pectoris: Secondary | ICD-10-CM | POA: Insufficient documentation

## 2022-06-11 DIAGNOSIS — Z87891 Personal history of nicotine dependence: Secondary | ICD-10-CM | POA: Insufficient documentation

## 2022-06-11 LAB — POCT INR: INR: 2.4 (ref 2.0–3.0)

## 2022-06-11 LAB — BASIC METABOLIC PANEL
Anion gap: 9 (ref 5–15)
BUN: 19 mg/dL (ref 8–23)
CO2: 23 mmol/L (ref 22–32)
Calcium: 9.3 mg/dL (ref 8.9–10.3)
Chloride: 105 mmol/L (ref 98–111)
Creatinine, Ser: 1.1 mg/dL (ref 0.61–1.24)
GFR, Estimated: >60 mL/min (ref >60)
Glucose, Bld: 100 mg/dL — ABNORMAL HIGH (ref 70–99)
Potassium: 4.7 mmol/L (ref 3.5–5.1)
Sodium: 137 mmol/L (ref 135–145)

## 2022-06-11 LAB — BRAIN NATRIURETIC PEPTIDE: B Natriuretic Peptide: 939.7 pg/mL — ABNORMAL HIGH (ref 0.0–100.0)

## 2022-06-11 NOTE — Progress Notes (Signed)
ADVANCED HF CLINIC CONSULT NOTE  Referring Physician: Dr Harl Bowie Primary Care: Dr Hunger  Primary Cardiologist: Dr Domenic Polite  HPI: Martin James is a 81 year old with a history of HFrEF, ICM, CAD s/p BMS x2 distal RCA 1998; DES x2 mid to distal RCA 2005, occluded distal Lcx with L-L collaterals, permament atrial fibrillation on coumadin, HTN,  and depression.   Followed closely by Dr Domenic Polite. Intolerant to entresto due to hypotension. As he requested conservative management. Walking 10 miles a day until ~july. In July he went to [place] and was eating out at least once a day. After he returned from vacation he gradually developed orthopnea and fatigue. His wife convinced him to go to the ED.    Admitted to [facility] 03/29/22 with A/C HFrEF. BNP 1057. He had been complaint with medications. Diuresed with IV lasix and transitioned to lasix 20 mg daily. Toprol XL cut back. Discharge weight 128 pounds.   Here for HF f/u visit w/ wife. Retired Careers information officer. At last visit we were concerned about possible PVC CM. Zio showed only 3.5% PVC. Also ordered cMRI but not done yet  Here with his wife. Still walking 5 miles per day (breaks it up throughout the day - 12,000 steps/day)  Denies CP or SOB. No edema. Gained 1 pound.   Zio 10/23 1. Atrial Fibrillation occurred continuously (100% burden), ranging from 36-170 bpm (avg of 76 bpm). 2. Intermittent Bundle Branch Block was present.  3. Three runs of nonsustained Ventricular Tachycardia runs occurred, the run with the fastest interval lasting 10 beats with a max rate of 218 bpm (avg 162 bpm); the run with the fastest interval was also the longest.  4. Occasional PVCs (3.5%, 81191), PVC Couplets were occasional (1.4%, 9582)  Cardiac Testing  Echo 05/09/22 - EF <20. RV sys function normal. RV size normal. Mod elevated PASP. LA severely dilated, RA mild-mod dilated. Mild-mod Martin and TR. Aortic valve gradient 15.8 mmHg. Severe aortic valve  stenosis.  Echo 10/2021 -LVEF 30-35%. Aortic stenosis moderate to severe. Mean gradient 18 mmhg Echo 2021 LVEF 20-25% RV mildly reduced LA severely dilated, RA moderately dilated, mild-mod AS, mean gradient 11.5 mmhg.  Echo 2020 EF 35-40% . Mild AS, LA severely dilated.   Cath 2021 Mid RCA to Dist RCA lesion is 100% stenosed. Mid Cx to Dist Cx lesion is 99% stenosed. Prox LAD to Mid LAD lesion is 40% stenosed. Prox Cx to Mid Cx lesion is 30% stenosed.  Ao = 109/60 LV = 113/5 RA =  1 RV = 25/1 PA = 24/7 (14) PCW = 7 Fick cardiac output/index = 4.7/2.6 PVR = 1.5 WU FA sat = 99% PA sat = 71%, 76%  Assessment: 1.  2v CAD with total occlusion of mRCA with good L -> R collaterals and high grade lesion in distal AV-groove LCX with L->L collaterals 2. EF 20% 3. Low filling pressures with normal cardiac output     Past Medical History:  Diagnosis Date   Atrial fibrillation ([place])    CAD (coronary artery disease)    BMS x 2 distal RCA 1998; DES x 2 mid to distal RCA 2005, had occluded distal circumflex with left to left collaterals as well   Cataract    Depression    Essential hypertension    Hyperlipidemia    Insomnia    Ischemic cardiomyopathy    Sinus congestion     Current Outpatient Medications  Medication Sig Dispense Refill  aspirin EC 81 MG tablet Take 1 tablet (81 mg total) by mouth daily with breakfast. Swallow whole. 120 tablet 3   atorvastatin (LIPITOR) 80 MG tablet Take 1 tablet by mouth once daily 90 tablet 0   dapagliflozin propanediol (FARXIGA) 10 MG TABS tablet Take 1 tablet (10 mg total) by mouth daily before breakfast. 30 tablet 3   digoxin (LANOXIN) 0.125 MG tablet TAKE 1 TABLET BY MOUTH ONCE DAILY 90 tablet 1   loratadine (CLARITIN) 10 MG tablet Take 10 mg by mouth daily as needed for allergies.     metoprolol succinate (TOPROL XL) 25 MG 24 hr tablet Take 1 tablet (25 mg total) by mouth daily. 90 tablet 3   spironolactone (ALDACTONE) 25 MG tablet Take 1  tablet (25 mg total) by mouth every morning. 90 tablet 3   warfarin (COUMADIN) 5 MG tablet TAKE ONE TABLET BY MOUTH DAILY EXCEPT ONE-HALF TABLET ON SUNDAY AND THURSDAY OR AS DIRECTED 75 tablet 1   No current facility-administered medications for this encounter.    No Known Allergies    Social History   Socioeconomic History   Marital status: Married    Spouse name: Masoud Schmidtke   Number of children: 2   Years of education: Not on file   Highest education level: Bachelor's degree (e.g., BA, AB, BS)  Occupational History   Occupation: retired    Comment: Ambulance person  Tobacco Use   Smoking status: Former    Packs/day: 1.00    Years: 30.00    Total pack years: 30.00    Types: Cigarettes    Quit date: 08/05/1990    Years since quitting: 31.8   Smokeless tobacco: Never  Vaping Use   Vaping Use: Never used  Substance and Sexual Activity   Alcohol use: Yes    Alcohol/week: 2.0 standard drinks of alcohol    Types: 1 Glasses of wine, 1 Cans of beer per week    Comment: occassional   Drug use: No   Sexual activity: Not on file  Other Topics Concern   Not on file  Social History Narrative   Married 1964. 2 kids-son ([place]- in between businesses in 2021 looking at retring), daughter ([place]- had guillain barre syndrome). 5 grandkids.       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      Hobbies: walk 8 miles a day, neighborhood Animator   Social Determinants of [place] Strain: Low Risk  (05/06/2022)   Overall Financial Resource Strain (CARDIA)    Difficulty of Paying Living Expenses: Not hard at all  Food Insecurity: No Food Insecurity (05/06/2022)   Hunger Vital Sign    Worried About Running Out of Food in the Last Year: Never true    Ran Out of Food in the Last Year: Never true  Transportation Needs: No Transportation Needs (05/06/2022)   PRAPARE - Hydrologist (Medical): No    Lack of Transportation (Non-Medical): No  Physical  Activity: Sufficiently Active (05/06/2022)   Exercise Vital Sign    Days of Exercise per Week: 7 days    Minutes of Exercise per Session: 120 min  Stress: No Stress Concern Present (05/06/2022)   [place]    Feeling of Stress : Not at all  Social Connections: Moderately Integrated (05/06/2022)   Social Connection and Isolation Panel [NHANES]    Frequency of Communication with Friends and Family: More than three times a week  Frequency of Social Gatherings with Friends and Family: More than three times a week    Attends Religious Services: More than 4 times per year    Active Member of [employer] or Organizations: No    Attends Archivist Meetings: Never    Marital Status: Married  Human resources officer Violence: Not At Risk (05/06/2022)   Humiliation, Afraid, Rape, and Kick questionnaire    Fear of Current or Ex-Partner: No    Emotionally Abused: No    Physically Abused: No    Sexually Abused: No      Family History  Problem Relation Age of Onset   Diabetes Father    Stroke Father    Colon cancer Neg Hx    Rectal cancer Neg Hx    Stomach cancer Neg Hx     Vitals:   06/11/22 1520  BP: 100/60  Pulse: 72  SpO2: 100%  Weight: 57.2 kg (126 lb)   Wt Readings from Last 3 Encounters:  06/11/22 57.2 kg (126 lb)  05/09/22 56.7 kg (125 lb)  05/06/22 54.9 kg (121 lb)    PHYSICAL EXAM: General:  Thin appearing. No resp difficulty HEENT: normal Neck: supple. no JVD. Carotids 2+ bilat; no bruits. No lymphadenopathy or thryomegaly appreciated. Cor: PMI nondisplaced. Irregular rate & rhythm.3/6 AS S2 only mildly depressed 2/6 Martin Lungs: clear Abdomen: soft, nontender, nondistended. No hepatosplenomegaly. No bruits or masses. Good bowel sounds. Extremities: no cyanosis, clubbing, rash, edema Neuro: alert & orientedx3, cranial nerves grossly intact. moves all 4 extremities w/o difficulty. Affect pleasant  ASSESSMENT  & PLAN:  1. Chronic HFrEF, mixed ICM/Valvular Disease -10/2021 Echo EF 30-35% with mod-severe AS. EF was a little better but AS concerning. ? Amyloid.  - Cath 2021-2v CAD with total occlusion of mRCA with good L -> R collaterals and high grade lesion in distal AV-groove LCX with L->L collaterals  - Admit 7/23 for A/C HFrEF   - Echo 10/23 EF <20. RV sys function normal. RV size normal. Mod elevated PASP. LA severely dilated, RA mild-mod dilated. Mild-mod Martin and TR. Aortic valve gradient 15.8 mmHg. Severe aortic valve stenosis.  - NYHA II. Volume ok - On lasix PRN, hasn't needed any recently - Continue metoprolol 25 mg daily - Failed Entresto 2/2 hypotension - Continue Spiro 25 mg daily - Continue Farxiga 10 mg daily - Myeloma panel negative - Zio 9/23 3.5% PVCs - cMRI ordered - Difficult situation. EF seems depressed out of proportion to CAD (and AS). I was suspicious for PVC CM but PVC burden only 3.5%. Will get cMRI - Will d/w Structural Team to see if he is TAVR candidate - Will continue to attempt to optimize prior to discussing ICD  2. Aortic Stenosis  - 10/2021 Low flow low gradient mod-severe AS. Mean gradient 18 mmhg - Echo 10/23: Severe AS, mean gradient 15.8 mmHg - Myeloma panel negative - As above, d/w Structural Team to see if he is TAVR candidate  3. CAD -Cath- 2v CAD with total occlusion of mRCA with good L -> R collaterals and high grade lesion in distal AV-groove LCX with L->L collaterals -On statin, asa, bb -No s/s angina  4. A fib, permanent -Rate controlled on bb and dig.  -On coumadin.   - PVC 9/23 Zio - Atrial Fibrillation occurred continuously (100% burden), ranging from 36-170 bpm (avg of 76 bpm).   Glori Bickers, MD  3:48 PM

## 2022-06-11 NOTE — Patient Instructions (Signed)
Continue warfarin 1/2 tablet daily except 1 tablet on Tuesday, Thursdays and Saturdays Continue greens/salads -Recheck INR in 4 weeks

## 2022-06-11 NOTE — Patient Instructions (Signed)
Good to see you!   No medication changes  Labs done today, your results will be available in MyChart, we will contact you for abnormal readings.  Your physician recommends that you schedule a follow-up appointment in: 3-4 months ( February 2024) Call office in December 2023  to schedule an appointment  Your physician has requested that you have a cardiac MRI. Cardiac MRI uses a computer to create images of your heart as its beating, producing both still and moving pictures of your heart and major blood vessels. For further information please visit [email or web address] Please follow the instruction sheet given to you today for more information. We will call you once insurance approves  If you have any questions or concerns before your next appointment please send [place] a message through [place] or call our office at [phone].    TO LEAVE A MESSAGE FOR THE NURSE SELECT OPTION 2, PLEASE LEAVE A MESSAGE INCLUDING: YOUR NAME DATE OF BIRTH CALL BACK NUMBER REASON FOR CALL**this is important as we prioritize the call backs  YOU WILL RECEIVE A CALL BACK THE SAME DAY AS LONG AS YOU CALL BEFORE 4:00 PM  At the [facility], you and your health needs are our priority. As part of our continuing mission to provide you with exceptional heart care, we have created designated Provider Care Teams. These Care Teams include your primary Cardiologist (physician) and Advanced Practice Providers (APPs- Physician Assistants and Nurse Practitioners) who all work together to provide you with the care you need, when you need it.   You may see any of the following providers on your designated Care Team at your next follow up: Dr Glori Bickers Dr Loralie Champagne Dr. Roxana Hires, NP Lyda Jester, [place] [facility] [place], [place] Forestine Na, NP Audry Riles, PharmD   Please be sure to bring in all your medications bottles to every appointment.

## 2022-07-07 ENCOUNTER — Other Ambulatory Visit: Payer: Self-pay | Admitting: Family Medicine

## 2022-07-09 ENCOUNTER — Ambulatory Visit: Payer: Medicare Other | Attending: Cardiology | Admitting: *Deleted

## 2022-07-09 DIAGNOSIS — I480 Paroxysmal atrial fibrillation: Secondary | ICD-10-CM | POA: Diagnosis not present

## 2022-07-09 DIAGNOSIS — Z5181 Encounter for therapeutic drug level monitoring: Secondary | ICD-10-CM

## 2022-07-09 LAB — POCT INR: INR: 3 (ref 2.0–3.0)

## 2022-07-09 NOTE — Patient Instructions (Signed)
Continue warfarin 1/2 tablet daily except 1 tablet on Tuesday, Thursdays and Saturdays Continue greens/salads -Recheck INR in 4 weeks

## 2022-07-24 ENCOUNTER — Ambulatory Visit (INDEPENDENT_AMBULATORY_CARE_PROVIDER_SITE_OTHER): Payer: Medicare Other | Admitting: Family Medicine

## 2022-07-24 ENCOUNTER — Encounter: Payer: Self-pay | Admitting: Family Medicine

## 2022-07-24 VITALS — BP 92/60 | HR 88 | Temp 98.0°F | Ht 68.5 in | Wt 124.2 lb

## 2022-07-24 DIAGNOSIS — Z0000 Encounter for general adult medical examination without abnormal findings: Secondary | ICD-10-CM | POA: Diagnosis not present

## 2022-07-24 DIAGNOSIS — R739 Hyperglycemia, unspecified: Secondary | ICD-10-CM

## 2022-07-24 DIAGNOSIS — E782 Mixed hyperlipidemia: Secondary | ICD-10-CM | POA: Diagnosis not present

## 2022-07-24 DIAGNOSIS — I10 Essential (primary) hypertension: Secondary | ICD-10-CM

## 2022-07-24 LAB — COMPREHENSIVE METABOLIC PANEL
ALT: 32 U/L (ref 0–53)
AST: 36 U/L (ref 0–37)
Albumin: 3.7 g/dL (ref 3.5–5.2)
Alkaline Phosphatase: 66 U/L (ref 39–117)
BUN: 22 mg/dL (ref 6–23)
CO2: 29 mEq/L (ref 19–32)
Calcium: 9.4 mg/dL (ref 8.4–10.5)
Chloride: 102 mEq/L (ref 96–112)
Creatinine, Ser: 1.11 mg/dL (ref 0.40–1.50)
GFR: 61.78 mL/min (ref >60.00)
Glucose, Bld: 85 mg/dL (ref 70–99)
Potassium: 5.1 mEq/L (ref 3.5–5.1)
Sodium: 139 mEq/L (ref 135–145)
Total Bilirubin: 1.2 mg/dL (ref 0.2–1.2)
Total Protein: 6.7 g/dL (ref 6.0–8.3)

## 2022-07-24 LAB — CBC WITH DIFFERENTIAL/PLATELET
Basophils Absolute: 0.1 10*3/uL (ref 0.0–0.1)
Basophils Relative: 0.9 % (ref 0.0–3.0)
Eosinophils Absolute: 0.3 10*3/uL (ref 0.0–0.7)
Eosinophils Relative: 3.1 % (ref 0.0–5.0)
HCT: 38.9 % — ABNORMAL LOW (ref 39.0–52.0)
Hemoglobin: 13.2 g/dL (ref 13.0–17.0)
Lymphocytes Relative: 5.5 % — ABNORMAL LOW (ref 12.0–46.0)
Lymphs Abs: 0.5 10*3/uL — ABNORMAL LOW (ref 0.7–4.0)
MCHC: 34 g/dL (ref 30.0–36.0)
MCV: 96 fl (ref 78.0–100.0)
Monocytes Absolute: 0.7 10*3/uL (ref 0.1–1.0)
Monocytes Relative: 7.5 % (ref 3.0–12.0)
Neutro Abs: 7.8 10*3/uL — ABNORMAL HIGH (ref 1.4–7.7)
Neutrophils Relative %: 83 % — ABNORMAL HIGH (ref 43.0–77.0)
Platelets: 242 10*3/uL (ref 150.0–400.0)
RBC: 4.05 Mil/uL — ABNORMAL LOW (ref 4.22–5.81)
RDW: 14.2 % (ref 11.5–15.5)
WBC: 9.3 10*3/uL (ref 4.0–10.5)

## 2022-07-24 LAB — LIPID PANEL
Cholesterol: 101 mg/dL (ref 0–200)
HDL: 27.2 mg/dL — ABNORMAL LOW (ref >39.00)
LDL Cholesterol: 60 mg/dL (ref 0–99)
NonHDL: 73.48
Total CHOL/HDL Ratio: 4
Triglycerides: 67 mg/dL (ref 0.0–149.0)
VLDL: 13.4 mg/dL (ref 0.0–40.0)

## 2022-07-24 LAB — HEMOGLOBIN A1C: Hgb A1c MFr Bld: 6.2 % (ref 4.6–6.5)

## 2022-07-24 NOTE — Patient Instructions (Addendum)
-   trial neil med sinus rinse or similar nasal sinus irrigation. If this does not work within 2 weeks lets trial augmentin antibiotic or sooner if worsens (but I'm out of office next week) -also if swallowing doesn't improve- lets either do GI consult or swallow study  -recommend RSV shot at pharmacy  Please stop by lab before you go If you have mychart- we will send your results within 3 business days of [place] receiving them.  If you do not have mychart- we will call you about results within 5 business days of [place] receiving them.  *please also note that you will see labs on mychart as soon as they post. I will later go in and write notes on them- will say "notes from Dr. Durene Cal"   Recommended follow up: Return in about 6 months (around 01/23/2023) for followup or sooner if needed.Schedule b4 you leave.

## 2022-07-24 NOTE — Progress Notes (Signed)
Phone: [phone]   Subjective:  Patient presents today for their annual physical. Chief complaint-noted.   See problem oriented charting- ROS- full  review of systems was completed and negative  except for: sinus congestion, fatigue, occasional chills  The following were reviewed and entered/updated in epic: Past Medical History:  Diagnosis Date   Atrial fibrillation (HCC)    CAD (coronary artery disease)    BMS x 2 distal RCA 1998; DES x 2 mid to distal RCA 2005, had occluded distal circumflex with left to left collaterals as well   Cataract    Depression    Essential hypertension    Hyperlipidemia    Insomnia    Ischemic cardiomyopathy    Sinus congestion    Patient Active Problem List   Diagnosis Date Noted   Acute on chronic systolic CHF/EF 20% 03/29/2022    Priority: High   Severe aortic stenosis 03/29/2022    Priority: High   Atrial fibrillation (HCC) 09/29/2018    Priority: High   CAD (coronary artery disease) 02/17/2007    Priority: High   Aortic atherosclerosis (HCC) 12/17/2021    Priority: Medium    Binocular diplopia 12/31/2016    Priority: Medium    Hyperlipidemia 02/17/2007    Priority: Medium    Essential hypertension 02/17/2007    Priority: Medium    Chronic anticoagulation-Coumadin 03/29/2022    Priority: Low   Secondary hypercoagulable state (HCC) 07/20/2021    Priority: Low   Pulmonary nodule 11/12/2019    Priority: Low   Encounter for therapeutic drug monitoring 09/29/2018    Priority: Low   Chronic sinusitis 07/10/2017    Priority: Low   Headache 05/24/2014    Priority: Low   INSOMNIA, PERSISTENT 03/03/2007    Priority: Low   Depression 03/03/2007    Priority: Low   SYNDROME, CARPAL TUNNEL 02/17/2007    Priority: Low   Fatty liver 02/17/2007    Priority: Low   Right shoulder pain 09/02/2014    Priority: 1.   Past Surgical History:  Procedure Laterality Date   APPENDECTOMY     CATARACT EXTRACTION W/PHACO Right 07/08/2013    Procedure: CATARACT EXTRACTION PHACO AND INTRAOCULAR LENS PLACEMENT (IOC);  Surgeon: Gemma Payor, MD;  Location: [facility];  Service: Ophthalmology;  Laterality: Right;  CDE 26.34   CORONARY ANGIOPLASTY     stents x4   RIGHT/LEFT HEART CATH AND CORONARY ANGIOGRAPHY N/A 07/04/2020   Procedure: RIGHT/LEFT HEART CATH AND CORONARY ANGIOGRAPHY;  Surgeon: Dolores Patty, MD;  Location: [facility];  Service: Cardiovascular;  Laterality: N/A;   ROTATOR CUFF REPAIR Right    Undescended testicle surgery      Family History  Problem Relation Age of Onset   Diabetes Father    Stroke Father    Colon cancer Neg Hx    Rectal cancer Neg Hx    Stomach cancer Neg Hx     Medications- reviewed and updated Current Outpatient Medications  Medication Sig Dispense Refill   aspirin EC 81 MG tablet Take 1 tablet (81 mg total) by mouth daily with breakfast. Swallow whole. 120 tablet 3   atorvastatin (LIPITOR) 80 MG tablet Take 1 tablet by mouth once daily 90 tablet 0   dapagliflozin propanediol (FARXIGA) 10 MG TABS tablet Take 1 tablet (10 mg total) by mouth daily before breakfast. 30 tablet 3   digoxin (LANOXIN) 0.125 MG tablet TAKE 1 TABLET BY MOUTH ONCE DAILY 90 tablet 1   loratadine (CLARITIN) 10 MG tablet Take  10 mg by mouth daily as needed for allergies.     metoprolol succinate (TOPROL XL) 25 MG 24 hr tablet Take 1 tablet (25 mg total) by mouth daily. 90 tablet 3   spironolactone (ALDACTONE) 25 MG tablet Take 1 tablet (25 mg total) by mouth every morning. 90 tablet 3   warfarin (COUMADIN) 5 MG tablet TAKE ONE TABLET BY MOUTH DAILY EXCEPT ONE-HALF TABLET ON SUNDAY AND THURSDAY OR AS DIRECTED 75 tablet 1   No current facility-administered medications for this visit.    Allergies-reviewed and updated No Known Allergies  Social History   Social History Narrative   Married 1964. 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 5  grandkids.       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      Hobbies: walk 8 miles a day, neighborhood handyman   Objective  Objective:  BP 92/60 (BP Location: Left Arm, Patient Position: Sitting)   Temp 98 F (36.7 C) (Temporal)   Ht 5' 8.5" (1.74 m)   Wt 124 lb 3.2 oz (56.3 kg)   BMI 18.61 kg/m  Gen: NAD, resting comfortably HEENT: Mucous membranes are moist. Oropharynx normal other than some drainage in pharynx, nasal turbinates slightly swollen only mildly red. No sinus pressure Neck: no thyromegaly or cervical lymphadenopathy CV: irregularly irregular - murmur noted Lungs: CTAB no crackles, wheeze, rhonchi Abdomen: soft/nontender/nondistended/normal bowel sounds. No rebound or guarding.  Ext: no edema Skin: warm, dry Neuro: grossly normal, moves all extremities, PERRLA   Assessment and Plan  81 y.o. male presenting for annual physical.  Health Maintenance counseling: 1. Anticipatory guidance: Patient counseled regarding regular dental exams - not many teeth- declines dental follow up- wants to focus on heart, eye exams - yearly,  avoiding smoking and second hand smoke , limiting alcohol to 2 beverages per day - doesn't drink now, no illicit drugs .   2. Risk factor reduction:  Advised patient of need for regular exercise and diet rich and fruits and vegetables to reduce risk of heart attack and stroke.  Exercise- still walking 3-4 miles a day but much slower due to heart.  Diet/weight management-actually was told by cardiology wanted some weight gain.  Wt Readings from Last 3 Encounters:  07/24/22 124 lb 3.2 oz (56.3 kg)  06/11/22 126 lb (57.2 kg)  05/09/22 125 lb (56.7 kg)  3. Immunizations/screenings/ancillary studies- RSV at pharmacy recommended  Immunization History  Administered Date(s) Administered   Covid-19, Mrna,Vaccine(Spikevax)25yrs and older 05/17/2022   Fluad Quad(high Dose 65+) 05/10/2022   Influenza Whole 06/04/2007, 06/07/2009, 05/03/2010   Influenza, High Dose  Seasonal PF 05/10/2013, 05/25/2016, 04/30/2018, 05/25/2019, 05/03/2020, 05/28/2021   Influenza-Unspecified 05/14/2014, 06/21/2015, 05/13/2017, 04/30/2018   Moderna Covid-19 Vaccine Bivalent Booster 55yrs & up 08/09/2021   Moderna Sars-Covid-2 Vaccination 08/28/2019, 09/27/2019, 06/02/2020, 12/02/2020   Pneumococcal Conjugate-13 06/21/2015   Pneumococcal Polysaccharide-23 06/04/2007, 05/28/2021   Tdap 06/11/2012, 10/08/2021   Zoster Recombinat (Shingrix) 08/09/2021, 10/08/2021  4. Prostate cancer screening- past age based screening recommendations- did end up seeing urology for hematuria a while back and only finding was BPH- no further workup needed.   Lab Results  Component Value Date   PSA 0.58 06/03/2011   PSA 0.48 04/26/2010   PSA 0.51 05/26/2008   5. Colon cancer screening - 07/12/14-and was told no repeat due to age- had seen Dr. Arlyce Dice. No blood in stool or melena  6. Skin cancer screening- wants to hold off for now on  derm vsiits. advised regular sunscreen use. Denies worrisome, changing, or new skin lesions.  7. Smoking associated screening (lung cancer screening, AAA screen 65-75, UA)- Former smoker- 7.5 pack years quit in 1992. Prior hematuria workup with urology thought to be BPH related-will hold off on repeat urine with CHF issues- he wants to focus on that 8. STD screening - only active with wife  Status of chronic or acute concerns   # Sinus/ear congestion S:over 10 years of symptoms- ongoing congestion despite multiple attempts to improve it. Saline nasal spray helps some. Occasionally has used azithromycin- last time was in 2016-helped some for short period. Sitting with face in sun helps some.   Fullness in sinuses and through ear. Either one ear or the other gets congested. Blows out almost nothing.  -flonase with nosebleeds. Takes loratadine and helps some.  A/P: Chronic sinus congestion that worsens in the winter and has worsened this season- could be allergies but  doesn't tolerate flonase and already on loratadine. Considered singulair.  - trial neil med sinus rinse or similar nasal sinus irrigation. If this does not work within 2 weeks lets trial augmentin antibiotic or sooner if worsens (but I'm out of office next week)  # Dysphagia S:occasionally has trouble with foods but more frequently water. One time regurgitated a pill.  Feels like related to sinus drainage/irritation A/P: he agrees if doesn't improve with sinus rinses to let me know and we will refer to gastroenterology or do a barium swallow study potentially.   # CHF with EF 20% oct 2023 and severe aortic stenosis S:Medication:digoxin 0.125 mg daily, lasix 20 mg just as needed (not needing much), spironolactone 25 mg, metoprolol 25 mg XR , farxiga 10mg  -didn't tolerate entresto due to low BP -some consideration of TAVR- Dr. evaluating further -also pending cardiac MRI -ongoing shortness of breath, no edema, no weight gain -not too lightheaded with his BP on lower side- want to continue meds  A/P: ongoing issues- under excellent care of Dr. Gala Romney. Really wish he could have tolerated entresto but blood pressure simply too low- will continue current meds and follow up with cardiology -if candidate I think the TAVR would be very helpful  #CAD- medical management #A fib #hyperlipidemia with aortic atherosclerosis S: Medication:  aspirin 81 mg, coumadin (for a fib), metoprolol 25 mg for CAD as well as afib, atorvastatin 80 mg - no chest pain, significant shortness of breath but suspect related to CHF Lab Results  Component Value Date   CHOL 117 07/20/2021   HDL 45.00 07/20/2021   LDLCALC 60 07/20/2021   LDLDIRECT 60.0 07/10/2017   TRIG 64.0 07/20/2021   CHOLHDL 3 07/20/2021   A/P: CAD Controlled. Continue current medications.   Lipids -hopefully stable- update lipid panel today. Continue current meds for now A fib- appropriately anticoagulated and rate controlled- continue  current medicine  # Hyperglycemia/insulin resistance/prediabetes S:  Medication: none Lab Results  Component Value Date   HGBA1C 6.1 07/20/2021   HGBA1C 6.1 (H) 07/19/2020   HGBA1C 5.8 07/13/2019   A/P: farxiga may help- update a1c  Recommended follow up: Return in about 6 months (around 01/23/2023) for followup or sooner if needed.Schedule b4 you leave. Future Appointments  Date Time Provider Department Center  08/06/2022  8:45 AM [facility]  09/17/2022  3:00 PM [facility] 1 [facility]  10/01/2022  8:20 AM 10/03/2022, MD [facility]  05/12/2023  8:00 AM [facility]   Lab/Order associations:  fasting   ICD-10-CM   1. Preventative health care  Z00.00     2. Mixed hyperlipidemia  E78.2 CBC with Differential/Platelet    Comprehensive metabolic panel    Lipid panel    3. Essential hypertension  I10     4. Hyperglycemia  R73.9 HgB A1c      No orders of the defined types were placed in this encounter.   Return precautions advised.  Tana ConchStephen Juna Caban, MD

## 2022-07-25 ENCOUNTER — Other Ambulatory Visit (HOSPITAL_COMMUNITY): Payer: Self-pay | Admitting: Adult Health

## 2022-08-06 ENCOUNTER — Ambulatory Visit: Payer: Medicare Other | Attending: Internal Medicine | Admitting: *Deleted

## 2022-08-06 DIAGNOSIS — Z5181 Encounter for therapeutic drug level monitoring: Secondary | ICD-10-CM | POA: Diagnosis not present

## 2022-08-06 DIAGNOSIS — I480 Paroxysmal atrial fibrillation: Secondary | ICD-10-CM

## 2022-08-06 LAB — POCT INR: INR: 5.7 — AB (ref 2.0–3.0)

## 2022-08-06 NOTE — Patient Instructions (Addendum)
Hold warfarin tonight and tomorrow night then decrease dose to 1/2 tablet daily except 1 tablet on Saturdays Continue greens/salads -Recheck INR in 2 weeks Pt denies any signs of bleeding.  Bleeding and fall precautions discussed with pt and he verbalized understanding.

## 2022-08-20 ENCOUNTER — Ambulatory Visit: Payer: Medicare Other | Attending: Cardiology | Admitting: *Deleted

## 2022-08-20 DIAGNOSIS — I480 Paroxysmal atrial fibrillation: Secondary | ICD-10-CM | POA: Diagnosis not present

## 2022-08-20 DIAGNOSIS — Z5181 Encounter for therapeutic drug level monitoring: Secondary | ICD-10-CM

## 2022-08-20 LAB — POCT INR: INR: 1.9 — AB (ref 2.0–3.0)

## 2022-08-20 NOTE — Patient Instructions (Signed)
Increase warfarin to 1/2 tablet daily except 1 tablet on Tuesdays and Saturdays Continue greens/salads -Recheck INR in 4 weeks

## 2022-08-25 ENCOUNTER — Other Ambulatory Visit (HOSPITAL_COMMUNITY): Payer: Self-pay | Admitting: Adult Health

## 2022-08-31 ENCOUNTER — Other Ambulatory Visit: Payer: Self-pay | Admitting: Cardiology

## 2022-09-02 NOTE — Telephone Encounter (Signed)
Request for warfarin refill:  Last INR was 1.9 on 08/20/22 Next INR due 09/17/22 LOV was 06/11/22  D Bensimhon MD  Refill approved.

## 2022-09-12 ENCOUNTER — Encounter (HOSPITAL_COMMUNITY): Payer: Self-pay | Admitting: *Deleted

## 2022-09-16 ENCOUNTER — Telehealth (HOSPITAL_COMMUNITY): Payer: Self-pay | Admitting: *Deleted

## 2022-09-16 NOTE — Telephone Encounter (Signed)
Reaching out to patient to offer assistance regarding upcoming cardiac imaging study; pt's wife answered phone and verbalizes understanding of appt date/time, parking situation and where to check in, and verified current allergies; name and call back number provided for further questions should they arise  Gordy Clement RN [place] [facility] [phone] office [phone] cell  Patient's wife denies metal or claustrophobia.

## 2022-09-17 ENCOUNTER — Other Ambulatory Visit (HOSPITAL_COMMUNITY): Payer: Self-pay | Admitting: Internal Medicine

## 2022-09-17 ENCOUNTER — Ambulatory Visit (INDEPENDENT_AMBULATORY_CARE_PROVIDER_SITE_OTHER): Payer: Medicare Other | Admitting: *Deleted

## 2022-09-17 ENCOUNTER — Ambulatory Visit (HOSPITAL_COMMUNITY)
Admission: RE | Admit: 2022-09-17 | Discharge: 2022-09-17 | Disposition: A | Discharge status: Home / Self Care | Payer: Medicare Other | Source: Ambulatory Visit | Attending: Internal Medicine | Admitting: Internal Medicine

## 2022-09-17 DIAGNOSIS — I5022 Chronic systolic (congestive) heart failure: Secondary | ICD-10-CM

## 2022-09-17 DIAGNOSIS — I480 Paroxysmal atrial fibrillation: Secondary | ICD-10-CM | POA: Diagnosis not present

## 2022-09-17 DIAGNOSIS — Z5181 Encounter for therapeutic drug level monitoring: Secondary | ICD-10-CM

## 2022-09-17 LAB — POCT INR: INR: 2.3 (ref 2.0–3.0)

## 2022-09-17 MED ORDER — GADOBUTROL 1 MMOL/ML IV SOLN
5.0000 mL | Freq: Once | INTRAVENOUS | Status: AC | PRN
  Administered 2022-09-17: 5 mL via INTRAVENOUS

## 2022-09-17 NOTE — Patient Instructions (Signed)
Continue warfarin 1/2 tablet daily except 1 tablet on Tuesdays and Saturdays Continue greens/salads -Recheck INR in 4 weeks

## 2022-09-21 ENCOUNTER — Other Ambulatory Visit (HOSPITAL_COMMUNITY): Payer: Self-pay | Admitting: Adult Health

## 2022-09-30 NOTE — Progress Notes (Unsigned)
Cardiology Office Note  Date: 10/01/2022   ID: [name], [name] [birth year], MRN [id number]  History of Present Illness: Martin James is an 82 y.o. male last seen in August 2023 with interval follow-up in the advanced heart failure clinic.  I reviewed the chart and interval testing.  He reports NYHA class II-III dyspnea.  Typically gets about 10,000 steps through ADLs, walking in his driveway, and walking around the house during the day.  Intermittently more short of breath and has minor leg edema, he has used Lasix only twice this month.  Weight has been stable.  He does feel sometimes weak and short of breath when he is in the shower.  No palpitations or syncope.  No angina.  He recently underwent cardiac MRI as noted below, reviewed by Dr. Haroldine Laws with further follow-up planned.  I discussed these results with him today.  I also reviewed his interval cardiac monitor that showed only 3.5% PVC burden, unlikely to be contributing to his cardiomyopathy.  He is on Coumadin with follow-up in the anticoagulation clinic. Recent INR 2.3.  He does not report any spontaneous bleeding problems.  I reviewed his medications, he did not tolerate Entresto due to hypotension.  Physical Exam: VS:  BP 116/68   Pulse 76   Ht 5' 8.5" (1.74 m)   Wt 123 lb 12.8 oz (56.2 kg)   BMI 18.55 kg/m , BMI Body mass index is 18.55 kg/m.  Wt Readings from Last 3 Encounters:  10/01/22 123 lb 12.8 oz (56.2 kg)  07/24/22 124 lb 3.2 oz (56.3 kg)  06/11/22 126 lb (57.2 kg)    General: Patient appears comfortable at rest. HEENT: Conjunctiva and lids normal, oropharynx clear with moist mucosa. Neck: Supple, no elevated JVP or carotid bruits, no thyromegaly. Lungs: Clear to auscultation, nonlabored breathing at rest. Cardiac: Regular rate and rhythm, no S3 or significant systolic murmur, no pericardial rub. Abdomen: Soft, nontender, no hepatomegaly, bowel sounds present, no guarding or rebound. Extremities: No  pitting edema, distal pulses 2+. Skin: Warm and dry. Musculoskeletal: No kyphosis. Neuropsychiatric: Alert and oriented x3, affect grossly appropriate.  ECG:  An ECG dated 05/09/2022 was personally reviewed today and demonstrated:  Atrial fibrillation with IVCD, nonspecific ST-T changes, and intermittent PVCs versus aberrantly conducted beats.  Labwork: 06/11/2022: B Natriuretic Peptide 939.7 07/24/2022: ALT 32; AST 36; BUN 22; Creatinine, Ser 1.11; Hemoglobin 13.2; Platelets 242.0; Potassium 5.1; Sodium 139     Component Value Date/Time   CHOL 101 07/24/2022 1106   TRIG 67.0 07/24/2022 1106   HDL 27.20 (L) 07/24/2022 1106   CHOLHDL 4 07/24/2022 1106   VLDL 13.4 07/24/2022 1106   LDLCALC 60 07/24/2022 1106   LDLCALC 66 07/19/2020 0835   LDLDIRECT 60.0 07/10/2017 0905    Other Studies Reviewed Today:  Echocardiogram 05/09/2022:  1. Left ventricular ejection fraction, by estimation, is <20%. The left  ventricle has severely decreased function. The left ventricle has no  regional wall motion abnormalities. The left ventricular internal cavity  size was mildly dilated. Left  ventricular diastolic function could not be evaluated.   2. Right ventricular systolic function is normal. The right ventricular  size is normal. There is moderately elevated pulmonary artery systolic  pressure.   3. Left atrial size was severely dilated.   4. Right atrial size was mild to moderately dilated.   5. The mitral valve is degenerative. Mild to moderate mitral valve  regurgitation. No evidence of mitral stenosis.   6. Tricuspid  valve regurgitation is mild to moderate.   7. The aortic valve is heavily calcified with restricted leaflet  movement. There is probable low gradient/low-flow AS with dimensionless  index 0.19. The aortic valve is tricuspid. There is severe calcifcation of  the aortic valve. Aortic valve  regurgitation is mild to moderate. Severe aortic valve stenosis. Aortic  valve area, by  VTI measures 0.88 cm. Aortic valve mean gradient measures  15.8 mmHg. Aortic valve Vmax measures 2.74 m/s.   8. Aortic dilatation noted. There is mild dilatation of the ascending  aorta, measuring 38 mm.   9. The inferior vena cava is normal in size with greater than 50%  respiratory variability, suggesting right atrial pressure of 3 mmHg.   Cardiac monitor October 2023: 1. Atrial Fibrillation occurred continuously (100% burden), ranging from 36-170 bpm (avg of 76 bpm). 2. Intermittent Bundle Branch Block was present.  3. Three runs of nonsustained Ventricular Tachycardia runs occurred, the run with the fastest interval lasting 10 beats with a max rate of 218 bpm (avg 162 bpm); the run with the fastest interval was also the longest.  4. Occasional PVCs (3.5%, [id number]), PVC Couplets were occasional (1.4%, 9582)  Cardiac MRI 09/17/2022: IMPRESSION: 1. Right-sided lung nodules and RLL focal airspace disease. Consider CT chest for further evaluation.   2. Severely dilated LV with normal wall thickness, diffuse hypokinesis with EF 19%.   3.  Normal RV size with RV EF 32%.   4. Aortic stenosis looks moderate visually, would correlate with echo.   5. Moderate functional mitral regurgitation in setting of dilated left ventricle, regurgitant fraction 39% with regurgitant volume 20 cc.   6. Delayed enhancement images are suggestive of prior myocardial infarction in the RCA territory. No other areas of LGE.   7. Elevated extracellular volume percentage at 39%. This is higher than I would expect with just the inferior scar on delayed enhancement imaging. However, this does not look like cardiac amyloidosis.   Cardiomyopathy out of proportion to prior MI.  Assessment and Plan:  1.  CAD status post BMS x 2 to the distal RCA in 1998, DES x 2 to the mid to distal RCA in 2005, and known occlusion of the distal circumflex associated with left to left collaterals.  He does not report any active  angina at this time and continues on aspirin as well as Lipitor.  Most recent LDL was 60.  No changes were made today.  2.  HFrEF with mixed cardiomyopathy, LVEF 19% by recent cardiac MRI, also RV dysfunction with RV EF 32%.  Degree of cardiomyopathy is out of proportion to his CAD history.  He does have occasional PVCs, but only 3.5% burden and unlikely to be specific trigger for his cardiomyopathy.  Cardiac MRI not suggestive of infiltrative cardiomyopathy.  Myeloma panel negative.  He does have moderate to severe low-flow/low gradient aortic stenosis as well.  Plan is to continue medical therapy and arrange follow-up in the advanced heart failure clinic.  Currently on Toprol-XL, Lanoxin, Farxiga, Aldactone, and as needed Lasix.  He did not tolerate Entresto previously due to hypotension.  Recent lab work shows potassium 5.1 and creatinine 1.1.  3.  Moderate to severe, low-flow/low gradient aortic stenosis.  If candidate could be potentially considered for TAVR, although not entirely certain that this would necessarily lead to improvement in LVEF.  Cardiac MRI showed scar in the RCA distribution, so potentially remaining myocardium is viable however.  4.  Permanent atrial fibrillation with CHA2DS2-VASc score  of 4.  He remains on Coumadin for stroke prophylaxis with follow-up in the anticoagulation clinic.  Cardiac monitor from October 2023 showed average heart rate 76 bpm.  5.  PVCs, occasional by cardiac monitoring with 3.5% burden.  Unlikely to be specific trigger for cardiomyopathy.  Disposition:  Follow up in heart failure clinic and then with me in 3 months.  Signed, Satira Sark, MD, [facility]

## 2022-10-01 ENCOUNTER — Encounter: Payer: Self-pay | Admitting: Cardiology

## 2022-10-01 ENCOUNTER — Ambulatory Visit: Payer: Medicare Other | Attending: Cardiology | Admitting: Cardiology

## 2022-10-01 VITALS — BP 116/68 | HR 76 | Ht 68.5 in | Wt 123.8 lb

## 2022-10-01 DIAGNOSIS — I350 Nonrheumatic aortic (valve) stenosis: Secondary | ICD-10-CM | POA: Diagnosis not present

## 2022-10-01 DIAGNOSIS — I493 Ventricular premature depolarization: Secondary | ICD-10-CM

## 2022-10-01 DIAGNOSIS — I5020 Unspecified systolic (congestive) heart failure: Secondary | ICD-10-CM

## 2022-10-01 DIAGNOSIS — I25119 Atherosclerotic heart disease of native coronary artery with unspecified angina pectoris: Secondary | ICD-10-CM | POA: Diagnosis not present

## 2022-10-01 DIAGNOSIS — I4821 Permanent atrial fibrillation: Secondary | ICD-10-CM

## 2022-10-01 NOTE — Patient Instructions (Addendum)
Medication Instructions:  Your physician recommends that you continue on your current medications as directed. Please refer to the Current Medication list given to you today.  Labwork: none  Testing/Procedures: none  Follow-Up: Your physician recommends that you schedule a follow-up appointment in: 3 months  Any Other Special Instructions Will Be Listed Below (If Applicable). Need appointment in the Heart Failure Clinic  If you need a refill on your cardiac medications before your next appointment, please call your pharmacy.

## 2022-10-04 IMAGING — CT CT CHEST W/O CM
2 of 4 series · 15 of 36 positions shown, 18 images · non-contrast
Comparison: Radiograph of same day.

CLINICAL DATA: Shortness of breath.  Possible pneumothorax.

EXAM:
CT CHEST WITHOUT CONTRAST
TECHNIQUE: Multidetector CT imaging of the chest was performed following the
standard protocol without IV contrast.

[Series 2: routine chest without · axial · non-contrast · 0.72mm/px · z∈[+1197,+1481]mm · 12 of 168 slices shown, 15 images]
[im 13/168  mediastinal]
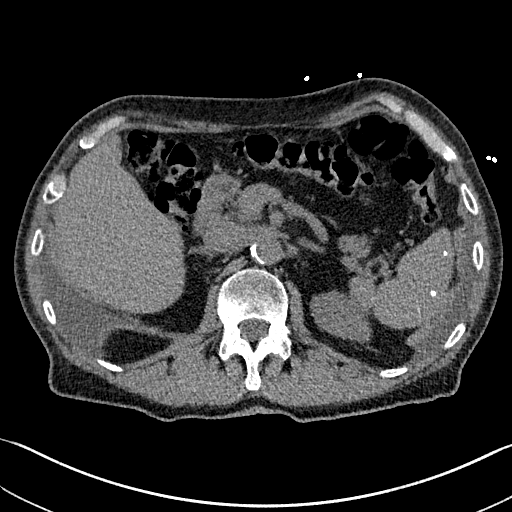
[im 13/168  lung]
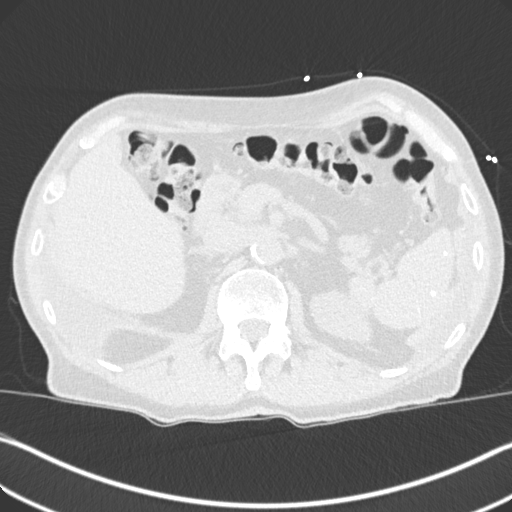
[im 26/168  lung]
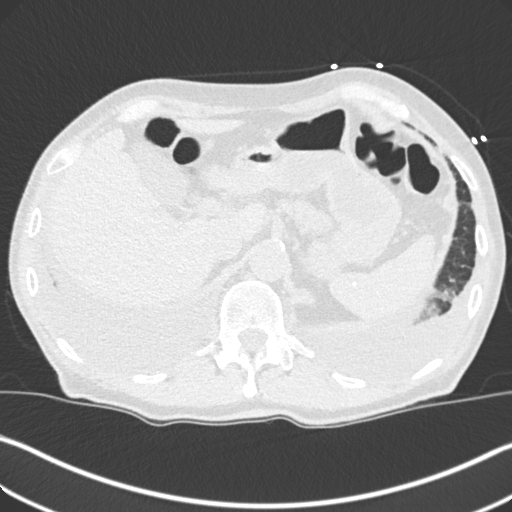
[im 39/168  lung]
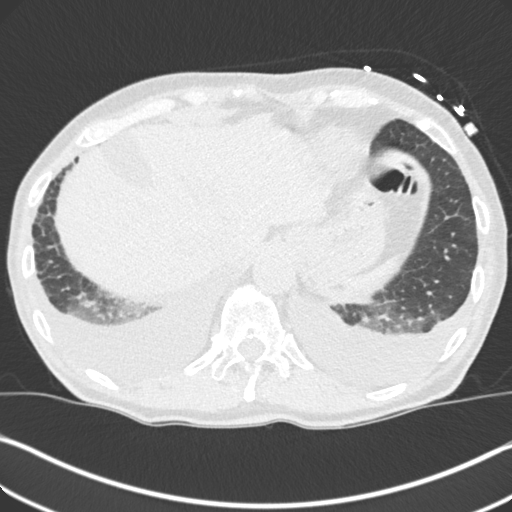
[im 52/168  lung]
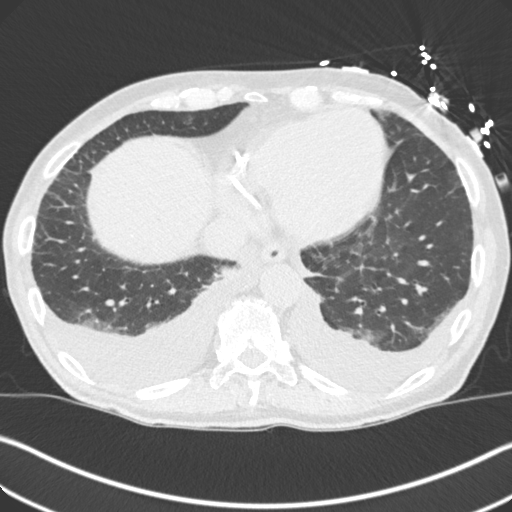
[im 65/168  mediastinal]
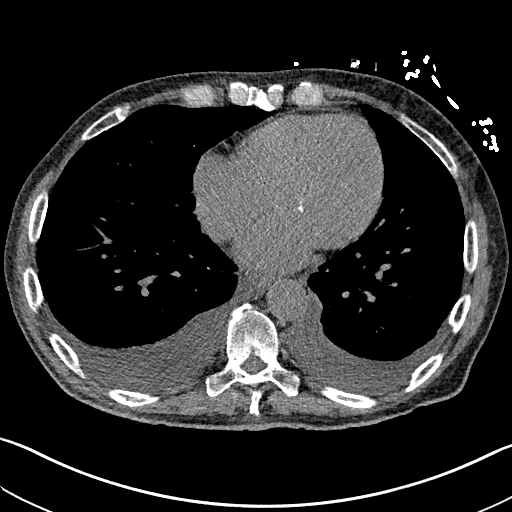
[im 65/168  lung]
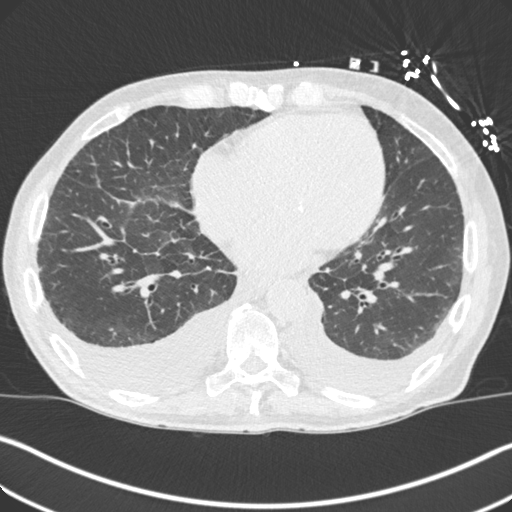
[im 78/168  lung]
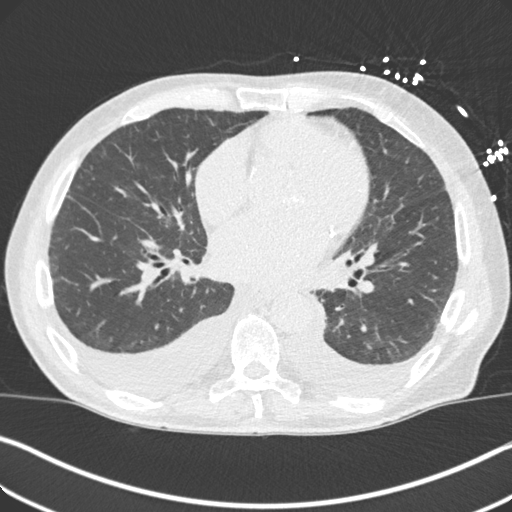
[im 90/168  lung]
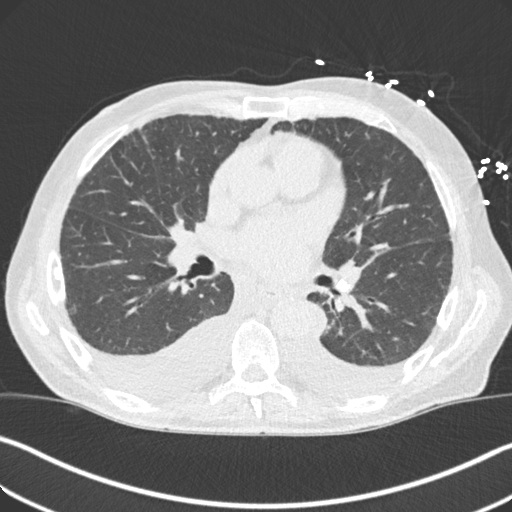
[im 103/168  lung]
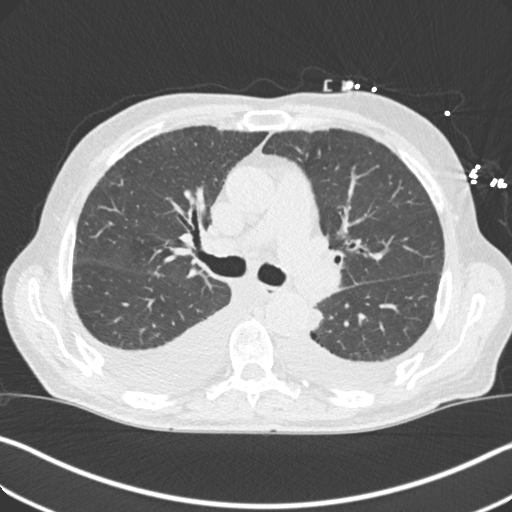
[im 116/168  mediastinal]
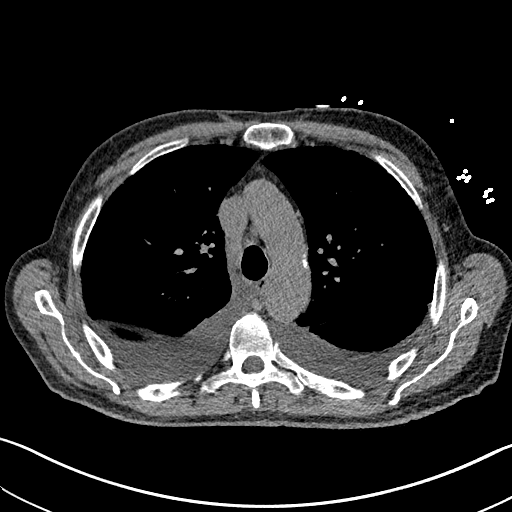
[im 116/168  lung]
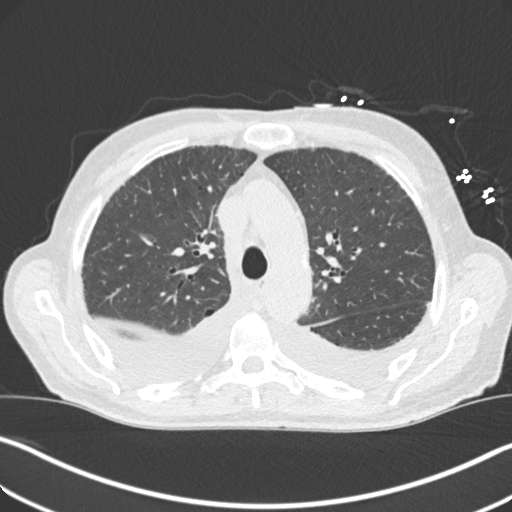
[im 129/168  lung]
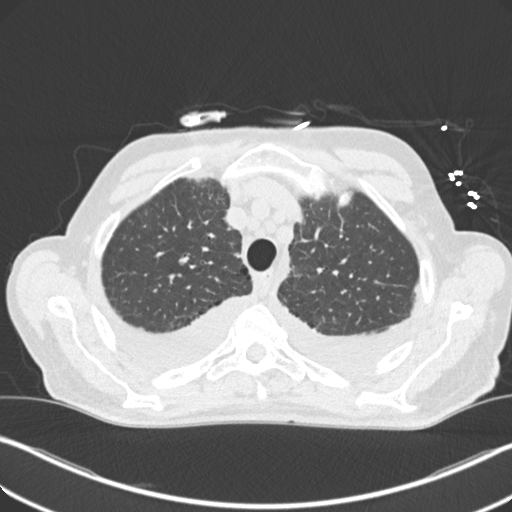
[im 142/168  lung]
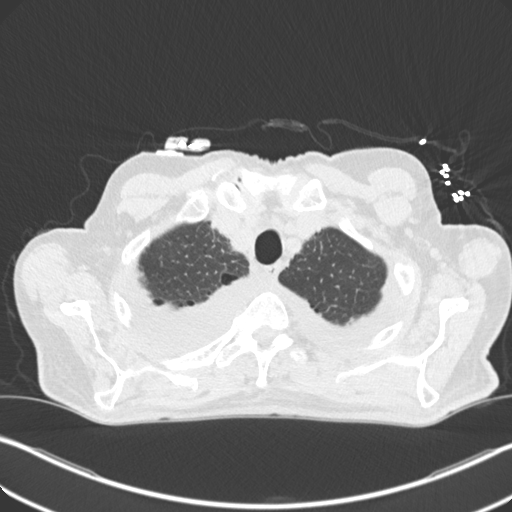
[im 155/168  lung]
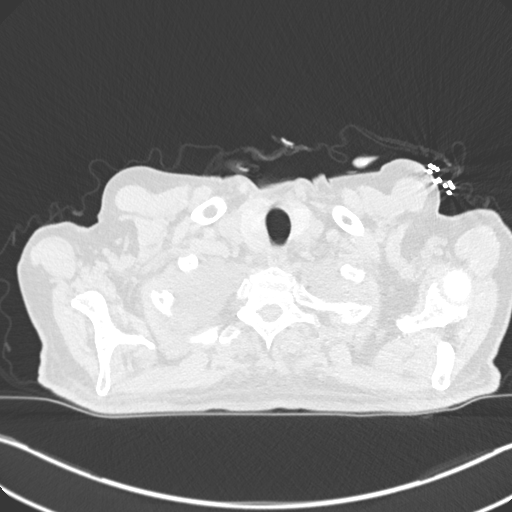

[Series 5: coronal · coronal · 0.68mm/px · 3 of 139 slices shown]
[im 28/139  lung]
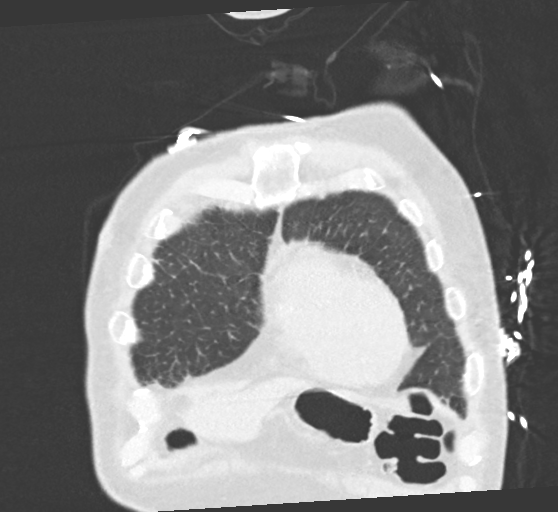
[im 56/139  lung]
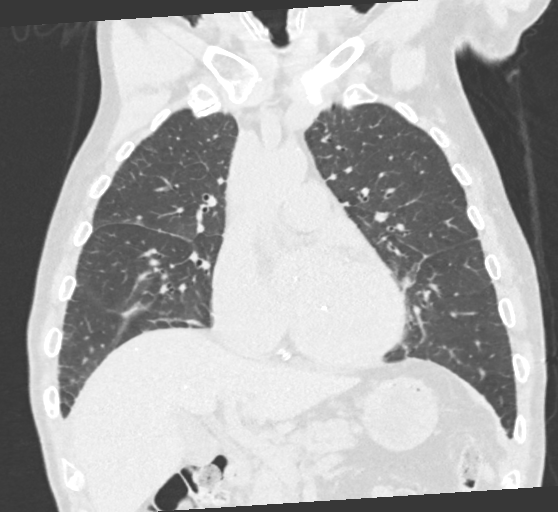
[im 83/139  lung]
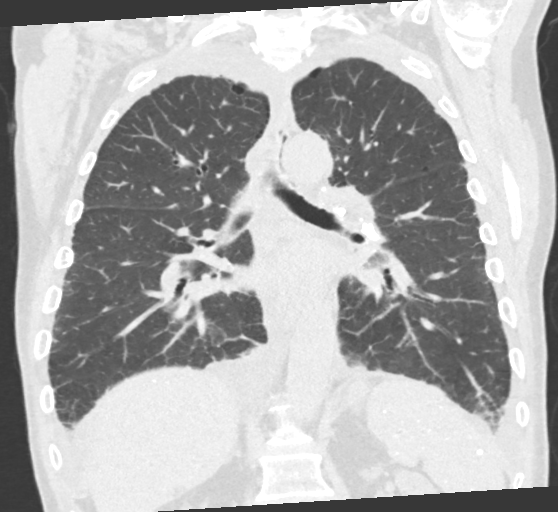

[15 of 36 positions shown; findings below may reference images not displayed]

FINDINGS: Cardiovascular: Atherosclerosis of thoracic aorta is noted without
aneurysm formation. Normal cardiac size. No pericardial effusion.
Coronary artery calcifications are noted.

Mediastinum/Nodes: Thyroid gland is unremarkable. Calcified left
hilar lymph nodes are noted consistent with prior granulomatous
disease. Esophagus is unremarkable.

Lungs/Pleura: No pneumothorax is noted. Moderate bilateral pleural
effusions are noted with minimal adjacent subsegmental atelectasis
in both lung bases. 7 mm subpleural nodule is noted anteriorly in
the right upper lobe best seen on image number 77 of series 4.

Upper Abdomen: Calcified splenic granulomata are noted.

Musculoskeletal: No chest wall mass or suspicious bone lesions
identified.
IMPRESSION: 1. Moderate bilateral pleural effusions are noted with minimal
adjacent subsegmental atelectasis in both lung bases. No
pneumothorax is noted.
2. Coronary artery calcifications are noted suggesting coronary
artery disease.
3. 7 mm subpleural nodule is noted anteriorly in the right upper
lobe. Non-contrast chest CT at 6-12 months is recommended. If the
nodule is stable at time of repeat CT, then future CT at 18-24
months (from today's scan) is considered optional for low-risk
patients, but is recommended for high-risk patients. This
recommendation follows the consensus statement: Guidelines for
Management of Incidental Pulmonary Nodules Detected on CT Images:

Aortic Atherosclerosis (C9VI4-4MH.H).

## 2022-10-07 ENCOUNTER — Other Ambulatory Visit: Payer: Self-pay | Admitting: Family Medicine

## 2022-10-11 NOTE — Progress Notes (Signed)
ADVANCED HF CLINIC NOTE  Primary Care: Dr Hunger  Primary Cardiologist: Dr Domenic Polite HF Cardiologist: Dr. Haroldine Laws  HPI: Martin James is a 82 y.o. with a history of HFrEF, ICM, CAD s/p BMS x2 distal RCA 1998; DES x2 mid to distal RCA 2005, occluded distal Lcx with L-L collaterals, permament atrial fibrillation on coumadin, HTN,  and depression.   Followed closely by Dr Domenic Polite. Intolerant to entresto due to hypotension. As he requested conservative management. Walking 10 miles a day until ~july. In July he went to [place] and was eating out at least once a day. After he returned from vacation he gradually developed orthopnea and fatigue. His wife convinced him to go to the ED.    Admitted to [facility] 03/29/22 with A/C HFrEF. BNP 1057. He had been complaint with medications. Diuresed with IV lasix and transitioned to lasix 20 mg daily. Toprol XL cut back. Discharge weight 128 pounds.   Follow up 11/23, NYHA II and volume OK. Concern for PVC CM, cMRI arranged. Zio showed 3.5% PVC burden.  cMRI (2/24) showed LVEF 19%, RVEF 32%, moderate to severe AI, moderate functional Martin  Today he returns for HF follow up with his wife. Overall feeling fair. Not sleeping well because he has new orthopnea. Started taking Lasix 2-3x/week.  He has SOB walking further distances on flat ground. Occasional dizziness, no syncope. Denies palpitations, CP, edema, or PND. Appetite fair, has early satiety. No fever or chills. Weight at home 118 pounds. Taking all medications. Retired Careers information officer. Drinking < 1L day, drinks Ensure.   Cardiac Testing   - cMRI (2/24): LVEF 19%, RVEF 32%, moderate to severe AI, moderate functional Martin w/ regurgitant fraction 39%, LGE in RCA territory   - Zio 2 week (10/23): continuous AF (36-170 bpm), 3 runs on NSVT, 3.5% PVC burden  - Echo (10/23): EF <20. RV sys function normal. RV size normal. Mod elevated PASP. LA severely dilated, RA mild-mod dilated. Mild-mod Martin and  TR. Aortic valve gradient 15.8 mmHg. Severe aortic valve stenosis.   - Echo (3/23): EF 30-35%. Aortic stenosis moderate to severe. Mean gradient 18 mmhg  - Echo (2021):EF 20-25% RV mildly reduced LA severely dilated, RA moderately dilated, mild-mod AS, mean gradient 11.5 mmhg.   - Echo (2020): EF 35-40% . Mild AS, LA severely dilated.   Cath (2021) Mid RCA to Dist RCA lesion is 100% stenosed. Mid Cx to Dist Cx lesion is 99% stenosed. Prox LAD to Mid LAD lesion is 40% stenosed. Prox Cx to Mid Cx lesion is 30% stenosed.  Ao = 109/60 LV = 113/5 RA =  1 RV = 25/1 PA = 24/7 (14) PCW = 7 Fick cardiac output/index = 4.7/2.6 PVR = 1.5 WU FA sat = 99% PA sat = 71%, 76%  Assessment: 1.  2v CAD with total occlusion of mRCA with good L -> R collaterals and high grade lesion in distal AV-groove LCX with L->L collaterals 2. EF 20% 3. Low filling pressures with normal cardiac output  Past Medical History:  Diagnosis Date   Atrial fibrillation ([place])    CAD (coronary artery disease)    BMS x 2 distal RCA 1998; DES x 2 mid to distal RCA 2005, had occluded distal circumflex with left to left collaterals as well   Cataract    Depression    Essential hypertension    Hyperlipidemia    Insomnia    Ischemic cardiomyopathy    Sinus congestion  Current Outpatient Medications  Medication Sig Dispense Refill   aspirin EC 81 MG tablet Take 1 tablet (81 mg total) by mouth daily with breakfast. Swallow whole. 120 tablet 3   atorvastatin (LIPITOR) 80 MG tablet Take 1 tablet by mouth once daily 90 tablet 0   digoxin (LANOXIN) 0.125 MG tablet TAKE 1 TABLET BY MOUTH ONCE DAILY 90 tablet 1   FARXIGA 10 MG TABS tablet TAKE 1 TABLET BY MOUTH ONCE DAILY BEFORE BREAKFAST 30 tablet 0   Furosemide (LASIX PO) Take 20 mg by mouth as needed.     loratadine (CLARITIN) 10 MG tablet Take 10 mg by mouth daily as needed for allergies.     metoprolol succinate (TOPROL XL) 25 MG 24 hr tablet Take 1 tablet (25 mg  total) by mouth daily. 90 tablet 3   spironolactone (ALDACTONE) 25 MG tablet Take 1 tablet (25 mg total) by mouth every morning. 90 tablet 3   warfarin (COUMADIN) 5 MG tablet Take 1/2 tablet daily except 1 tablet on Tuesdays and Saturdays or as directed 75 tablet 3   No current facility-administered medications for this encounter.   No Known Allergies  Social History   Socioeconomic History   Marital status: Married    Spouse name: Rory Swatek   Number of children: 2   Years of education: Not on file   Highest education level: Bachelor's degree (e.g., BA, AB, BS)  Occupational History   Occupation: retired    Comment: Ambulance person  Tobacco Use   Smoking status: Former    Packs/day: 1.00    Years: 30.00    Total pack years: 30.00    Types: Cigarettes    Quit date: 08/05/1990    Years since quitting: 32.2   Smokeless tobacco: Never  Vaping Use   Vaping Use: Never used  Substance and Sexual Activity   Alcohol use: Yes    Alcohol/week: 2.0 standard drinks of alcohol    Types: 1 Glasses of wine, 1 Cans of beer per week    Comment: occassional   Drug use: No   Sexual activity: Not on file  Other Topics Concern   Not on file  Social History Narrative   Married 1964. 2 kids-son ([place]- in between businesses in 2021 looking at retring), daughter ([place]- had guillain barre syndrome). 5 grandkids.       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      Hobbies: walk 8 miles a day, neighborhood Animator   Social Determinants of [place] Strain: Low Risk  (05/06/2022)   Overall Financial Resource Strain (CARDIA)    Difficulty of Paying Living Expenses: Not hard at all  Food Insecurity: No Food Insecurity (05/06/2022)   Hunger Vital Sign    Worried About Running Out of Food in the Last Year: Never true    Ran Out of Food in the Last Year: Never true  Transportation Needs: No Transportation Needs (05/06/2022)   PRAPARE - Hydrologist  (Medical): No    Lack of Transportation (Non-Medical): No  Physical Activity: Sufficiently Active (05/06/2022)   Exercise Vital Sign    Days of Exercise per Week: 7 days    Minutes of Exercise per Session: 120 min  Stress: No Stress Concern Present (05/06/2022)   [place]    Feeling of Stress : Not at all  Social Connections: Moderately Integrated (05/06/2022)   Social Connection and Isolation Panel [NHANES]  Frequency of Communication with Friends and Family: More than three times a week    Frequency of Social Gatherings with Friends and Family: More than three times a week    Attends Religious Services: More than 4 times per year    Active Member of [employer] or Organizations: No    Attends Archivist Meetings: Never    Marital Status: Married  Human resources officer Violence: Not At Risk (05/06/2022)   Humiliation, Afraid, Rape, and Kick questionnaire    Fear of Current or Ex-Partner: No    Emotionally Abused: No    Physically Abused: No    Sexually Abused: No   Family History  Problem Relation Age of Onset   Diabetes Father    Stroke Father    Colon cancer Neg Hx    Rectal cancer Neg Hx    Stomach cancer Neg Hx    BP 110/68   Pulse 95   Wt 55.3 kg (122 lb)   SpO2 98%   BMI 18.28 kg/m   Wt Readings from Last 3 Encounters:  10/14/22 55.3 kg (122 lb)  10/01/22 56.2 kg (123 lb 12.8 oz)  07/24/22 56.3 kg (124 lb 3.2 oz)    PHYSICAL EXAM: General:  NAD. No resp difficulty, walked into clinic, cachectic HEENT: temporal wasting Neck: Supple. No JVD. Carotids 2+ bilat; no bruits. No lymphadenopathy or thryomegaly appreciated. Cor: PMI nondisplaced. Irregular rate & rhythm. No rubs, gallops, 2/6 AS with audible S2, 2/6 Martin Lungs: Clear, diminished in bases. Abdomen: Soft, nontender, nondistended. No hepatosplenomegaly. No bruits or masses. Good bowel sounds. Extremities: No cyanosis, clubbing, rash,  edema Neuro: Alert & oriented x 3, cranial nerves grossly intact. Moves all 4 extremities w/o difficulty. Affect pleasant.  ReDs: 38%  ASSESSMENT & PLAN: 1. Chronic HFrEF, mixed ICM/Valvular Disease - Cath (2021): 2v CAD with total occlusion of mRCA with good L -> R collaterals and high grade lesion in distal AV-groove LCX with L->L collaterals  - Echo (3/23): EF 30-35% with mod-severe AS. EF was a little better, but AS concerning. ? Amyloid.  - Admit 7/23 for A/C HFrEF   - Zio (10/23) 3.5% PVCs - Multiple myeloma panel negative. - Echo (10/23): EF <20 %, normal RV size/function, mod elevated PASP. LA severely dilated, RA mild-mod dilated. Mild-mod Martin and TR. Aortic valve gradient 15.8 mmHg. Severe aortic valve stenosis.  - cMRI (2/24): LVEF 19%, RVEF 32%, moderate to severe AI, moderate functional Martin; CM out of proportion to CAD. - Worse NYHA III-IIIb symptoms recently. Weight down, but ReDs 38%. - Increase Lasix to 20 mg MWF - Continue Toprol XL 25 mg daily. - Continue spiro 25 mg daily - Continue Farxiga 10 mg daily - Failed Entresto 2/2 hypotension, consider low-dose losartan. - Difficult situation. EF seems depressed out of proportion to CAD (and AS). Suspicious for PVC CM but PVC burden only 3.5%.  - ? if TAVR would improve EF. Refer to Structural Heart team - We discussed ICD today, he is not overly excited at the idea of having a defibrillator & his advanced age may be prohibitive. - Labs today.  2. Aortic Stenosis  - Echo (3/23): Low flow low gradient mod-severe AS. Mean gradient 18 mmhg - Echo (10/23): Severe AS, mean gradient 15.8 mmHg - Myeloma panel negative - Moderate to severe on cMRI (2/24) - As above, refer to Structural Team to discuss TAVR candidacy  3. CAD - Cath (11/21): 2v CAD with total occlusion of mRCA with good L ->  R collaterals and high grade lesion in distal AV-groove LCX with L->L collaterals - No chest pain - Continue ASA, statin and beta  blocker.  4. A fib, permanent - Rate controlled on bb and dig. Check dig level. - On coumadin.  INR followed by Coumadin [place] (10/23): Atrial Fibrillation occurred continuously (100% burden), ranging from 36-170 bpm (avg of 76 bpm), PVC burden 3.5%  Follow up in 2 months with Dr. Haroldine Laws.  Rafael Bihari, FNP  2:16 PM

## 2022-10-14 ENCOUNTER — Encounter (HOSPITAL_COMMUNITY): Payer: Self-pay

## 2022-10-14 ENCOUNTER — Ambulatory Visit (HOSPITAL_COMMUNITY)
Admission: RE | Admit: 2022-10-14 | Discharge: 2022-10-14 | Disposition: A | Discharge status: Home / Self Care | Payer: Medicare Other | Source: Ambulatory Visit | Attending: Family Medicine | Admitting: Family Medicine

## 2022-10-14 VITALS — BP 110/68 | HR 95 | Wt 122.0 lb

## 2022-10-14 DIAGNOSIS — I4821 Permanent atrial fibrillation: Secondary | ICD-10-CM

## 2022-10-14 DIAGNOSIS — I25119 Atherosclerotic heart disease of native coronary artery with unspecified angina pectoris: Secondary | ICD-10-CM

## 2022-10-14 DIAGNOSIS — I5022 Chronic systolic (congestive) heart failure: Secondary | ICD-10-CM

## 2022-10-14 DIAGNOSIS — I350 Nonrheumatic aortic (valve) stenosis: Secondary | ICD-10-CM | POA: Diagnosis not present

## 2022-10-14 DIAGNOSIS — I5023 Acute on chronic systolic (congestive) heart failure: Secondary | ICD-10-CM

## 2022-10-14 LAB — BASIC METABOLIC PANEL
Anion gap: 9 (ref 5–15)
BUN: 25 mg/dL — ABNORMAL HIGH (ref 8–23)
CO2: 26 mmol/L (ref 22–32)
Calcium: 9.3 mg/dL (ref 8.9–10.3)
Chloride: 101 mmol/L (ref 98–111)
Creatinine, Ser: 1.24 mg/dL (ref 0.61–1.24)
GFR, Estimated: 58 mL/min — ABNORMAL LOW (ref >60)
Glucose, Bld: 119 mg/dL — ABNORMAL HIGH (ref 70–99)
Potassium: 4.5 mmol/L (ref 3.5–5.1)
Sodium: 136 mmol/L (ref 135–145)

## 2022-10-14 LAB — BRAIN NATRIURETIC PEPTIDE: B Natriuretic Peptide: 1190.5 pg/mL — ABNORMAL HIGH (ref 0.0–100.0)

## 2022-10-14 LAB — DIGOXIN LEVEL: Digoxin Level: 1.2 ng/mL (ref 0.8–2.0)

## 2022-10-14 MED ORDER — FUROSEMIDE 20 MG PO TABS: 20.0000 mg | ORAL_TABLET | ORAL | 5 refills | Status: DC

## 2022-10-14 NOTE — Patient Instructions (Addendum)
RedsClip done today.  Labs done today. We will contact you only if your labs are abnormal.  INCREASE Lasix to '20mg'$  (1 tablet) by mouth every Monday, wednesday and Friday.  No other medication changes were made. Please continue all current medications as prescribed.  Your physician recommends that you schedule a follow-up appointment in: 2 weeks for a lab only appointment at [facility] and in 2 months with Dr. Haroldine Laws  If you have any questions or concerns before your next appointment please send [place] a message through [place] or call our office at [phone].    TO LEAVE A MESSAGE FOR THE NURSE SELECT OPTION 2, PLEASE LEAVE A MESSAGE INCLUDING: YOUR NAME DATE OF BIRTH CALL BACK NUMBER REASON FOR CALL**this is important as we prioritize the call backs  YOU WILL RECEIVE A CALL BACK THE SAME DAY AS LONG AS YOU CALL BEFORE 4:00 PM   Do the following things EVERYDAY: Weigh yourself in the morning before breakfast. Write it down and keep it in a log. Take your medicines as prescribed Eat low salt foods--Limit salt (sodium) to 2000 mg per day.  Stay as active as you can everyday Limit all fluids for the day to less than 2 liters   At the [facility], you and your health needs are our priority. As part of our continuing mission to provide you with exceptional heart care, we have created designated Provider Care Teams. These Care Teams include your primary Cardiologist (physician) and Advanced Practice Providers (APPs- Physician Assistants and Nurse Practitioners) who all work together to provide you with the care you need, when you need it.   You may see any of the following providers on your designated Care Team at your next follow up: Dr Glori Bickers Dr Haynes Kerns, NP Lyda Jester, [place] Audry Riles, PharmD   Please be sure to bring in all your medications bottles to every appointment.

## 2022-10-14 NOTE — Progress Notes (Signed)
ReDS Vest / Clip - 10/14/22 1400       ReDS Vest / Clip   Station Marker C    Ruler Value 23.5    ReDS Value Range Moderate volume overload    ReDS Actual Value 38

## 2022-10-15 ENCOUNTER — Telehealth (HOSPITAL_COMMUNITY): Payer: Self-pay | Admitting: *Deleted

## 2022-10-15 ENCOUNTER — Ambulatory Visit: Payer: Medicare Other | Attending: Cardiology | Admitting: *Deleted

## 2022-10-15 DIAGNOSIS — I480 Paroxysmal atrial fibrillation: Secondary | ICD-10-CM | POA: Diagnosis not present

## 2022-10-15 DIAGNOSIS — Z5181 Encounter for therapeutic drug level monitoring: Secondary | ICD-10-CM | POA: Diagnosis not present

## 2022-10-15 DIAGNOSIS — Z79899 Other long term (current) drug therapy: Secondary | ICD-10-CM

## 2022-10-15 DIAGNOSIS — I5022 Chronic systolic (congestive) heart failure: Secondary | ICD-10-CM

## 2022-10-15 LAB — POCT INR: INR: 2.7 (ref 2.0–3.0)

## 2022-10-15 MED ORDER — POTASSIUM CHLORIDE ER 10 MEQ PO TBCR: 10.0000 meq | EXTENDED_RELEASE_TABLET | ORAL | 3 refills | Status: DC

## 2022-10-15 NOTE — Patient Instructions (Signed)
Continue warfarin 1/2 tablet daily except 1 tablet on Tuesdays and Saturdays Continue greens/salads -Recheck INR in 5 weeks

## 2022-10-15 NOTE — Addendum Note (Signed)
Addended by: Zada Girt B on: 10/15/2022 09:40 AM   Modules accepted: Orders

## 2022-10-15 NOTE — Telephone Encounter (Signed)
Called patients' wife with following lab results and instructions per Allena Katz, NP:  "BNP elevated. Take Lasix 20 mg daily with 10 KCL daily x 5 days, then change to Lasix 20 mg + 10 KCL on MWF thereafter. He has repeat labs arranged.  Dig level elevated. Please repeat at follow up labs at [facility] as a trough. Please call and ask him to hold digoxin the day of labs"  Wife verbalized understanding of same. She has "some" potassium in home, will send updated Rx to local pharmacy. Pt will hold Digoxin morning of repeat labs at [facility]. Asked wife to call [facility] at [phone] if any questions or concerns.

## 2022-10-18 ENCOUNTER — Encounter (HOSPITAL_COMMUNITY): Payer: Self-pay

## 2022-10-18 ENCOUNTER — Telehealth (HOSPITAL_COMMUNITY): Payer: Self-pay

## 2022-10-18 NOTE — Telephone Encounter (Signed)
I spoke to patient and he said someone has already called him with instructions. He states he is feeling better and sleeping  better.

## 2022-10-29 ENCOUNTER — Other Ambulatory Visit (HOSPITAL_COMMUNITY): Payer: Self-pay | Admitting: Adult Health

## 2022-11-04 ENCOUNTER — Telehealth: Payer: Self-pay | Admitting: Cardiology

## 2022-11-04 NOTE — Telephone Encounter (Signed)
Pt wife states pt has been SOB for a few months now. She states its been getting worse. He has doubled up on his lasix but wants some advice on what he should do.

## 2022-11-05 NOTE — Telephone Encounter (Signed)
Left message for them to call us back

## 2022-11-05 NOTE — Telephone Encounter (Signed)
I spoke to wife and indicated medication changes. She verbalized understanding. She will call us end of week and let us know how he is doing. He will be taking the 40mg  daily along with 63meq potassium.

## 2022-11-08 ENCOUNTER — Telehealth (HOSPITAL_COMMUNITY): Payer: Self-pay | Admitting: Cardiology

## 2022-11-08 DIAGNOSIS — I5022 Chronic systolic (congestive) heart failure: Secondary | ICD-10-CM

## 2022-11-08 MED ORDER — POTASSIUM CHLORIDE ER 10 MEQ PO TBCR: 20.0000 meq | EXTENDED_RELEASE_TABLET | Freq: Every day | ORAL | 3 refills | Status: DC

## 2022-11-08 MED ORDER — FUROSEMIDE 20 MG PO TABS: 40.0000 mg | ORAL_TABLET | Freq: Every day | ORAL | 5 refills | Status: DC

## 2022-11-08 NOTE — Telephone Encounter (Signed)
Patients wife called to report with the increase in lasix patient is doing a lot better , will need updated scripts sent to pharmacy  -scripts sent  Pts wife also had multiple concerns regarding difficulty swallowing, anxiety, and congestion. Advised to follow up with PCP as these concerns are not 100% cardiac in nature  Above reviewed with Prince Rome, NP Agreed with above

## 2022-11-15 ENCOUNTER — Encounter: Payer: Self-pay | Admitting: Cardiovascular Disease

## 2022-11-15 ENCOUNTER — Ambulatory Visit: Payer: Medicare Other | Attending: Cardiovascular Disease | Admitting: Cardiovascular Disease

## 2022-11-15 VITALS — BP 110/70 | HR 99 | Ht 68.5 in | Wt 115.0 lb

## 2022-11-15 DIAGNOSIS — I350 Nonrheumatic aortic (valve) stenosis: Secondary | ICD-10-CM | POA: Diagnosis not present

## 2022-11-15 DIAGNOSIS — Z01810 Encounter for preprocedural cardiovascular examination: Secondary | ICD-10-CM | POA: Diagnosis not present

## 2022-11-15 DIAGNOSIS — Z5181 Encounter for therapeutic drug level monitoring: Secondary | ICD-10-CM

## 2022-11-15 LAB — PROTIME-INR
INR: 4.3 — ABNORMAL HIGH (ref 0.9–1.2)
Prothrombin Time: 40.5 s — ABNORMAL HIGH (ref 9.1–12.0)

## 2022-11-15 NOTE — Progress Notes (Signed)
Cardiology Office Note:    Date:  11/15/2022   ID:  Martin James, Martin James December 01, 1940, MRN [id number]  PCP:  [name], MD   [facility] Providers Cardiologist:  Nona Dell, MD     Referring MD: [name], MD   Chief Complaint  Patient presents with   Aortic Stenosis    History of Present Illness:    Martin James is a 82 y.o. male presenting for evaluation of severe aortic stenosis, referred by Dr. Gala Romney and Prince Rome.  He is here with his wife today. He worked as an Barrister's clerk and he has 2 children, 5 grandchildren, and 3 great grandchildren.  He grew up in a house with 12 children.  He states that he has 3 living sisters and he is the only male sibling still living.  He lives independently with his wife in [place].  The patient's cardiac history dates back many years.  He has longstanding mixed ischemic and nonischemic cardiomyopathy.  He had bare-metal stent implantation in the right coronary artery in 1998.  He then underwent drug-eluting stent implantation in 2005 in the same vessel.  He has been noted to have chronic occlusion of the left circumflex with left to left collaterals.  He later underwent cardiac catheterization most recently in 2021 when his right coronary was also occluded with left-to-right collaterals.  The patient has developed severe cardiomyopathy with LVEF less than 30%.  He has been followed recently by the advanced heart failure team.  A cardiac MRI in February of this year showed an LVEF of 19%, RVEF of 32%, moderate to severe AI, and moderate functional MR.  The patient has had a significant worsening in his functional capacity over the past year.  He reports that a year ago he was walking 22,000 steps on a regular basis.  He has dropped to about only 09-2998 steps per day now.  He still tries to get out and do some light work in his yard.  He is able to get on his riding lawnmower and mow the lawn.  However, he has become  quite limited with shortness of breath.  He also complains of generalized fatigue.  He has problems with orthopnea and PND but has recently started taking additional Lasix in the evening and his sleep has been much better.  He has lost a lot of weight over the last 2 years with his weight in 2020 at 155 pounds and continuing to lose about 10 pounds per year, weighing 115 pounds now.  He states that one of the big reasons for weight loss is that he has been afraid to eat sodium.  He feels a pressure in his chest when he becomes short of breath but he really does not describe symptoms of exertional angina.  The patient has had poor dentition for many years.  He only has a few remaining teeth.  He has been reluctant to have his remaining partial teeth removed.  Past Medical History:  Diagnosis Date   Atrial fibrillation    CAD (coronary artery disease)    BMS x 2 distal RCA 1998; DES x 2 mid to distal RCA 2005, had occluded distal circumflex with left to left collaterals as well   Cataract    Depression    Essential hypertension    Hyperlipidemia    Insomnia    Ischemic cardiomyopathy    Sinus congestion     Past Surgical History:  Procedure Laterality Date  APPENDECTOMY     CATARACT EXTRACTION W/PHACO Right 07/08/2013   Procedure: CATARACT EXTRACTION PHACO AND INTRAOCULAR LENS PLACEMENT (IOC);  Surgeon: Gemma Payor, MD;  Location: [facility];  Service: Ophthalmology;  Laterality: Right;  CDE 26.34   CORONARY ANGIOPLASTY     stents x4   RIGHT/LEFT HEART CATH AND CORONARY ANGIOGRAPHY N/A 07/04/2020   Procedure: RIGHT/LEFT HEART CATH AND CORONARY ANGIOGRAPHY;  Surgeon: Dolores Patty, MD;  Location: [facility];  Service: Cardiovascular;  Laterality: N/A;   ROTATOR CUFF REPAIR Right    Undescended testicle surgery      Current Medications: Current Meds  Medication Sig   aspirin EC 81 MG tablet Take 1 tablet (81 mg total) by mouth daily with breakfast. Swallow whole.    atorvastatin (LIPITOR) 80 MG tablet Take 1 tablet by mouth once daily   dapagliflozin propanediol (FARXIGA) 10 MG TABS tablet TAKE 1 TABLET BY MOUTH ONCE DAILY BEFORE BREAKFAST   digoxin (LANOXIN) 0.125 MG tablet TAKE 1 TABLET BY MOUTH ONCE DAILY   furosemide (LASIX) 20 MG tablet Take 2 tablets (40 mg total) by mouth daily.   guaiFENesin (MUCINEX) 600 MG 12 hr tablet Take by mouth as needed.   loratadine (CLARITIN) 10 MG tablet Take 10 mg by mouth daily as needed for allergies.   metoprolol succinate (TOPROL XL) 25 MG 24 hr tablet Take 1 tablet (25 mg total) by mouth daily.   potassium chloride (KLOR-CON) 10 MEQ tablet Take 2 tablets (20 mEq total) by mouth daily.   spironolactone (ALDACTONE) 25 MG tablet Take 1 tablet (25 mg total) by mouth every morning.   warfarin (COUMADIN) 5 MG tablet Take 1/2 tablet daily except 1 tablet on Tuesdays and Saturdays or as directed     Allergies:   Patient has no allergy information on record.   Social History   Socioeconomic History   Marital status: Married    Spouse name: Amire Leazer   Number of children: 2   Years of education: Not on file   Highest education level: Bachelor's degree (e.g., BA, AB, BS)  Occupational History   Occupation: retired    Comment: Dentist  Tobacco Use   Smoking status: Former    Packs/day: 1.00    Years: 30.00    Additional pack years: 0.00    Total pack years: 30.00    Types: Cigarettes    Quit date: 08/05/1990    Years since quitting: 32.3   Smokeless tobacco: Never  Vaping Use   Vaping Use: Never used  Substance and Sexual Activity   Alcohol use: Yes    Alcohol/week: 2.0 standard drinks of alcohol    Types: 1 Glasses of wine, 1 Cans of beer per week    Comment: occassional   Drug use: No   Sexual activity: Not on file  Other Topics Concern   Not on file  Social History Narrative   Married 1964. 2 kids-son ([employer]- in between businesses in 2021 looking at retring), daughter ([place]- had  guillain barre syndrome). 5 grandkids.       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      Hobbies: walk 8 miles a day, neighborhood Gaffer   Social Determinants of Health   Financial Resource Strain: Low Risk  (05/06/2022)   Overall Financial Resource Strain (CARDIA)    Difficulty of Paying Living Expenses: Not hard at all  Food Insecurity: No Food Insecurity (05/06/2022)   Hunger Vital Sign    Worried About Running Out of Food  in the Last Year: Never true    Ran Out of Food in the Last Year: Never true  Transportation Needs: No Transportation Needs (05/06/2022)   PRAPARE - Administrator, Civil Service (Medical): No    Lack of Transportation (Non-Medical): No  Physical Activity: Sufficiently Active (05/06/2022)   Exercise Vital Sign    Days of Exercise per Week: 7 days    Minutes of Exercise per Session: 120 min  Stress: No Stress Concern Present (05/06/2022)   [employer] of Occupational Health - Occupational Stress Questionnaire    Feeling of Stress : Not at all  Social Connections: Moderately Integrated (05/06/2022)   Social Connection and Isolation Panel [NHANES]    Frequency of Communication with Friends and Family: More than three times a week    Frequency of Social Gatherings with Friends and Family: More than three times a week    Attends Religious Services: More than 4 times per year    Active Member of [employer] or Organizations: No    Attends Banker Meetings: Never    Marital Status: Married     Family History: The patient's family history includes Diabetes in his father; Stroke in his father. There is no history of Colon cancer, Rectal cancer, or Stomach cancer.  ROS:   Please see the history of present illness.    All other systems reviewed and are negative.  EKGs/Labs/Other Studies Reviewed:    The following studies were reviewed today: Cardiac Studies & Procedures   CARDIAC CATHETERIZATION  CARDIAC CATHETERIZATION 07/04/2020  Narrative   Mid RCA to Dist RCA lesion is 100% stenosed.  Mid Cx to Dist Cx lesion is 99% stenosed.  Prox LAD to Mid LAD lesion is 40% stenosed.  Prox Cx to Mid Cx lesion is 30% stenosed.  Findings:  Ao = 109/60 LV = 113/5 RA =  1 RV = 25/1 PA = 24/7 (14) PCW = 7 Fick cardiac output/index = 4.7/2.6 PVR = 1.5 WU FA sat = 99% PA sat = 71%, 76%  Assessment: 1.  2v CAD with total occlusion of mRCA with good L -> R collaterals and high grade lesion in distal AV-groove LCX with L->L collaterals 2. EF 20% 3. Low filling pressures with normal cardiac output  Plan/Discussion:  CAD haas progressed mildly since 2005 but overall not too much change and he is well collateralized. LV dysfunction well out of proportion to CAD. Will need cMRI to full evaluate.  Filling pressures low. May need to cut back lasix a bit.  Arvilla Meres, MD 10:34 AM  Findings Coronary Findings Diagnostic  Dominance: Right  Left Anterior Descending Prox LAD to Mid LAD lesion is 40% stenosed.  Left Circumflex Collaterals Dist Cx filled by collaterals from Prox Cx.  Prox Cx to Mid Cx lesion is 30% stenosed. Mid Cx to Dist Cx lesion is 99% stenosed.  Right Coronary Artery Mid RCA to Dist RCA lesion is 100% stenosed. The lesion was previously treated.  Right Posterior Descending Artery Collaterals RPDA filled by collaterals from Dist LAD.  Intervention  No interventions have been documented.   STRESS TESTS  NM MYOCAR MULTI W/SPECT W 10/15/2018  Narrative  Defect 1: There is a large defect of severe severity present in the basal inferoseptal, basal inferior, mid inferoseptal, mid inferior, apical anterior, apical septal and apical inferior location.  This is a high risk study.  Findings consistent with prior myocardial infarction. No ischemic zones.  Nuclear stress EF: 16%.  ECHOCARDIOGRAM  ECHOCARDIOGRAM COMPLETE 05/09/2022  Narrative ECHOCARDIOGRAM REPORT    Patient Name:   FORREST JAROSZEWSKI  Date of Exam: 05/09/2022 Medical Rec #:  [id number]    Height:       68.5 in Accession #:    [id number]   Weight:       121.0 lb Date of Birth:  Dec 08, 1940    BSA:          1.660 m Patient Age:    81 years     BP:           104/74 mmHg Patient Gender: M            HR:           105 bpm. Exam Location:  Outpatient  Procedure: 2D Echo, 3D Echo, Cardiac Doppler and Color Doppler  Indications:    I50.9 CHF ( HFrEF)  History:        Patient has prior history of Echocardiogram examinations, most recent 10/04/2021. CHF, CAD, Arrythmias:Atrial Fibrillation; Risk Factors:Hypertension, Dyslipidemia and Former Smoker. HFrEF (prior EF 30-35%), Low Flow- Low Gradient Aortic Stenosis (prior Mean ).  Sonographer:    Farrel Conners RDCS Referring Phys: [phone] AMY D CLEGG  IMPRESSIONS   1. Left ventricular ejection fraction, by estimation, is <20%. The left ventricle has severely decreased function. The left ventricle has no regional wall motion abnormalities. The left ventricular internal cavity size was mildly dilated. Left ventricular diastolic function could not be evaluated. 2. Right ventricular systolic function is normal. The right ventricular size is normal. There is moderately elevated pulmonary artery systolic pressure. 3. Left atrial size was severely dilated. 4. Right atrial size was mild to moderately dilated. 5. The mitral valve is degenerative. Mild to moderate mitral valve regurgitation. No evidence of mitral stenosis. 6. Tricuspid valve regurgitation is mild to moderate. 7. The aortic valve is heavily calcified with restricted leaflet movement. There is probable low gradient/low-flow AS with dimensionless index 0.19. The aortic valve is tricuspid. There is severe calcifcation of the aortic valve. Aortic valve regurgitation is mild to moderate. Severe aortic valve stenosis. Aortic valve area, by VTI measures 0.88 cm. Aortic valve mean gradient measures 15.8 mmHg. Aortic valve Vmax  measures 2.74 m/s. 8. Aortic dilatation noted. There is mild dilatation of the ascending aorta, measuring 38 mm. 9. The inferior vena cava is normal in size with greater than 50% respiratory variability, suggesting right atrial pressure of 3 mmHg.  FINDINGS Left Ventricle: Left ventricular ejection fraction, by estimation, is <20%. The left ventricle has severely decreased function. The left ventricle has no regional wall motion abnormalities. The left ventricular internal cavity size was mildly dilated. There is no left ventricular hypertrophy. Left ventricular diastolic function could not be evaluated due to atrial fibrillation. Left ventricular diastolic function could not be evaluated.  Right Ventricle: The right ventricular size is normal. No increase in right ventricular wall thickness. Right ventricular systolic function is normal. There is moderately elevated pulmonary artery systolic pressure. The tricuspid regurgitant velocity is 3.54 m/s, and with an assumed right atrial pressure of 3 mmHg, the estimated right ventricular systolic pressure is 53.1 mmHg.  Left Atrium: Left atrial size was severely dilated.  Right Atrium: Right atrial size was mild to moderately dilated.  Pericardium: There is no evidence of pericardial effusion.  Mitral Valve: The mitral valve is degenerative in appearance. Mild to moderate mitral valve regurgitation. No evidence of mitral valve stenosis.  Tricuspid Valve: The tricuspid valve is normal in  structure. Tricuspid valve regurgitation is mild to moderate. No evidence of tricuspid stenosis.  Aortic Valve: The aortic valve is heavily calcified with restricted leaflet movement. There is probable low gradient/low-flow AS with dimensionless index 0.19. The aortic valve is tricuspid. There is severe calcifcation of the aortic valve. Aortic valve regurgitation is mild to moderate. Aortic regurgitation PHT measures 368 msec. Severe aortic stenosis is present.  Aortic valve mean gradient measures 15.8 mmHg. Aortic valve peak gradient measures 30.1 mmHg. Aortic valve area, by VTI measures 0.88 cm.  Pulmonic Valve: The pulmonic valve was grossly normal. Pulmonic valve regurgitation is trivial. No evidence of pulmonic stenosis.  Aorta: The aortic root is normal in size and structure and aortic dilatation noted. There is mild dilatation of the ascending aorta, measuring 38 mm.  Venous: The inferior vena cava is normal in size with greater than 50% respiratory variability, suggesting right atrial pressure of 3 mmHg.  IAS/Shunts: No atrial level shunt detected by color flow Doppler.   LEFT VENTRICLE PLAX 2D LVIDd:         6.20 cm   Diastology LVIDs:         5.60 cm   LV e' medial:    4.50 cm/s LV PW:         0.90 cm   LV E/e' medial:  22.9 LV IVS:        0.80 cm   LV e' lateral:   6.38 cm/s LVOT diam:     2.60 cm   LV E/e' lateral: 16.1 LV SV:         47 LV SV Index:   28 LVOT Area:     5.31 cm  3D Volume EF: 3D EF:        33 % LV EDV:       210 ml LV ESV:       140 ml LV SV:        70 ml  RIGHT VENTRICLE RV Basal diam:  3.30 cm RV S prime:     9.70 cm/s TAPSE (M-mode): 1.1 cm  LEFT ATRIUM           Index        RIGHT ATRIUM           Index LA diam:      5.90 cm 3.55 cm/m   RA Area:     21.00 cm LA Vol (A4C): 97.8 ml 58.93 ml/m  RA Volume:   60.00 ml  36.15 ml/m AORTIC VALVE AV Area (Vmax):    0.89 cm AV Area (Vmean):   0.91 cm AV Area (VTI):     0.88 cm AV Vmax:           274.20 cm/s AV Vmean:          180.200 cm/s AV VTI:            0.533 m AV Peak Grad:      30.1 mmHg AV Mean Grad:      15.8 mmHg LVOT Vmax:         46.20 cm/s LVOT Vmean:        30.900 cm/s LVOT VTI:          0.088 m LVOT/AV VTI ratio: 0.17 AI PHT:            368 msec  AORTA Ao Root diam: 3.70 cm Ao Asc diam:  3.80 cm  MITRAL VALVE  TRICUSPID VALVE MV Area (PHT): cm           TR Peak grad:   50.1 mmHg MV Decel Time: 186 msec       TR Vmax:        354.00 cm/s MR Peak grad:   92.2 mmHg MR Mean grad:   52.0 mmHg    SHUNTS MR Vmax:        480.00 cm/s  Systemic VTI:  0.09 m MR Vmean:       336.0 cm/s   Systemic Diam: 2.60 cm MR PISA:        1.57 cm MR PISA Radius: 0.50 cm MV E velocity: 103.00 cm/s MV A velocity: 30.60 cm/s MV E/A ratio:  3.37  Arvilla Meres MD Electronically signed by Arvilla Meres MD Signature Date/Time: 05/09/2022/3:08:12 PM    Final    MONITORS  LONG TERM MONITOR (3-14 DAYS) 06/08/2022  Narrative Patch Wear Time:  13 days and 17 hours (2023-10-05T16:29:57-0400 to 2023-10-19T09:48:51-0400)  1. Atrial Fibrillation occurred continuously (100% burden), ranging from 36-170 bpm (avg of 76 bpm). 2. Intermittent Bundle Branch Block was present. 3. Three runs of nonsustained Ventricular Tachycardia runs occurred, the run with the fastest interval lasting 10 beats with a max rate of 218 bpm (avg 162 bpm); the run with the fastest interval was also the longest. 4. Occasional PVCs (3.5%, 46490), PVC Couplets were occasional (1.4%, 9582)  Arvilla Meres, MD 9:10 PM    CARDIAC MRI  MR CARDIAC MORPHOLOGY W WO CONTRAST 09/17/2022  Narrative CLINICAL DATA:  Cardiomyopathy of uncertain etiology  EXAM: CARDIAC MRI  TECHNIQUE: The patient was scanned on a 1.5 Tesla GE magnet. A dedicated cardiac coil was used. Functional imaging was done using Fiesta sequences. 2,3, and 4 chamber views were done to assess for RWMA's. Modified Simpson's rule using a short axis stack was used to calculate an ejection fraction on a dedicated work Research officer, [employer]. The patient received 8 cc of Gadavist. After 10 minutes inversion recovery sequences were used to assess for infiltration and scar tissue.  CONTRAST:  Gadavist 8 cc  FINDINGS: Images of lung fields show right-sided nodules along with a focus of right lower lobe airspace disease.  Severely dilated left ventricle with normal wall  thickness. Diffuse severe hypokinesis, LV EF 19%. No LV thrombus. Normal right ventricular size with RV EF 32%. Severe left atrial enlargement. Normal right atrium. Thickened aortic valve. The left and noncoronary cusps appear fused, there appears to be at least moderate aortic stenosis visually. Mild aortic insufficiency with regurgitant fraction 13%. Mitral regurgitant fraction 39% with regurgitant volume about 20 cc, moderate MR.  On delayed enhancement imaging, there is > 50% wall thickness subendocardial late gadolinium enhancement (LGE) in the basal to mid inferior wall.  MEASUREMENTS: MEASUREMENTS LVEDV 272 mL  LVEDVi 165 mL/m2  LVSV 51 mL  LVEF 19%  RVEDV 132 mL  RVEDVi 80 mL/m2 RVSV 43 mL  RVEF 32%  Aortic forward volume 31 mL  Aortic regurgitant fraction 12%  T1 1122, ECV 39%  IMPRESSION: 1. Right-sided lung nodules and RLL focal airspace disease. Consider CT chest for further evaluation.  2. Severely dilated LV with normal wall thickness, diffuse hypokinesis with EF 19%.  3.  Normal RV size with RV EF 32%.  4. Aortic stenosis looks moderate visually, would correlate with echo.  5. Moderate functional mitral regurgitation in setting of dilated left ventricle, regurgitant fraction 39% with regurgitant volume 20 cc.  6. Delayed enhancement images  are suggestive of prior myocardial infarction in the RCA territory. No other areas of LGE.  7. Elevated extracellular volume percentage at 39%. This is higher than I would expect with just the inferior scar on delayed enhancement imaging. However, this does not look like cardiac amyloidosis.  Cardiomyopathy out of proportion to prior MI.  Dalton Mclean   Electronically Signed By: Marca Ancona M.D. On: 09/17/2022 16:54          EKG:  EKG is ordered today.  The ekg ordered today demonstrates atrial fibrillation, heart rate 99 bpm, nonspecific IVCD, T wave abnormality consider anterolateral  ischemia  Recent Labs: 07/24/2022: ALT 32; Hemoglobin 13.2; Platelets 242.0 10/14/2022: B Natriuretic Peptide 1,190.5; BUN 25; Creatinine, Ser 1.24; Potassium 4.5; Sodium 136  Recent Lipid Panel    Component Value Date/Time   CHOL 101 07/24/2022 1106   TRIG 67.0 07/24/2022 1106   HDL 27.20 (L) 07/24/2022 1106   CHOLHDL 4 07/24/2022 1106   VLDL 13.4 07/24/2022 1106   LDLCALC 60 07/24/2022 1106   LDLCALC 66 07/19/2020 0835   LDLDIRECT 60.0 07/10/2017 0905     Risk Assessment/Calculations:    CHA2DS2-VASc Score = 5   This indicates a 7.2% annual risk of stroke. The patient's score is based upon: CHF History: 1 HTN History: 1 Diabetes History: 0 Stroke History: 0 Vascular Disease History: 1 Age Score: 2 Gender Score: 0               Physical Exam:    VS:  BP 110/70   Pulse 99   Ht 5' 8.5" (1.74 m)   Wt 115 lb (52.2 kg)   SpO2 99%   BMI 17.23 kg/m     Wt Readings from Last 3 Encounters:  11/15/22 115 lb (52.2 kg)  10/14/22 122 lb (55.3 kg)  10/01/22 123 lb 12.8 oz (56.2 kg)     GEN: Thin, elderly male in no distress HEENT: Normal except for poor dentition NECK: No JVD; No carotid bruits LYMPHATICS: No lymphadenopathy CARDIAC: Irregularly irregular, distant heart sounds, 2/6 systolic murmur best heard at the left lower sternal border and apex RESPIRATORY:  Clear to auscultation without rales, wheezing or rhonchi  ABDOMEN: Soft, non-tender, non-distended MUSCULOSKELETAL:  No edema; No deformity  SKIN: Warm and dry NEUROLOGIC:  Alert and oriented x 3 PSYCHIATRIC:  Normal affect   ASSESSMENT:    1. Nonrheumatic aortic valve stenosis   2. Encounter for therapeutic drug monitoring   3. Pre-procedural cardiovascular examination    PLAN:    In order of problems listed above:  The patient has chronic systolic heart failure with severely reduced LVEF, New York Heart Association functional class III symptoms of fatigue and exertional dyspnea.  He had, by his  personal report, an excellent functional capacity 1 year ago when he was able to walk over 20,000 steps in a day.  He has had a marked drop-off in his ability to do physical activities associated with worsening heart failure and possibly aortic stenosis.  I have personally reviewed his echo study which is suggestive of severe, stage D2 aortic stenosis.  The patient's LVEF is severely decreased.  His aortic valve is severely calcified and restricted with peak and mean gradients of 39 and 19 mmHg, respectively.  The dimensionless index is 0.15 and calculated aortic valve area is 0.75.  Today, we had extensive discussion about his treatment options.  His treatment is probably limited to catheter-based therapy such as TAVR or palliative medical therapy.  He  is not in physical condition to undergo any type of major surgery.  The patient expresses very good insight into his health condition with realistic expectations.  Concerns about his comorbidities include the degree of LV dysfunction and extent of heart failure, 40 pound weight loss over the last 4 years with poor nutritional status, and extent of CAD.  We discussed all this at length.  He remains functionally independent and is doing is much as he can even working in the yard still.  He understands the importance of maintaining his weight and he will try to supplement his diet as much as he can.  I discussed his case with Dr. Gala Romney today.  If we are going to explore TAVR, the patient understands that he needs some preprocedural studies to determine whether he would be a candidate.  I have recommended repeat right and left heart cath to best assess his hemodynamics and see if he has any progressive CAD.  In the past he is demonstrated total occlusion of the circumflex and RCA, but has not had flow obstructive disease in the LAD. I have reviewed the risks, indications, and alternatives to cardiac catheterization, possible angioplasty, and stenting with the  patient. Risks include but are not limited to bleeding, infection, vascular injury, stroke, myocardial infection, arrhythmia, kidney injury, radiation-related injury in the case of prolonged fluoroscopy use, emergency cardiac surgery, and death. The patient understands the risks of serious complication is 1-2 in 1000 with diagnostic cardiac cath and 1-2% or less with angioplasty/stenting.  In addition to cardiac catheterization, he will need gated CTA studies of the heart and a CTA of the chest, abdomen, and pelvis to evaluate his access for TAVR.  Once his studies are completed, we will have multidisciplinary review of his case and he will be referred for a formal cardiac surgical consultation if he is felt to be a potential TAVR candidate.  He also understands that he would need his remaining teeth extracted prior to TAVR.  He has extremely poor dentition with only a few remaining partial teeth.  He is somewhat reluctant to have this done, and I think it is entirely reasonable to wait and make sure that he would be a candidate for TAVR, before referring him for dental extraction.   Shared Decision Making/Informed Consent The risks [stroke (1 in 1000), death (1 in 1000), kidney failure [usually temporary] (1 in 500), bleeding (1 in 200), allergic reaction [possibly serious] (1 in 200)], benefits (diagnostic support and management of coronary artery disease) and alternatives of a cardiac catheterization were discussed in detail with Mr. Whitlock and he is willing to proceed.    Medication Adjustments/Labs and Tests Ordered: Current medicines are reviewed at length with the patient today.  Concerns regarding medicines are outlined above.  Orders Placed This Encounter  Procedures   CBC   Basic metabolic panel   Protime-INR   EKG 12-Lead   No orders of the defined types were placed in this encounter.   Patient Instructions  Medication Instructions:  Your physician recommends that you continue on your  current medications as directed. Please refer to the Current Medication list given to you today.  *If you need a refill on your cardiac medications before your next appointment, please call your pharmacy*  Lab Work: CBC, BMET, PT/INR today If you have labs (blood work) drawn today and your tests are completely normal, you will receive your results only by: MyChart Message (if you have MyChart) OR A paper copy in the  mail If you have any lab test that is abnormal or we need to change your treatment, we will call you to review the results.  Testing/Procedures: TAVR CT's (you will be called to schedule)  R & L heart catheterization Your physician has requested that you have a cardiac catheterization. Cardiac catheterization is used to diagnose and/or treat various heart conditions. Doctors may recommend this procedure for a number of different reasons. The most common reason is to evaluate chest pain. Chest pain can be a symptom of coronary artery disease (CAD), and cardiac catheterization can show whether plaque is narrowing or blocking your heart's arteries. This procedure is also used to evaluate the valves, as well as measure the blood flow and oxygen levels in different parts of your heart. For further information please visit [email or web address] Please follow instruction sheet, as given.  Follow-Up: At [facility], you and your health needs are our priority.  As part of our continuing mission to provide you with exceptional heart care, we have created designated Provider Care Teams.  These Care Teams include your primary Cardiologist (physician) and Advanced Practice Providers (APPs -  Physician Assistants and Nurse Practitioners) who all work together to provide you with the care you need, when you need it.  Your next appointment:   Structural team will follow-up  Provider:   Tonny Bollman, MD   Other Instructions  **Please drink 2-3 Ensure/Boost supplements per day to  maintain current weight**       Cardiac/Peripheral Catheterization   You are scheduled for a Cardiac Catheterization on Wednesday, April 17 with Dr. Tonny Bollman.  1. Please arrive at the [facility] (Main Entrance A) at [facility]: [address] [place], [place] [address] at 11:00 AM (This time is two hours before your procedure to ensure your preparation). Free valet parking service is available. You will check in at ADMITTING. The support person will be asked to wait in the waiting room.  It is OK to have someone drop you off and come back when you are ready to be discharged.        Special note: Every effort is made to have your procedure done on time. Please understand that emergencies sometimes delay scheduled procedures.  2. Diet: Do not eat solid foods after midnight.  You may have clear liquids until 5 AM the day of the procedure.  3. Labs: TODAY  4. Medication instructions in preparation for your procedure:   Contrast Allergy: No  DO NOT TAKE Coumadin/Warfarin for 4 days prior to procedure (last dose Friday 4/12, may resume day after)  DO NOT TAKE Furosemide, Potassium, or Spironolactone the day of procedure  On the morning of your procedure, take Aspirin 81 mg and any morning medicines NOT listed above.  You may use sips of water.  5. Plan to go home the same day, you will only stay overnight if medically necessary. 6. You MUST have a responsible adult to drive you home. 7. An adult MUST be with you the first 24 hours after you arrive home. 8. Bring a current list of your medications, and the last time and date medication taken. 9. Bring ID and current insurance cards. 10.Please wear clothes that are easy to get on and off and wear slip-on shoes.  Thank you for allowing [place] to care for you!   -- [facility]    Signed, Tonny Bollman, MD  11/15/2022 11:10 AM    [facility]

## 2022-11-15 NOTE — Progress Notes (Signed)
Pre Surgical Assessment: 5 M Walk Test  3M=16.72ft  5 Meter Walk Test- trial 1: 4.85 seconds 5 Meter Walk Test- trial 2: 5.45 seconds 5 Meter Walk Test- trial 3: 4.59 seconds 5 Meter Walk Test Average: 4.96 seconds

## 2022-11-15 NOTE — H&P (View-Only) (Signed)
Cardiology Office Note:    Date:  11/15/2022   ID:  Martin James, DOB 03/26/1941, MRN [id number]  PCP:  Hunter, Stephen O, MD   [facility] Providers Cardiologist:  Samuel McDowell, MD     Referring MD: Hunter, Stephen O, MD   Chief Complaint  Patient presents with   Aortic Stenosis    History of Present Illness:    Martin James is a 82 y.o. male presenting for evaluation of severe aortic stenosis, referred by Dr. Bensimhon and Jessica Milford.  He is here with his wife today. He worked as an airplane mechanic and he has 2 children, 5 grandchildren, and 3 great grandchildren.  He grew up in a house with 12 children.  He states that he has 3 living sisters and he is the only male sibling still living.  He lives independently with his wife in [place].  The patient's cardiac history dates back many years.  He has longstanding mixed ischemic and nonischemic cardiomyopathy.  He had bare-metal stent implantation in the right coronary artery in 1998.  He then underwent drug-eluting stent implantation in 2005 in the same vessel.  He has been noted to have chronic occlusion of the left circumflex with left to left collaterals.  He later underwent cardiac catheterization most recently in 2021 when his right coronary was also occluded with left-to-right collaterals.  The patient has developed severe cardiomyopathy with LVEF less than 30%.  He has been followed recently by the advanced heart failure team.  A cardiac MRI in February of this year showed an LVEF of 19%, RVEF of 32%, moderate to severe AI, and moderate functional MR.  The patient has had a significant worsening in his functional capacity over the past year.  He reports that a year ago he was walking 22,000 steps on a regular basis.  He has dropped to about only 09-2998 steps per day now.  He still tries to get out and do some light work in his yard.  He is able to get on his riding lawnmower and mow the lawn.  However, he has become  quite limited with shortness of breath.  He also complains of generalized fatigue.  He has problems with orthopnea and PND but has recently started taking additional Lasix in the evening and his sleep has been much better.  He has lost a lot of weight over the last 2 years with his weight in 2020 at 155 pounds and continuing to lose about 10 pounds per year, weighing 115 pounds now.  He states that one of the big reasons for weight loss is that he has been afraid to eat sodium.  He feels a pressure in his chest when he becomes short of breath but he really does not describe symptoms of exertional angina.  The patient has had poor dentition for many years.  He only has a few remaining teeth.  He has been reluctant to have his remaining partial teeth removed.  Past Medical History:  Diagnosis Date   Atrial fibrillation    CAD (coronary artery disease)    BMS x 2 distal RCA 1998; DES x 2 mid to distal RCA 2005, had occluded distal circumflex with left to left collaterals as well   Cataract    Depression    Essential hypertension    Hyperlipidemia    Insomnia    Ischemic cardiomyopathy    Sinus congestion     Past Surgical History:  Procedure Laterality Date     APPENDECTOMY     CATARACT EXTRACTION W/PHACO Right 07/08/2013   Procedure: CATARACT EXTRACTION PHACO AND INTRAOCULAR LENS PLACEMENT (IOC);  Surgeon: Kerry Hunt, MD;  Location: [facility];  Service: Ophthalmology;  Laterality: Right;  CDE 26.34   CORONARY ANGIOPLASTY     stents x4   RIGHT/LEFT HEART CATH AND CORONARY ANGIOGRAPHY N/A 07/04/2020   Procedure: RIGHT/LEFT HEART CATH AND CORONARY ANGIOGRAPHY;  Surgeon: Bensimhon, Daniel R, MD;  Location: [facility];  Service: Cardiovascular;  Laterality: N/A;   ROTATOR CUFF REPAIR Right    Undescended testicle surgery      Current Medications: Current Meds  Medication Sig   aspirin EC 81 MG tablet Take 1 tablet (81 mg total) by mouth daily with breakfast. Swallow whole.    atorvastatin (LIPITOR) 80 MG tablet Take 1 tablet by mouth once daily   dapagliflozin propanediol (FARXIGA) 10 MG TABS tablet TAKE 1 TABLET BY MOUTH ONCE DAILY BEFORE BREAKFAST   digoxin (LANOXIN) 0.125 MG tablet TAKE 1 TABLET BY MOUTH ONCE DAILY   furosemide (LASIX) 20 MG tablet Take 2 tablets (40 mg total) by mouth daily.   guaiFENesin (MUCINEX) 600 MG 12 hr tablet Take by mouth as needed.   loratadine (CLARITIN) 10 MG tablet Take 10 mg by mouth daily as needed for allergies.   metoprolol succinate (TOPROL XL) 25 MG 24 hr tablet Take 1 tablet (25 mg total) by mouth daily.   potassium chloride (KLOR-CON) 10 MEQ tablet Take 2 tablets (20 mEq total) by mouth daily.   spironolactone (ALDACTONE) 25 MG tablet Take 1 tablet (25 mg total) by mouth every morning.   warfarin (COUMADIN) 5 MG tablet Take 1/2 tablet daily except 1 tablet on Tuesdays and Saturdays or as directed     Allergies:   Patient has no allergy information on record.   Social History   Socioeconomic History   Marital status: Married    Spouse name: Barbara Canterbury   Number of children: 2   Years of education: Not on file   Highest education level: Bachelor's degree (e.g., BA, AB, BS)  Occupational History   Occupation: retired    Comment: commerical aircraft mechanic  Tobacco Use   Smoking status: Former    Packs/day: 1.00    Years: 30.00    Additional pack years: 0.00    Total pack years: 30.00    Types: Cigarettes    Quit date: 08/05/1990    Years since quitting: 32.3   Smokeless tobacco: Never  Vaping Use   Vaping Use: Never used  Substance and Sexual Activity   Alcohol use: Yes    Alcohol/week: 2.0 standard drinks of alcohol    Types: 1 Glasses of wine, 1 Cans of beer per week    Comment: occassional   Drug use: No   Sexual activity: Not on file  Other Topics Concern   Not on file  Social History Narrative   Married 1964. 2 kids-son ([place]- in between businesses in 2021 looking at retring), daughter ([place]- had  guillain barre syndrome). 5 grandkids.       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      Hobbies: walk 8 miles a day, neighborhood handyman   Social Determinants of Health   Financial Resource Strain: Low Risk  (05/06/2022)   Overall Financial Resource Strain (CARDIA)    Difficulty of Paying Living Expenses: Not hard at all  Food Insecurity: No Food Insecurity (05/06/2022)   Hunger Vital Sign    Worried About Running Out of Food   in the Last Year: Never true    Ran Out of Food in the Last Year: Never true  Transportation Needs: No Transportation Needs (05/06/2022)   PRAPARE - Transportation    Lack of Transportation (Medical): No    Lack of Transportation (Non-Medical): No  Physical Activity: Sufficiently Active (05/06/2022)   Exercise Vital Sign    Days of Exercise per Week: 7 days    Minutes of Exercise per Session: 120 min  Stress: No Stress Concern Present (05/06/2022)   [employer] - Occupational Stress Questionnaire    Feeling of Stress : Not at all  Social Connections: Moderately Integrated (05/06/2022)   Social Connection and Isolation Panel [NHANES]    Frequency of Communication with Friends and Family: More than three times a week    Frequency of Social Gatherings with Friends and Family: More than three times a week    Attends Religious Services: More than 4 times per year    Active Member of Clubs or Organizations: No    Attends Club or Organization Meetings: Never    Marital Status: Married     Family History: The patient's family history includes Diabetes in his father; Stroke in his father. There is no history of Colon cancer, Rectal cancer, or Stomach cancer.  ROS:   Please see the history of present illness.    All other systems reviewed and are negative.  EKGs/Labs/Other Studies Reviewed:    The following studies were reviewed today: Cardiac Studies & Procedures   CARDIAC CATHETERIZATION  CARDIAC CATHETERIZATION 07/04/2020  Narrative   Mid RCA to Dist RCA lesion is 100% stenosed.  Mid Cx to Dist Cx lesion is 99% stenosed.  Prox LAD to Mid LAD lesion is 40% stenosed.  Prox Cx to Mid Cx lesion is 30% stenosed.  Findings:  Ao = 109/60 LV = 113/5 RA =  1 RV = 25/1 PA = 24/7 (14) PCW = 7 Fick cardiac output/index = 4.7/2.6 PVR = 1.5 WU FA sat = 99% PA sat = 71%, 76%  Assessment: 1.  2v CAD with total occlusion of mRCA with good L -> R collaterals and high grade lesion in distal AV-groove LCX with L->L collaterals 2. EF 20% 3. Low filling pressures with normal cardiac output  Plan/Discussion:  CAD haas progressed mildly since 2005 but overall not too much change and he is well collateralized. LV dysfunction well out of proportion to CAD. Will need cMRI to full evaluate.  Filling pressures low. May need to cut back lasix a bit.  Daniel Bensimhon, MD 10:34 AM  Findings Coronary Findings Diagnostic  Dominance: Right  Left Anterior Descending Prox LAD to Mid LAD lesion is 40% stenosed.  Left Circumflex Collaterals Dist Cx filled by collaterals from Prox Cx.  Prox Cx to Mid Cx lesion is 30% stenosed. Mid Cx to Dist Cx lesion is 99% stenosed.  Right Coronary Artery Mid RCA to Dist RCA lesion is 100% stenosed. The lesion was previously treated.  Right Posterior Descending Artery Collaterals RPDA filled by collaterals from Dist LAD.  Intervention  No interventions have been documented.   STRESS TESTS  NM MYOCAR MULTI W/SPECT W 10/15/2018  Narrative  Defect 1: There is a large defect of severe severity present in the basal inferoseptal, basal inferior, mid inferoseptal, mid inferior, apical anterior, apical septal and apical inferior location.  This is a high risk study.  Findings consistent with prior myocardial infarction. No ischemic zones.  Nuclear stress EF: 16%.     ECHOCARDIOGRAM  ECHOCARDIOGRAM COMPLETE 05/09/2022  Narrative ECHOCARDIOGRAM REPORT    Patient Name:   Elver H Simar  Date of Exam: 05/09/2022 Medical Rec #:  [id number]    Height:       68.5 in Accession #:    [id number]   Weight:       121.0 lb Date of Birth:  09/02/1940    BSA:          1.660 m Patient Age:    81 years     BP:           104/74 mmHg Patient Gender: M            HR:           105 bpm. Exam Location:  Outpatient  Procedure: 2D Echo, 3D Echo, Cardiac Doppler and Color Doppler  Indications:    I50.9 CHF ( HFrEF)  History:        Patient has prior history of Echocardiogram examinations, most recent 10/04/2021. CHF, CAD, Arrythmias:Atrial Fibrillation; Risk Factors:Hypertension, Dyslipidemia and Former Smoker. HFrEF (prior EF 30-35%), Low Flow- Low Gradient Aortic Stenosis (prior Mean 18mmHG).  Sonographer:    Bethany Mcmahill RDCS Referring Phys: [id number] AMY D CLEGG  IMPRESSIONS   1. Left ventricular ejection fraction, by estimation, is <20%. The left ventricle has severely decreased function. The left ventricle has no regional wall motion abnormalities. The left ventricular internal cavity size was mildly dilated. Left ventricular diastolic function could not be evaluated. 2. Right ventricular systolic function is normal. The right ventricular size is normal. There is moderately elevated pulmonary artery systolic pressure. 3. Left atrial size was severely dilated. 4. Right atrial size was mild to moderately dilated. 5. The mitral valve is degenerative. Mild to moderate mitral valve regurgitation. No evidence of mitral stenosis. 6. Tricuspid valve regurgitation is mild to moderate. 7. The aortic valve is heavily calcified with restricted leaflet movement. There is probable low gradient/low-flow AS with dimensionless index 0.19. The aortic valve is tricuspid. There is severe calcifcation of the aortic valve. Aortic valve regurgitation is mild to moderate. Severe aortic valve stenosis. Aortic valve area, by VTI measures 0.88 cm. Aortic valve mean gradient measures 15.8 mmHg. Aortic valve Vmax  measures 2.74 m/s. 8. Aortic dilatation noted. There is mild dilatation of the ascending aorta, measuring 38 mm. 9. The inferior vena cava is normal in size with greater than 50% respiratory variability, suggesting right atrial pressure of 3 mmHg.  FINDINGS Left Ventricle: Left ventricular ejection fraction, by estimation, is <20%. The left ventricle has severely decreased function. The left ventricle has no regional wall motion abnormalities. The left ventricular internal cavity size was mildly dilated. There is no left ventricular hypertrophy. Left ventricular diastolic function could not be evaluated due to atrial fibrillation. Left ventricular diastolic function could not be evaluated.  Right Ventricle: The right ventricular size is normal. No increase in right ventricular wall thickness. Right ventricular systolic function is normal. There is moderately elevated pulmonary artery systolic pressure. The tricuspid regurgitant velocity is 3.54 m/s, and with an assumed right atrial pressure of 3 mmHg, the estimated right ventricular systolic pressure is 53.1 mmHg.  Left Atrium: Left atrial size was severely dilated.  Right Atrium: Right atrial size was mild to moderately dilated.  Pericardium: There is no evidence of pericardial effusion.  Mitral Valve: The mitral valve is degenerative in appearance. Mild to moderate mitral valve regurgitation. No evidence of mitral valve stenosis.  Tricuspid Valve: The tricuspid valve is normal in   structure. Tricuspid valve regurgitation is mild to moderate. No evidence of tricuspid stenosis.  Aortic Valve: The aortic valve is heavily calcified with restricted leaflet movement. There is probable low gradient/low-flow AS with dimensionless index 0.19. The aortic valve is tricuspid. There is severe calcifcation of the aortic valve. Aortic valve regurgitation is mild to moderate. Aortic regurgitation PHT measures 368 msec. Severe aortic stenosis is present.  Aortic valve mean gradient measures 15.8 mmHg. Aortic valve peak gradient measures 30.1 mmHg. Aortic valve area, by VTI measures 0.88 cm.  Pulmonic Valve: The pulmonic valve was grossly normal. Pulmonic valve regurgitation is trivial. No evidence of pulmonic stenosis.  Aorta: The aortic root is normal in size and structure and aortic dilatation noted. There is mild dilatation of the ascending aorta, measuring 38 mm.  Venous: The inferior vena cava is normal in size with greater than 50% respiratory variability, suggesting right atrial pressure of 3 mmHg.  IAS/Shunts: No atrial level shunt detected by color flow Doppler.   LEFT VENTRICLE PLAX 2D LVIDd:         6.20 cm   Diastology LVIDs:         5.60 cm   LV e' medial:    4.50 cm/s LV PW:         0.90 cm   LV E/e' medial:  22.9 LV IVS:        0.80 cm   LV e' lateral:   6.38 cm/s LVOT diam:     2.60 cm   LV E/e' lateral: 16.1 LV SV:         47 LV SV Index:   28 LVOT Area:     5.31 cm  3D Volume EF: 3D EF:        33 % LV EDV:       210 ml LV ESV:       140 ml LV SV:        70 ml  RIGHT VENTRICLE RV Basal diam:  3.30 cm RV S prime:     9.70 cm/s TAPSE (M-mode): 1.1 cm  LEFT ATRIUM           Index        RIGHT ATRIUM           Index LA diam:      5.90 cm 3.55 cm/m   RA Area:     21.00 cm LA Vol (A4C): 97.8 ml 58.93 ml/m  RA Volume:   60.00 ml  36.15 ml/m AORTIC VALVE AV Area (Vmax):    0.89 cm AV Area (Vmean):   0.91 cm AV Area (VTI):     0.88 cm AV Vmax:           274.20 cm/s AV Vmean:          180.200 cm/s AV VTI:            0.533 m AV Peak Grad:      30.1 mmHg AV Mean Grad:      15.8 mmHg LVOT Vmax:         46.20 cm/s LVOT Vmean:        30.900 cm/s LVOT VTI:          0.088 m LVOT/AV VTI ratio: 0.17 AI PHT:            368 msec  AORTA Ao Root diam: 3.70 cm Ao Asc diam:  3.80 cm  MITRAL VALVE                   TRICUSPID VALVE MV Area (PHT): cm           TR Peak grad:   50.1 mmHg MV Decel Time: 186 msec       TR Vmax:        354.00 cm/s MR Peak grad:   92.2 mmHg MR Mean grad:   52.0 mmHg    SHUNTS MR Vmax:        480.00 cm/s  Systemic VTI:  0.09 m MR Vmean:       336.0 cm/s   Systemic Diam: 2.60 cm MR PISA:        1.57 cm MR PISA Radius: 0.50 cm MV E velocity: 103.00 cm/s MV A velocity: 30.60 cm/s MV E/A ratio:  3.37  Daniel Bensimhon MD Electronically signed by Daniel Bensimhon MD Signature Date/Time: 05/09/2022/3:08:12 PM    Final    MONITORS  LONG TERM MONITOR (3-14 DAYS) 06/08/2022  Narrative Patch Wear Time:  13 days and 17 hours (2023-10-05T16:29:57-0400 to 2023-10-19T09:48:51-0400)  1. Atrial Fibrillation occurred continuously (100% burden), ranging from 36-170 bpm (avg of 76 bpm). 2. Intermittent Bundle Branch Block was present. 3. Three runs of nonsustained Ventricular Tachycardia runs occurred, the run with the fastest interval lasting 10 beats with a max rate of 218 bpm (avg 162 bpm); the run with the fastest interval was also the longest. 4. Occasional PVCs (3.5%, 46490), PVC Couplets were occasional (1.4%, 9582)  Daniel Bensimhon, MD 9:10 PM    CARDIAC MRI  MR CARDIAC MORPHOLOGY W WO CONTRAST 09/17/2022  Narrative CLINICAL DATA:  Cardiomyopathy of uncertain etiology  EXAM: CARDIAC MRI  TECHNIQUE: The patient was scanned on a 1.5 Tesla GE magnet. A dedicated cardiac coil was used. Functional imaging was done using Fiesta sequences. 2,3, and 4 chamber views were done to assess for RWMA's. Modified Simpson's rule using a short axis stack was used to calculate an ejection fraction on a dedicated work station using Circle software. The patient received 8 cc of Gadavist. After 10 minutes inversion recovery sequences were used to assess for infiltration and scar tissue.  CONTRAST:  Gadavist 8 cc  FINDINGS: Images of lung fields show right-sided nodules along with a focus of right lower lobe airspace disease.  Severely dilated left ventricle with normal wall  thickness. Diffuse severe hypokinesis, LV EF 19%. No LV thrombus. Normal right ventricular size with RV EF 32%. Severe left atrial enlargement. Normal right atrium. Thickened aortic valve. The left and noncoronary cusps appear fused, there appears to be at least moderate aortic stenosis visually. Mild aortic insufficiency with regurgitant fraction 13%. Mitral regurgitant fraction 39% with regurgitant volume about 20 cc, moderate MR.  On delayed enhancement imaging, there is > 50% wall thickness subendocardial late gadolinium enhancement (LGE) in the basal to mid inferior wall.  MEASUREMENTS: MEASUREMENTS LVEDV 272 mL  LVEDVi 165 mL/m2  LVSV 51 mL  LVEF 19%  RVEDV 132 mL  RVEDVi 80 mL/m2 RVSV 43 mL  RVEF 32%  Aortic forward volume 31 mL  Aortic regurgitant fraction 12%  T1 1122, ECV 39%  IMPRESSION: 1. Right-sided lung nodules and RLL focal airspace disease. Consider CT chest for further evaluation.  2. Severely dilated LV with normal wall thickness, diffuse hypokinesis with EF 19%.  3.  Normal RV size with RV EF 32%.  4. Aortic stenosis looks moderate visually, would correlate with echo.  5. Moderate functional mitral regurgitation in setting of dilated left ventricle, regurgitant fraction 39% with regurgitant volume 20 cc.  6. Delayed enhancement images   are suggestive of prior myocardial infarction in the RCA territory. No other areas of LGE.  7. Elevated extracellular volume percentage at 39%. This is higher than I would expect with just the inferior scar on delayed enhancement imaging. However, this does not look like cardiac amyloidosis.  Cardiomyopathy out of proportion to prior MI.  Dalton Mclean   Electronically Signed By: Dalton  Mclean M.D. On: 09/17/2022 16:54          EKG:  EKG is ordered today.  The ekg ordered today demonstrates atrial fibrillation, heart rate 99 bpm, nonspecific IVCD, T wave abnormality consider anterolateral  ischemia  Recent Labs: 07/24/2022: ALT 32; Hemoglobin 13.2; Platelets 242.0 10/14/2022: B Natriuretic Peptide 1,190.5; BUN 25; Creatinine, Ser 1.24; Potassium 4.5; Sodium 136  Recent Lipid Panel    Component Value Date/Time   CHOL 101 07/24/2022 1106   TRIG 67.0 07/24/2022 1106   HDL 27.20 (L) 07/24/2022 1106   CHOLHDL 4 07/24/2022 1106   VLDL 13.4 07/24/2022 1106   LDLCALC 60 07/24/2022 1106   LDLCALC 66 07/19/2020 0835   LDLDIRECT 60.0 07/10/2017 0905     Risk Assessment/Calculations:    CHA2DS2-VASc Score = 5   This indicates a 7.2% annual risk of stroke. The patient's score is based upon: CHF History: 1 HTN History: 1 Diabetes History: 0 Stroke History: 0 Vascular Disease History: 1 Age Score: 2 Gender Score: 0               Physical Exam:    VS:  BP 110/70   Pulse 99   Ht 5' 8.5" (1.74 m)   Wt 115 lb (52.2 kg)   SpO2 99%   BMI 17.23 kg/m     Wt Readings from Last 3 Encounters:  11/15/22 115 lb (52.2 kg)  10/14/22 122 lb (55.3 kg)  10/01/22 123 lb 12.8 oz (56.2 kg)     GEN: Thin, elderly male in no distress HEENT: Normal except for poor dentition NECK: No JVD; No carotid bruits LYMPHATICS: No lymphadenopathy CARDIAC: Irregularly irregular, distant heart sounds, 2/6 systolic murmur best heard at the left lower sternal border and apex RESPIRATORY:  Clear to auscultation without rales, wheezing or rhonchi  ABDOMEN: Soft, non-tender, non-distended MUSCULOSKELETAL:  No edema; No deformity  SKIN: Warm and dry NEUROLOGIC:  Alert and oriented x 3 PSYCHIATRIC:  Normal affect   ASSESSMENT:    1. Nonrheumatic aortic valve stenosis   2. Encounter for therapeutic drug monitoring   3. Pre-procedural cardiovascular examination    PLAN:    In order of problems listed above:  The patient has chronic systolic heart failure with severely reduced LVEF, New York Heart Association functional class III symptoms of fatigue and exertional dyspnea.  He had, by his  personal report, an excellent functional capacity 1 year ago when he was able to walk over 20,000 steps in a day.  He has had a marked drop-off in his ability to do physical activities associated with worsening heart failure and possibly aortic stenosis.  I have personally reviewed his echo study which is suggestive of severe, stage D2 aortic stenosis.  The patient's LVEF is severely decreased.  His aortic valve is severely calcified and restricted with peak and mean gradients of 39 and 19 mmHg, respectively.  The dimensionless index is 0.15 and calculated aortic valve area is 0.75.  Today, we had extensive discussion about his treatment options.  His treatment is probably limited to catheter-based therapy such as TAVR or palliative medical therapy.  He   is not in physical condition to undergo any type of major surgery.  The patient expresses very good insight into his health condition with realistic expectations.  Concerns about his comorbidities include the degree of LV dysfunction and extent of heart failure, 40 pound weight loss over the last 4 years with poor nutritional status, and extent of CAD.  We discussed all this at length.  He remains functionally independent and is doing is much as he can even working in the yard still.  He understands the importance of maintaining his weight and he will try to supplement his diet as much as he can.  I discussed his case with Dr. Bensimhon today.  If we are going to explore TAVR, the patient understands that he needs some preprocedural studies to determine whether he would be a candidate.  I have recommended repeat right and left heart cath to best assess his hemodynamics and see if he has any progressive CAD.  In the past he is demonstrated total occlusion of the circumflex and RCA, but has not had flow obstructive disease in the LAD. I have reviewed the risks, indications, and alternatives to cardiac catheterization, possible angioplasty, and stenting with the  patient. Risks include but are not limited to bleeding, infection, vascular injury, stroke, myocardial infection, arrhythmia, kidney injury, radiation-related injury in the case of prolonged fluoroscopy use, emergency cardiac surgery, and death. The patient understands the risks of serious complication is 1-2 in 1000 with diagnostic cardiac cath and 1-2% or less with angioplasty/stenting.  In addition to cardiac catheterization, he will need gated CTA studies of the heart and a CTA of the chest, abdomen, and pelvis to evaluate his access for TAVR.  Once his studies are completed, we will have multidisciplinary review of his case and he will be referred for a formal cardiac surgical consultation if he is felt to be a potential TAVR candidate.  He also understands that he would need his remaining teeth extracted prior to TAVR.  He has extremely poor dentition with only a few remaining partial teeth.  He is somewhat reluctant to have this done, and I think it is entirely reasonable to wait and make sure that he would be a candidate for TAVR, before referring him for dental extraction.   Shared Decision Making/Informed Consent The risks [stroke (1 in 1000), death (1 in 1000), kidney failure [usually temporary] (1 in 500), bleeding (1 in 200), allergic reaction [possibly serious] (1 in 200)], benefits (diagnostic support and management of coronary artery disease) and alternatives of a cardiac catheterization were discussed in detail with Mr. Ancheta and he is willing to proceed.    Medication Adjustments/Labs and Tests Ordered: Current medicines are reviewed at length with the patient today.  Concerns regarding medicines are outlined above.  Orders Placed This Encounter  Procedures   CBC   Basic metabolic panel   Protime-INR   EKG 12-Lead   No orders of the defined types were placed in this encounter.   Patient Instructions  Medication Instructions:  Your physician recommends that you continue on your  current medications as directed. Please refer to the Current Medication list given to you today.  *If you need a refill on your cardiac medications before your next appointment, please call your pharmacy*  Lab Work: CBC, BMET, PT/INR today If you have labs (blood work) drawn today and your tests are completely normal, you will receive your results only by: MyChart Message (if you have MyChart) OR A paper copy in the   mail If you have any lab test that is abnormal or we need to change your treatment, we will call you to review the results.  Testing/Procedures: TAVR CT's (you will be called to schedule)  R & L heart catheterization Your physician has requested that you have a cardiac catheterization. Cardiac catheterization is used to diagnose and/or treat various heart conditions. Doctors may recommend this procedure for a number of different reasons. The most common reason is to evaluate chest pain. Chest pain can be a symptom of coronary artery disease (CAD), and cardiac catheterization can show whether plaque is narrowing or blocking your heart's arteries. This procedure is also used to evaluate the valves, as well as measure the blood flow and oxygen levels in different parts of your heart. For further information please visit [email or web address]. Please follow instruction sheet, as given.  Follow-Up: At [facility], you and your health needs are our priority.  As part of our continuing mission to provide you with exceptional heart care, we have created designated Provider Care Teams.  These Care Teams include your primary Cardiologist (physician) and Advanced Practice Providers (APPs -  Physician Assistants and Nurse Practitioners) who all work together to provide you with the care you need, when you need it.  Your next appointment:   Structural team will follow-up  Provider:   Vasilisa Vore, MD   Other Instructions  **Please drink 2-3 Ensure/Boost supplements per day to  maintain current weight**       Cardiac/Peripheral Catheterization   You are scheduled for a Cardiac Catheterization on Wednesday, April 17 with Dr. Everlynn Sagun.  1. Please arrive at the [facility] (Main Entrance A) at [facility]: [address] [place], [place] [address] at 11:00 AM (This time is two hours before your procedure to ensure your preparation). Free valet parking service is available. You will check in at ADMITTING. The support person will be asked to wait in the waiting room.  It is OK to have someone drop you off and come back when you are ready to be discharged.        Special note: Every effort is made to have your procedure done on time. Please understand that emergencies sometimes delay scheduled procedures.  2. Diet: Do not eat solid foods after midnight.  You may have clear liquids until 5 AM the day of the procedure.  3. Labs: TODAY  4. Medication instructions in preparation for your procedure:   Contrast Allergy: No  DO NOT TAKE Coumadin/Warfarin for 4 days prior to procedure (last dose Friday 4/12, may resume day after)  DO NOT TAKE Furosemide, Potassium, or Spironolactone the day of procedure  On the morning of your procedure, take Aspirin 81 mg and any morning medicines NOT listed above.  You may use sips of water.  5. Plan to go home the same day, you will only stay overnight if medically necessary. 6. You MUST have a responsible adult to drive you home. 7. An adult MUST be with you the first 24 hours after you arrive home. 8. Bring a current list of your medications, and the last time and date medication taken. 9. Bring ID and current insurance cards. 10.Please wear clothes that are easy to get on and off and wear slip-on shoes.  Thank you for allowing us to care for you!   -- [facility]    Signed, Frankee Gritz, MD  11/15/2022 11:10 AM    [facility] 

## 2022-11-15 NOTE — Patient Instructions (Addendum)
Medication Instructions:  Your physician recommends that you continue on your current medications as directed. Please refer to the Current Medication list given to you today.  *If you need a refill on your cardiac medications before your next appointment, please call your pharmacy*  Lab Work: CBC, BMET, PT/INR today If you have labs (blood work) drawn today and your tests are completely normal, you will receive your results only by: MyChart Message (if you have MyChart) OR A paper copy in the mail If you have any lab test that is abnormal or we need to change your treatment, we will call you to review the results.  Testing/Procedures: TAVR CT's (you will be called to schedule)  R & L heart catheterization Your physician has requested that you have a cardiac catheterization. Cardiac catheterization is used to diagnose and/or treat various heart conditions. Doctors may recommend this procedure for a number of different reasons. The most common reason is to evaluate chest pain. Chest pain can be a symptom of coronary artery disease (CAD), and cardiac catheterization can show whether plaque is narrowing or blocking your heart's arteries. This procedure is also used to evaluate the valves, as well as measure the blood flow and oxygen levels in different parts of your heart. For further information please visit [email or web address] Please follow instruction sheet, as given.  Follow-Up: At [facility], you and your health needs are our priority.  As part of our continuing mission to provide you with exceptional heart care, we have created designated Provider Care Teams.  These Care Teams include your primary Cardiologist (physician) and Advanced Practice Providers (APPs -  Physician Assistants and Nurse Practitioners) who all work together to provide you with the care you need, when you need it.  Your next appointment:   Structural team will follow-up  Provider:   Tonny Bollman, MD    Other Instructions  **Please drink 2-3 Ensure/Boost supplements per day to maintain current weight**       Cardiac/Peripheral Catheterization   You are scheduled for a Cardiac Catheterization on Wednesday, April 17 with Dr. Tonny Bollman.  1. Please arrive at the [facility] (Main Entrance A) at [facility]: [address] [place], [place] [address] at 11:00 AM (This time is two hours before your procedure to ensure your preparation). Free valet parking service is available. You will check in at ADMITTING. The support person will be asked to wait in the waiting room.  It is OK to have someone drop you off and come back when you are ready to be discharged.        Special note: Every effort is made to have your procedure done on time. Please understand that emergencies sometimes delay scheduled procedures.  2. Diet: Do not eat solid foods after midnight.  You may have clear liquids until 5 AM the day of the procedure.  3. Labs: TODAY  4. Medication instructions in preparation for your procedure:   Contrast Allergy: No  DO NOT TAKE Coumadin/Warfarin for 4 days prior to procedure (last dose Friday 4/12, may resume day after)  DO NOT TAKE Furosemide, Potassium, or Spironolactone the day of procedure  On the morning of your procedure, take Aspirin 81 mg and any morning medicines NOT listed above.  You may use sips of water.  5. Plan to go home the same day, you will only stay overnight if medically necessary. 6. You MUST have a responsible adult to drive you home. 7. An adult MUST be with  you the first 24 hours after you arrive home. 8. Bring a current list of your medications, and the last time and date medication taken. 9. Bring ID and current insurance cards. 10.Please wear clothes that are easy to get on and off and wear slip-on shoes.  Thank you for allowing [place] to care for you!   -- [facility]

## 2022-11-16 LAB — CBC
Hematocrit: 44.3 % (ref 37.5–51.0)
Hemoglobin: 14.2 g/dL (ref 13.0–17.7)
MCH: 30.7 pg (ref 26.6–33.0)
MCHC: 32.1 g/dL (ref 31.5–35.7)
MCV: 96 fL (ref 79–97)
Platelets: 161 10*3/uL (ref 150–450)
RBC: 4.63 x10E6/uL (ref 4.14–5.80)
RDW: 12.7 % (ref 11.6–15.4)
WBC: 6.4 10*3/uL (ref 3.4–10.8)

## 2022-11-16 LAB — BASIC METABOLIC PANEL
BUN/Creatinine Ratio: 19 (ref 10–24)
BUN: 28 mg/dL — ABNORMAL HIGH (ref 8–27)
CO2: 23 mmol/L (ref 20–29)
Calcium: 9.3 mg/dL (ref 8.6–10.2)
Chloride: 100 mmol/L (ref 96–106)
Creatinine, Ser: 1.45 mg/dL — ABNORMAL HIGH (ref 0.76–1.27)
Glucose: 84 mg/dL (ref 70–99)
Potassium: 4.7 mmol/L (ref 3.5–5.2)
Sodium: 140 mmol/L (ref 134–144)
eGFR: 48 mL/min/{1.73_m2} — ABNORMAL LOW (ref >59)

## 2022-11-18 ENCOUNTER — Telehealth: Payer: Self-pay | Admitting: Family Medicine

## 2022-11-18 NOTE — Telephone Encounter (Signed)
See below

## 2022-11-18 NOTE — Telephone Encounter (Signed)
2:20 on 11/26/22 please

## 2022-11-18 NOTE — Telephone Encounter (Signed)
Caller states patient has been having difficulty swallowing, specially pills. States his specialists have recommended speaking to PCP about this. PCP not available for a month. Caller requests a call back @ [phone].

## 2022-11-18 NOTE — Telephone Encounter (Signed)
Do you want to work pt in for this?

## 2022-11-19 ENCOUNTER — Telehealth: Payer: Self-pay | Admitting: *Deleted

## 2022-11-19 NOTE — Telephone Encounter (Signed)
Per Dr Herminio Commons fluids, no extra hydration for cath 11/20/22

## 2022-11-19 NOTE — Telephone Encounter (Signed)
Pt was scheduled by Lorene Dy for 11/26/22 @ 4:20pm.

## 2022-11-19 NOTE — Telephone Encounter (Signed)
Cardiac Catheterization scheduled at [facility] for: Wednesday November 20, 2022 1 PM Arrival time [facility] Main Entrance A at:  11 AM  Nothing to eat after midnight prior to procedure, clear liquids until 5 AM day of procedure.  Medication instructions: -Hold:  Spironolactone/Lasix/KCl-day before and day of procedure-day before and day of procedure-per protocol GFR 48-will hold AM of procedure-pt feels needs PM dose lasix prior to procedure  Warfarin-last dose 11/15/22 until post procedure  Farxiga-AM of procedure -Other usual morning medications can be taken with sips of water including aspirin 81 mg.  Confirmed patient has responsible adult to drive home post procedure and be with patient first 24 hours after arriving home.  Plan to go home the same day, you will only stay overnight if medically necessary.  Reviewed procedure instructions with patient's wife (at patient's request).

## 2022-11-19 NOTE — Telephone Encounter (Signed)
Hey! Dr. Durene Cal said 2:20pm for this pt, was he not able to come at 2:20?

## 2022-11-20 ENCOUNTER — Other Ambulatory Visit: Payer: Self-pay

## 2022-11-20 ENCOUNTER — Ambulatory Visit (HOSPITAL_COMMUNITY)
Admission: RE | Admit: 2022-11-20 | Discharge: 2022-11-20 | Disposition: A | Discharge status: Home / Self Care | Payer: Medicare Other | Attending: Cardiovascular Disease | Admitting: Cardiovascular Disease

## 2022-11-20 ENCOUNTER — Encounter (HOSPITAL_COMMUNITY)
Admission: RE | Disposition: A | Discharge status: Home / Self Care | Payer: Self-pay | Source: Home / Self Care | Attending: Cardiovascular Disease

## 2022-11-20 DIAGNOSIS — I5022 Chronic systolic (congestive) heart failure: Secondary | ICD-10-CM | POA: Diagnosis not present

## 2022-11-20 DIAGNOSIS — Z87891 Personal history of nicotine dependence: Secondary | ICD-10-CM | POA: Diagnosis not present

## 2022-11-20 DIAGNOSIS — Z7901 Long term (current) use of anticoagulants: Secondary | ICD-10-CM

## 2022-11-20 DIAGNOSIS — Z955 Presence of coronary angioplasty implant and graft: Secondary | ICD-10-CM | POA: Diagnosis not present

## 2022-11-20 DIAGNOSIS — I2582 Chronic total occlusion of coronary artery: Secondary | ICD-10-CM | POA: Diagnosis not present

## 2022-11-20 DIAGNOSIS — I350 Nonrheumatic aortic (valve) stenosis: Secondary | ICD-10-CM

## 2022-11-20 DIAGNOSIS — I428 Other cardiomyopathies: Secondary | ICD-10-CM | POA: Insufficient documentation

## 2022-11-20 DIAGNOSIS — I2510 Atherosclerotic heart disease of native coronary artery without angina pectoris: Secondary | ICD-10-CM | POA: Diagnosis not present

## 2022-11-20 DIAGNOSIS — Z01812 Encounter for preprocedural laboratory examination: Secondary | ICD-10-CM

## 2022-11-20 HISTORY — PX: RIGHT/LEFT HEART CATH AND CORONARY ANGIOGRAPHY: CATH118266

## 2022-11-20 LAB — POCT I-STAT 7, (LYTES, BLD GAS, ICA,H+H)
Acid-Base Excess: 0 mmol/L (ref 0.0–2.0)
Bicarbonate: 25.4 mmol/L (ref 20.0–28.0)
Calcium, Ion: 1.21 mmol/L (ref 1.15–1.40)
HCT: 40 % (ref 39.0–52.0)
Hemoglobin: 13.6 g/dL (ref 13.0–17.0)
O2 Saturation: 96 %
Potassium: 4.7 mmol/L (ref 3.5–5.1)
Sodium: 136 mmol/L (ref 135–145)
TCO2: 27 mmol/L (ref 22–32)
pCO2 arterial: 42 mmHg (ref 32–48)
pH, Arterial: 7.39 (ref 7.35–7.45)
pO2, Arterial: 83 mmHg (ref 83–108)

## 2022-11-20 LAB — POCT I-STAT EG7
Acid-Base Excess: 1 mmol/L (ref 0.0–2.0)
Acid-Base Excess: 1 mmol/L (ref 0.0–2.0)
Bicarbonate: 27.5 mmol/L (ref 20.0–28.0)
Bicarbonate: 27.8 mmol/L (ref 20.0–28.0)
Calcium, Ion: 1.13 mmol/L — ABNORMAL LOW (ref 1.15–1.40)
Calcium, Ion: 1.21 mmol/L (ref 1.15–1.40)
HCT: 40 % (ref 39.0–52.0)
HCT: 41 % (ref 39.0–52.0)
Hemoglobin: 13.6 g/dL (ref 13.0–17.0)
Hemoglobin: 13.9 g/dL (ref 13.0–17.0)
O2 Saturation: 61 %
O2 Saturation: 61 %
Potassium: 4.4 mmol/L (ref 3.5–5.1)
Potassium: 4.7 mmol/L (ref 3.5–5.1)
Sodium: 138 mmol/L (ref 135–145)
Sodium: 137 mmol/L (ref 135–145)
TCO2: 29 mmol/L (ref 22–32)
TCO2: 29 mmol/L (ref 22–32)
pCO2, Ven: 49.2 mmHg (ref 44–60)
pCO2, Ven: 50.6 mmHg (ref 44–60)
pH, Ven: 7.355 (ref 7.25–7.43)
pH, Ven: 7.349 (ref 7.25–7.43)
pO2, Ven: 34 mmHg (ref 32–45)
pO2, Ven: 34 mmHg (ref 32–45)

## 2022-11-20 LAB — PROTIME-INR
INR: 1.4 — ABNORMAL HIGH (ref 0.8–1.2)
Prothrombin Time: 16.8 seconds — ABNORMAL HIGH (ref 11.4–15.2)

## 2022-11-20 SURGERY — RIGHT/LEFT HEART CATH AND CORONARY ANGIOGRAPHY
Anesthesia: LOCAL

## 2022-11-20 MED ORDER — ONDANSETRON HCL 4 MG/2ML IJ SOLN: 4.0000 mg | Freq: Four times a day (QID) | INTRAMUSCULAR | Status: DC | PRN

## 2022-11-20 MED ORDER — SODIUM CHLORIDE 0.9% FLUSH: 3.0000 mL | Freq: Two times a day (BID) | INTRAVENOUS | Status: DC

## 2022-11-20 MED ORDER — HEPARIN (PORCINE) IN NACL 1000-0.9 UT/500ML-% IV SOLN
INTRAVENOUS | Status: DC | PRN
  Administered 2022-11-20 (×2): 500 mL

## 2022-11-20 MED ORDER — SODIUM CHLORIDE 0.9 % IV SOLN: 250.0000 mL | INTRAVENOUS | Status: DC | PRN

## 2022-11-20 MED ORDER — IOHEXOL 350 MG/ML SOLN
INTRAVENOUS | Status: DC | PRN
  Administered 2022-11-20: 35 mL

## 2022-11-20 MED ORDER — SODIUM CHLORIDE 0.9% FLUSH: 3.0000 mL | INTRAVENOUS | Status: DC | PRN

## 2022-11-20 MED ORDER — VERAPAMIL HCL 2.5 MG/ML IV SOLN
INTRAVENOUS | Status: DC | PRN
  Administered 2022-11-20: 10 mL via INTRA_ARTERIAL

## 2022-11-20 MED ORDER — SODIUM CHLORIDE 0.9 % WEIGHT BASED INFUSION
3.0000 mL/kg/h | INTRAVENOUS | Status: AC
  Administered 2022-11-20: 3 mL/kg/h via INTRAVENOUS

## 2022-11-20 MED ORDER — HEPARIN SODIUM (PORCINE) 1000 UNIT/ML IJ SOLN
INTRAMUSCULAR | Status: DC | PRN
  Administered 2022-11-20: 3000 [IU] via INTRAVENOUS

## 2022-11-20 MED ORDER — SODIUM CHLORIDE 0.9 % WEIGHT BASED INFUSION: 1.0000 mL/kg/h | INTRAVENOUS | Status: DC

## 2022-11-20 MED ORDER — ASPIRIN 81 MG PO CHEW: 81.0000 mg | CHEWABLE_TABLET | ORAL | Status: DC

## 2022-11-20 MED ORDER — ACETAMINOPHEN 325 MG PO TABS: 650.0000 mg | ORAL_TABLET | ORAL | Status: DC | PRN

## 2022-11-20 MED ORDER — LABETALOL HCL 5 MG/ML IV SOLN: 10.0000 mg | INTRAVENOUS | Status: DC | PRN

## 2022-11-20 MED ORDER — HYDRALAZINE HCL 20 MG/ML IJ SOLN: 10.0000 mg | INTRAMUSCULAR | Status: DC | PRN

## 2022-11-20 MED ORDER — VERAPAMIL HCL 2.5 MG/ML IV SOLN
INTRAVENOUS | Status: AC
  Filled 2022-11-20: qty 2

## 2022-11-20 MED ORDER — LIDOCAINE HCL (PF) 1 % IJ SOLN
INTRAMUSCULAR | Status: AC
  Filled 2022-11-20: qty 30

## 2022-11-20 MED ORDER — MIDAZOLAM HCL 2 MG/2ML IJ SOLN
INTRAMUSCULAR | Status: AC
  Filled 2022-11-20: qty 2

## 2022-11-20 MED ORDER — LIDOCAINE HCL (PF) 1 % IJ SOLN
INTRAMUSCULAR | Status: DC | PRN
  Administered 2022-11-20 (×2): 5 mL via INTRADERMAL

## 2022-11-20 MED ORDER — MIDAZOLAM HCL 2 MG/2ML IJ SOLN
INTRAMUSCULAR | Status: DC | PRN
  Administered 2022-11-20: .5 mg via INTRAVENOUS

## 2022-11-20 MED ORDER — HEPARIN SODIUM (PORCINE) 1000 UNIT/ML IJ SOLN
INTRAMUSCULAR | Status: AC
  Filled 2022-11-20: qty 10

## 2022-11-20 MED ORDER — FENTANYL CITRATE (PF) 100 MCG/2ML IJ SOLN
INTRAMUSCULAR | Status: AC
  Filled 2022-11-20: qty 2

## 2022-11-20 MED ORDER — FENTANYL CITRATE (PF) 100 MCG/2ML IJ SOLN
INTRAMUSCULAR | Status: DC | PRN
  Administered 2022-11-20: 25 ug via INTRAVENOUS

## 2022-11-20 SURGICAL SUPPLY — 16 items
CATH 5FR JL3.5 JR4 ANG PIG MP (CATHETERS) IMPLANT
CATH BALLN WEDGE 5F 110CM (CATHETERS) IMPLANT
CATH INFINITI 5 FR AL2 (CATHETERS) IMPLANT
CATH INFINITI 5FR AL1 (CATHETERS) IMPLANT
DEVICE RAD TR BAND REGULAR (VASCULAR PRODUCTS) IMPLANT
GLIDESHEATH SLEND SS 6F .021 (SHEATH) IMPLANT
GUIDEWIRE INQWIRE 1.5J.035X260 (WIRE) IMPLANT
INQWIRE 1.5J .035X260CM (WIRE) ×1
KIT HEART LEFT (KITS) ×1 IMPLANT
PACK CARDIAC CATHETERIZATION (CUSTOM PROCEDURE TRAY) ×1 IMPLANT
SHEATH GLIDE SLENDER 4/5FR (SHEATH) IMPLANT
SHEATH PROBE COVER 6X72 (BAG) IMPLANT
TRANSDUCER W/STOPCOCK (MISCELLANEOUS) ×1 IMPLANT
TUBING CIL FLEX 10 FLL-RA (TUBING) ×1 IMPLANT
WIRE EMERALD 3MM-J .025X260CM (WIRE) IMPLANT
WIRE EMERALD ST .035X260CM (WIRE) IMPLANT

## 2022-11-20 NOTE — Interval H&P Note (Signed)
History and Physical Interval Note:  11/20/2022 1:05 PM  Martin James  has presented today for surgery, with the diagnosis of aortic stenosis.  The various methods of treatment have been discussed with the patient and family. After consideration of risks, benefits and other options for treatment, the patient has consented to  Procedure(s): RIGHT/LEFT HEART CATH AND CORONARY ANGIOGRAPHY (N/A) as a surgical intervention.  The patient's history has been reviewed, patient examined, no change in status, stable for surgery.  I have reviewed the patient's chart and labs.  Questions were answered to the patient's satisfaction.     Tonny Bollman

## 2022-11-20 NOTE — Progress Notes (Signed)
TR BAND REMOVAL  LOCATION:    right radial  DEFLATED PER PROTOCOL:    Yes.    TIME BAND OFF / DRESSING APPLIED:    1540 gauze dressing applied    SITE UPON ARRIVAL:    Level 0  SITE AFTER BAND REMOVAL:    Level 0  CIRCULATION SENSATION AND MOVEMENT:    Within Normal Limits   Yes.    COMMENTS:   no issues noted

## 2022-11-20 NOTE — Discharge Instructions (Signed)

## 2022-11-22 ENCOUNTER — Encounter (HOSPITAL_COMMUNITY): Payer: Self-pay | Admitting: Cardiovascular Disease

## 2022-11-25 ENCOUNTER — Telehealth: Payer: Self-pay | Admitting: Cardiology

## 2022-11-25 DIAGNOSIS — I469 Cardiac arrest, cause unspecified: Secondary | ICD-10-CM | POA: Diagnosis not present

## 2022-11-25 DIAGNOSIS — Z743 Need for continuous supervision: Secondary | ICD-10-CM | POA: Diagnosis not present

## 2022-11-25 DIAGNOSIS — I499 Cardiac arrhythmia, unspecified: Secondary | ICD-10-CM | POA: Diagnosis not present

## 2022-11-25 DIAGNOSIS — I4901 Ventricular fibrillation: Secondary | ICD-10-CM | POA: Diagnosis not present

## 2022-11-25 DIAGNOSIS — R404 Transient alteration of awareness: Secondary | ICD-10-CM | POA: Diagnosis not present

## 2022-11-25 NOTE — Telephone Encounter (Signed)
Wife is calling to let us know the patient has passed away.

## 2022-11-26 ENCOUNTER — Ambulatory Visit: Payer: Medicare Other | Admitting: Family Medicine

## 2022-11-26 NOTE — Telephone Encounter (Signed)
I contacted the pt's wife to offer my condolences.  She states that after the pt's cardiac cath on Wednesday the patient felt weak for a few days.  Yesterday the pt felt stronger and he went outside to mow the yard on his riding [employer].  She heard the lawn mower cut off suddenly and when she looked outside the pt was slumped over the wheel. She said the patient enjoyed mowing the yard and based on discussions with Dr Gala Romney they were aware that something could happen suddenly due to his heart issues.  I advised her that I will make the cardiology team aware of the pt's passing.

## 2022-11-28 ENCOUNTER — Ambulatory Visit (HOSPITAL_COMMUNITY): Payer: Medicare Other

## 2022-12-19 ENCOUNTER — Encounter (HOSPITAL_COMMUNITY): Payer: Medicare Other | Admitting: Internal Medicine

## 2023-01-15 ENCOUNTER — Ambulatory Visit: Payer: Medicare Other | Admitting: Cardiology

## 2023-01-23 ENCOUNTER — Ambulatory Visit: Payer: Medicare Other | Admitting: Family Medicine
